# Patient Record
Sex: Female | Born: 1955 | Race: White | Hispanic: No | Marital: Married | Smoking: Current every day smoker
Health system: Southern US, Community
[De-identification: ages and names within clinical notes are randomized; demographics above are authoritative.]

## PROBLEM LIST (undated history)

## (undated) DIAGNOSIS — F17200 Nicotine dependence, unspecified, uncomplicated: Secondary | ICD-10-CM

## (undated) DIAGNOSIS — G709 Myoneural disorder, unspecified: Secondary | ICD-10-CM

## (undated) DIAGNOSIS — K529 Noninfective gastroenteritis and colitis, unspecified: Secondary | ICD-10-CM

## (undated) DIAGNOSIS — I81 Portal vein thrombosis: Secondary | ICD-10-CM

## (undated) DIAGNOSIS — C189 Malignant neoplasm of colon, unspecified: Secondary | ICD-10-CM

## (undated) DIAGNOSIS — Z8719 Personal history of other diseases of the digestive system: Secondary | ICD-10-CM

## (undated) DIAGNOSIS — F419 Anxiety disorder, unspecified: Secondary | ICD-10-CM

## (undated) DIAGNOSIS — N189 Chronic kidney disease, unspecified: Secondary | ICD-10-CM

## (undated) DIAGNOSIS — C55 Malignant neoplasm of uterus, part unspecified: Secondary | ICD-10-CM

## (undated) DIAGNOSIS — C801 Malignant (primary) neoplasm, unspecified: Secondary | ICD-10-CM

## (undated) DIAGNOSIS — R569 Unspecified convulsions: Secondary | ICD-10-CM

## (undated) DIAGNOSIS — F329 Major depressive disorder, single episode, unspecified: Secondary | ICD-10-CM

## (undated) DIAGNOSIS — D649 Anemia, unspecified: Secondary | ICD-10-CM

## (undated) DIAGNOSIS — F32A Depression, unspecified: Secondary | ICD-10-CM

## (undated) DIAGNOSIS — I10 Essential (primary) hypertension: Secondary | ICD-10-CM

## (undated) HISTORY — DX: Unspecified convulsions: R56.9

## (undated) HISTORY — DX: Myoneural disorder, unspecified: G70.9

## (undated) HISTORY — DX: Malignant (primary) neoplasm, unspecified: C80.1

## (undated) HISTORY — DX: Essential (primary) hypertension: I10

## (undated) HISTORY — PX: TONSILLECTOMY: SUR1361

## (undated) HISTORY — DX: Nicotine dependence, unspecified, uncomplicated: F17.200

---

## 1986-12-18 HISTORY — PX: ABDOMINAL HYSTERECTOMY: SHX81

## 2003-01-02 ENCOUNTER — Encounter: Payer: Self-pay | Admitting: Family Medicine

## 2003-01-02 ENCOUNTER — Ambulatory Visit (HOSPITAL_COMMUNITY): Admission: RE | Admit: 2003-01-02 | Discharge: 2003-01-02 | Payer: Self-pay | Admitting: Family Medicine

## 2003-02-03 ENCOUNTER — Encounter: Payer: Self-pay | Admitting: Emergency Medicine

## 2003-02-03 ENCOUNTER — Emergency Department (HOSPITAL_COMMUNITY): Admission: EM | Admit: 2003-02-03 | Discharge: 2003-02-03 | Payer: Self-pay | Admitting: Emergency Medicine

## 2004-12-18 HISTORY — PX: TUNNELED VENOUS PORT PLACEMENT: SHX819

## 2004-12-18 HISTORY — PX: COLON SURGERY: SHX602

## 2005-04-19 ENCOUNTER — Other Ambulatory Visit: Admission: RE | Admit: 2005-04-19 | Discharge: 2005-04-19 | Payer: Self-pay | Admitting: Family Medicine

## 2005-05-12 ENCOUNTER — Encounter: Admission: RE | Admit: 2005-05-12 | Discharge: 2005-05-12 | Payer: Self-pay | Admitting: Gastroenterology

## 2005-05-25 ENCOUNTER — Encounter (INDEPENDENT_AMBULATORY_CARE_PROVIDER_SITE_OTHER): Payer: Self-pay | Admitting: *Deleted

## 2005-05-25 ENCOUNTER — Inpatient Hospital Stay (HOSPITAL_COMMUNITY): Admission: RE | Admit: 2005-05-25 | Discharge: 2005-05-30 | Payer: Self-pay | Admitting: General Surgery

## 2005-06-16 ENCOUNTER — Ambulatory Visit: Payer: Self-pay | Admitting: Hematology and Oncology

## 2005-07-05 ENCOUNTER — Ambulatory Visit (HOSPITAL_BASED_OUTPATIENT_CLINIC_OR_DEPARTMENT_OTHER): Admission: RE | Admit: 2005-07-05 | Discharge: 2005-07-05 | Payer: Self-pay | Admitting: General Surgery

## 2005-07-05 ENCOUNTER — Ambulatory Visit (HOSPITAL_COMMUNITY): Admission: RE | Admit: 2005-07-05 | Discharge: 2005-07-05 | Payer: Self-pay | Admitting: General Surgery

## 2005-07-06 ENCOUNTER — Ambulatory Visit (HOSPITAL_COMMUNITY): Admission: RE | Admit: 2005-07-06 | Discharge: 2005-07-06 | Payer: Self-pay | Admitting: Hematology and Oncology

## 2005-08-07 ENCOUNTER — Ambulatory Visit: Payer: Self-pay | Admitting: Hematology and Oncology

## 2005-10-02 ENCOUNTER — Ambulatory Visit: Payer: Self-pay | Admitting: Hematology and Oncology

## 2005-11-24 ENCOUNTER — Ambulatory Visit: Payer: Self-pay | Admitting: Hematology and Oncology

## 2006-01-09 ENCOUNTER — Ambulatory Visit: Payer: Self-pay | Admitting: Hematology and Oncology

## 2006-03-14 ENCOUNTER — Ambulatory Visit: Payer: Self-pay | Admitting: Hematology and Oncology

## 2006-03-23 LAB — CBC WITH DIFFERENTIAL/PLATELET
BASO%: 1.5 % (ref 0.0–2.0)
Basophils Absolute: 0.1 10*3/uL (ref 0.0–0.1)
EOS%: 5.4 % (ref 0.0–7.0)
Eosinophils Absolute: 0.3 10*3/uL (ref 0.0–0.5)
HCT: 43.6 % (ref 34.8–46.6)
HGB: 15.3 g/dL (ref 11.6–15.9)
LYMPH%: 38.9 % (ref 14.0–48.0)
MCH: 34.5 pg — ABNORMAL HIGH (ref 26.0–34.0)
MCHC: 35.1 g/dL (ref 32.0–36.0)
MCV: 98.3 fL (ref 81.0–101.0)
MONO#: 0.8 10*3/uL (ref 0.1–0.9)
MONO%: 13.4 % — ABNORMAL HIGH (ref 0.0–13.0)
NEUT#: 2.3 10*3/uL (ref 1.5–6.5)
NEUT%: 40.8 % (ref 39.6–76.8)
Platelets: 270 10*3/uL (ref 145–400)
RBC: 4.43 10*6/uL (ref 3.70–5.32)
RDW: 18.4 % — ABNORMAL HIGH (ref 11.3–14.5)
WBC: 5.7 10*3/uL (ref 3.9–10.0)
lymph#: 2.2 10*3/uL (ref 0.9–3.3)

## 2006-03-23 LAB — COMPREHENSIVE METABOLIC PANEL
ALT: 65 U/L — ABNORMAL HIGH (ref 0–40)
Albumin: 4.4 g/dL (ref 3.5–5.2)
CO2: 24 mEq/L (ref 19–32)
Calcium: 9.3 mg/dL (ref 8.4–10.5)
Chloride: 100 mEq/L (ref 96–112)
Potassium: 3.4 mEq/L — ABNORMAL LOW (ref 3.5–5.3)
Sodium: 135 mEq/L (ref 135–145)
Total Protein: 6.8 g/dL (ref 6.0–8.3)

## 2006-03-23 LAB — CEA: CEA: 2.3 ng/mL (ref 0.0–5.0)

## 2006-03-30 ENCOUNTER — Ambulatory Visit (HOSPITAL_COMMUNITY): Admission: RE | Admit: 2006-03-30 | Discharge: 2006-03-30 | Payer: Self-pay | Admitting: Hematology and Oncology

## 2006-05-02 ENCOUNTER — Ambulatory Visit: Payer: Self-pay | Admitting: Hematology and Oncology

## 2006-05-08 ENCOUNTER — Emergency Department (HOSPITAL_COMMUNITY): Admission: EM | Admit: 2006-05-08 | Discharge: 2006-05-08 | Payer: Self-pay | Admitting: Emergency Medicine

## 2006-06-12 ENCOUNTER — Ambulatory Visit: Payer: Self-pay | Admitting: Hematology and Oncology

## 2006-07-16 ENCOUNTER — Ambulatory Visit: Payer: Self-pay | Admitting: Hematology and Oncology

## 2006-08-15 LAB — CBC WITH DIFFERENTIAL/PLATELET
BASO%: 0.5 % (ref 0.0–2.0)
Eosinophils Absolute: 0.3 10*3/uL (ref 0.0–0.5)
HCT: 42.2 % (ref 34.8–46.6)
LYMPH%: 23 % (ref 14.0–48.0)
MCHC: 35.4 g/dL (ref 32.0–36.0)
MCV: 103.1 fL — ABNORMAL HIGH (ref 81.0–101.0)
MONO%: 8.1 % (ref 0.0–13.0)
NEUT%: 63 % (ref 39.6–76.8)
Platelets: 246 10*3/uL (ref 145–400)
RBC: 4.1 10*6/uL (ref 3.70–5.32)

## 2006-08-15 LAB — COMPREHENSIVE METABOLIC PANEL
Albumin: 4.8 g/dL (ref 3.5–5.2)
BUN: 11 mg/dL (ref 6–23)
CO2: 26 mEq/L (ref 19–32)
Glucose, Bld: 123 mg/dL — ABNORMAL HIGH (ref 70–99)
Sodium: 137 mEq/L (ref 135–145)
Total Bilirubin: 0.9 mg/dL (ref 0.3–1.2)
Total Protein: 7.5 g/dL (ref 6.0–8.3)

## 2006-08-15 LAB — CEA: CEA: 2.6 ng/mL (ref 0.0–5.0)

## 2006-11-01 ENCOUNTER — Ambulatory Visit: Payer: Self-pay | Admitting: Hematology and Oncology

## 2006-11-05 LAB — CBC WITH DIFFERENTIAL/PLATELET
BASO%: 0.5 % (ref 0.0–2.0)
EOS%: 2.7 % (ref 0.0–7.0)
Eosinophils Absolute: 0.3 10*3/uL (ref 0.0–0.5)
LYMPH%: 19.2 % (ref 14.0–48.0)
MCH: 35 pg — ABNORMAL HIGH (ref 26.0–34.0)
MCHC: 34.9 g/dL (ref 32.0–36.0)
MCV: 100.4 fL (ref 81.0–101.0)
MONO%: 11.9 % (ref 0.0–13.0)
NEUT#: 7.3 10*3/uL — ABNORMAL HIGH (ref 1.5–6.5)
RBC: 4.13 10*6/uL (ref 3.70–5.32)
RDW: 14.9 % — ABNORMAL HIGH (ref 11.3–14.5)

## 2006-11-05 LAB — COMPREHENSIVE METABOLIC PANEL
ALT: 28 U/L (ref 0–35)
AST: 26 U/L (ref 0–37)
Albumin: 4.7 g/dL (ref 3.5–5.2)
Alkaline Phosphatase: 249 U/L — ABNORMAL HIGH (ref 39–117)
Potassium: 3.2 mEq/L — ABNORMAL LOW (ref 3.5–5.3)
Sodium: 136 mEq/L (ref 135–145)
Total Bilirubin: 0.7 mg/dL (ref 0.3–1.2)
Total Protein: 7.5 g/dL (ref 6.0–8.3)

## 2006-12-22 ENCOUNTER — Emergency Department (HOSPITAL_COMMUNITY): Admission: EM | Admit: 2006-12-22 | Discharge: 2006-12-23 | Payer: Self-pay | Admitting: Emergency Medicine

## 2006-12-28 ENCOUNTER — Ambulatory Visit (HOSPITAL_COMMUNITY): Admission: RE | Admit: 2006-12-28 | Discharge: 2006-12-28 | Payer: Self-pay | Admitting: Emergency Medicine

## 2007-01-25 ENCOUNTER — Encounter: Admission: RE | Admit: 2007-01-25 | Discharge: 2007-01-25 | Payer: Self-pay | Admitting: Neurology

## 2007-04-05 ENCOUNTER — Ambulatory Visit: Payer: Self-pay | Admitting: Hematology and Oncology

## 2007-04-16 ENCOUNTER — Inpatient Hospital Stay (HOSPITAL_COMMUNITY): Admission: EM | Admit: 2007-04-16 | Discharge: 2007-04-17 | Payer: Self-pay | Admitting: Emergency Medicine

## 2008-10-06 ENCOUNTER — Ambulatory Visit (HOSPITAL_COMMUNITY): Admission: RE | Admit: 2008-10-06 | Discharge: 2008-10-06 | Payer: Self-pay | Admitting: Family Medicine

## 2009-02-16 ENCOUNTER — Emergency Department (HOSPITAL_COMMUNITY): Admission: EM | Admit: 2009-02-16 | Discharge: 2009-02-16 | Payer: Self-pay | Admitting: Emergency Medicine

## 2009-03-11 ENCOUNTER — Ambulatory Visit: Payer: Self-pay | Admitting: Hematology and Oncology

## 2009-03-11 LAB — CBC WITH DIFFERENTIAL/PLATELET
Basophils Absolute: 0.1 10*3/uL (ref 0.0–0.1)
Eosinophils Absolute: 0.6 10*3/uL — ABNORMAL HIGH (ref 0.0–0.5)
HGB: 14.7 g/dL (ref 11.6–15.9)
LYMPH%: 20 % (ref 14.0–49.7)
MONO#: 1.4 10*3/uL — ABNORMAL HIGH (ref 0.1–0.9)
NEUT#: 6.7 10*3/uL — ABNORMAL HIGH (ref 1.5–6.5)
Platelets: 208 10*3/uL (ref 145–400)
RBC: 4.43 10*6/uL (ref 3.70–5.45)
WBC: 10.9 10*3/uL — ABNORMAL HIGH (ref 3.9–10.3)

## 2009-03-11 LAB — COMPREHENSIVE METABOLIC PANEL
AST: 73 U/L — ABNORMAL HIGH (ref 0–37)
Albumin: 4.4 g/dL (ref 3.5–5.2)
BUN: 11 mg/dL (ref 6–23)
Calcium: 9.5 mg/dL (ref 8.4–10.5)
Chloride: 105 mEq/L (ref 96–112)
Potassium: 3.9 mEq/L (ref 3.5–5.3)
Sodium: 139 mEq/L (ref 135–145)
Total Protein: 7.3 g/dL (ref 6.0–8.3)

## 2009-11-26 ENCOUNTER — Ambulatory Visit: Payer: Self-pay | Admitting: Oncology

## 2010-03-21 ENCOUNTER — Ambulatory Visit: Payer: Self-pay | Admitting: Oncology

## 2010-03-23 ENCOUNTER — Ambulatory Visit: Payer: Self-pay | Admitting: Hematology and Oncology

## 2010-03-23 LAB — CBC WITH DIFFERENTIAL/PLATELET
BASO%: 0.5 % (ref 0.0–2.0)
Basophils Absolute: 0.1 10*3/uL (ref 0.0–0.1)
EOS%: 3.5 % (ref 0.0–7.0)
HCT: 46.9 % — ABNORMAL HIGH (ref 34.8–46.6)
HGB: 16.3 g/dL — ABNORMAL HIGH (ref 11.6–15.9)
LYMPH%: 30.4 % (ref 14.0–49.7)
MCH: 35.1 pg — ABNORMAL HIGH (ref 25.1–34.0)
MCHC: 34.7 g/dL (ref 31.5–36.0)
MCV: 101 fL (ref 79.5–101.0)
MONO%: 7.8 % (ref 0.0–14.0)
NEUT%: 57.8 % (ref 38.4–76.8)
Platelets: 245 10*3/uL (ref 145–400)

## 2010-03-23 LAB — COMPREHENSIVE METABOLIC PANEL
ALT: 30 U/L (ref 0–35)
AST: 30 U/L (ref 0–37)
BUN: 9 mg/dL (ref 6–23)
Calcium: 9.6 mg/dL (ref 8.4–10.5)
Chloride: 103 mEq/L (ref 96–112)
Creatinine, Ser: 0.95 mg/dL (ref 0.40–1.20)
Total Bilirubin: 0.3 mg/dL (ref 0.3–1.2)

## 2010-03-29 ENCOUNTER — Ambulatory Visit (HOSPITAL_COMMUNITY): Admission: RE | Admit: 2010-03-29 | Discharge: 2010-03-29 | Payer: Self-pay | Admitting: Hematology and Oncology

## 2010-12-18 HISTORY — PX: OTHER SURGICAL HISTORY: SHX169

## 2010-12-18 HISTORY — PX: CLAVICLE SURGERY: SHX598

## 2011-01-07 ENCOUNTER — Encounter: Payer: Self-pay | Admitting: Hematology and Oncology

## 2011-01-08 ENCOUNTER — Encounter: Payer: Self-pay | Admitting: Hematology and Oncology

## 2011-01-08 ENCOUNTER — Encounter: Payer: Self-pay | Admitting: Family Medicine

## 2011-01-24 IMAGING — CT CT ABD-PELV W/ CM
2 of 6 series · 16 of 46 positions shown, 18 images · IV contrast (agent unspecified)
Comparison: Abdominal/pelvic CTs of 02/16/2009.  CT chest of
03/30/2006.

CT CHEST

CLINICAL DATA: Colon cancer with chemotherapy completed in 7223.
Uterine cancer over 20 years ago.  Cough.  Shortness of breath.

CT CHEST, ABDOMEN AND PELVIS WITH CONTRAST
TECHNIQUE: Contiguous axial images of the chest abdomen and pelvis
were obtained after IV contrast administration.
Contrast: 100  ml 4mnipaque-ZSS

[Series 2: cap with st · axial · 0.73mm/px · z∈[-558,-62]mm · 13 of 115 slices shown, 15 images]
[im 8/115  soft-tissue]
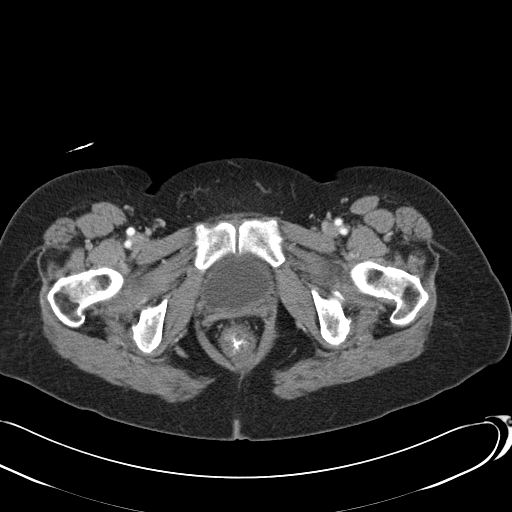
[im 8/115  bone]
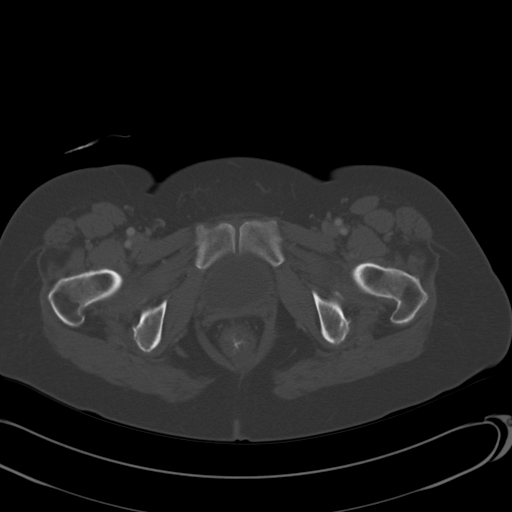
[im 15/115  soft-tissue]
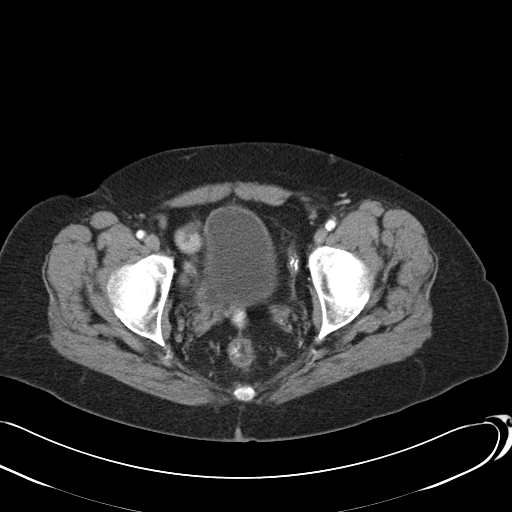
[im 22/115  soft-tissue]
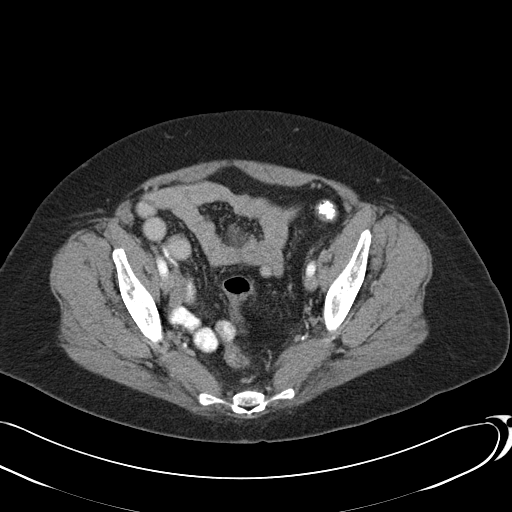
[im 36/115  soft-tissue]
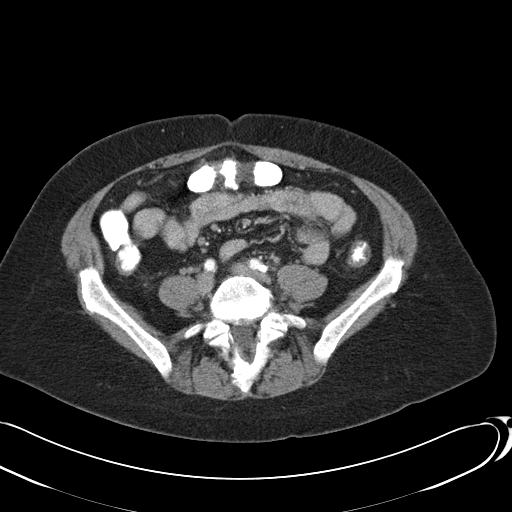
[im 43/115  soft-tissue]
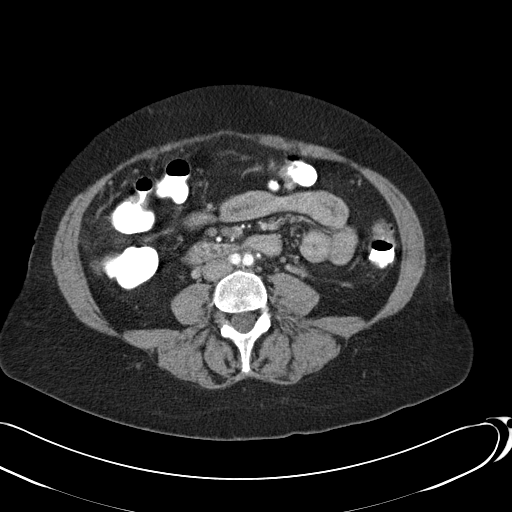
[im 50/115  soft-tissue]
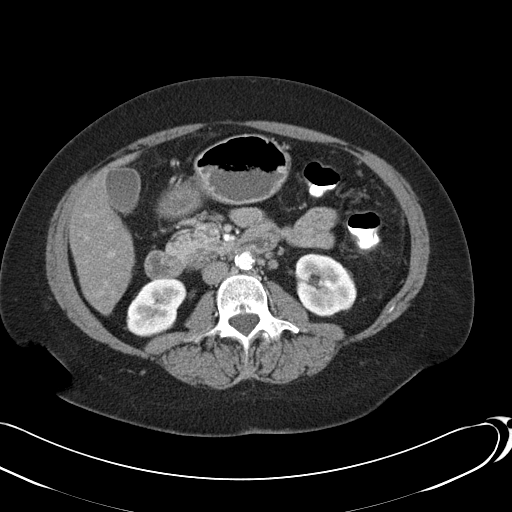
[im 58/115  soft-tissue]
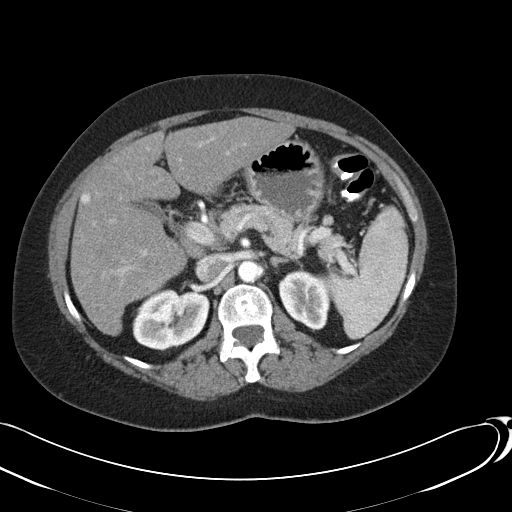
[im 65/115  soft-tissue]
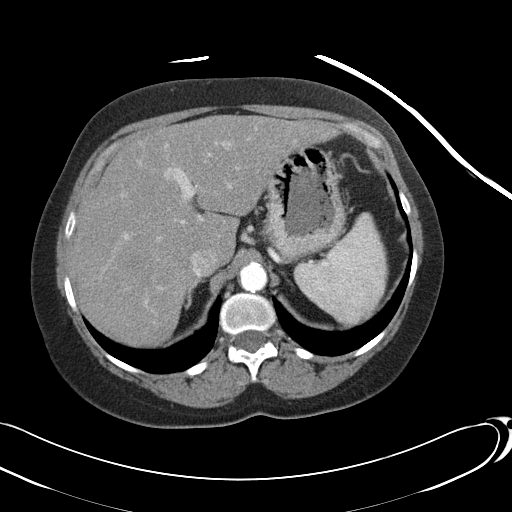
[im 72/115  soft-tissue]
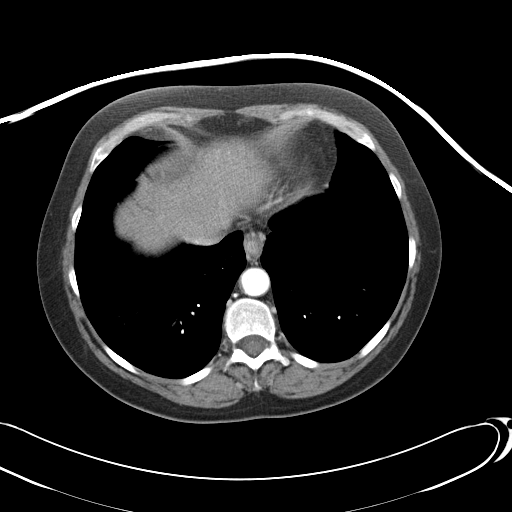
[im 72/115  bone]
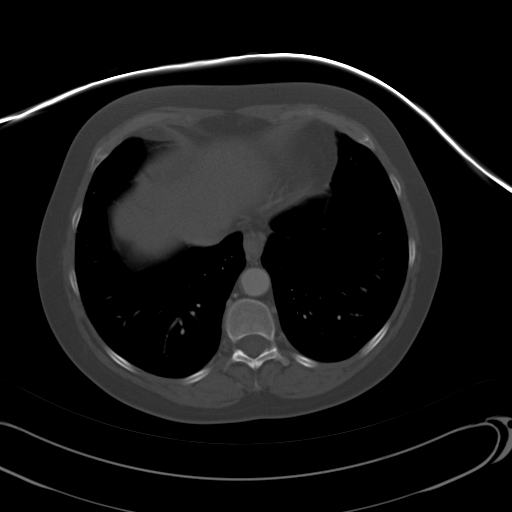
[im 79/115  soft-tissue]
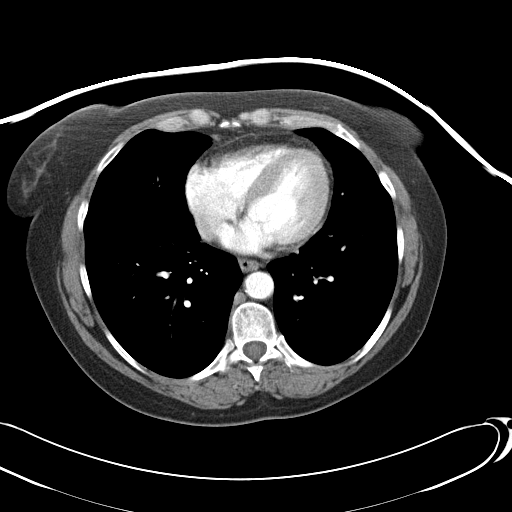
[im 93/115  soft-tissue]
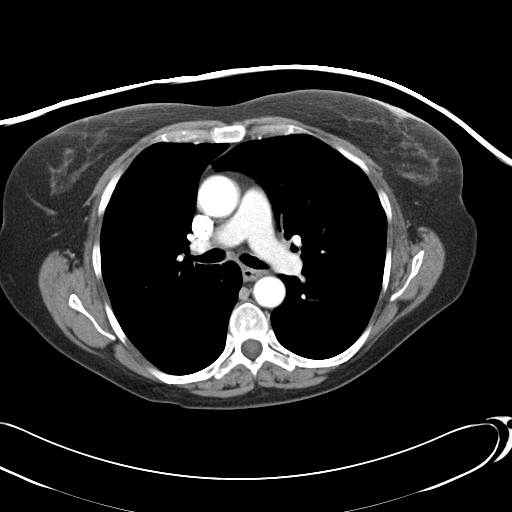
[im 100/115  soft-tissue]
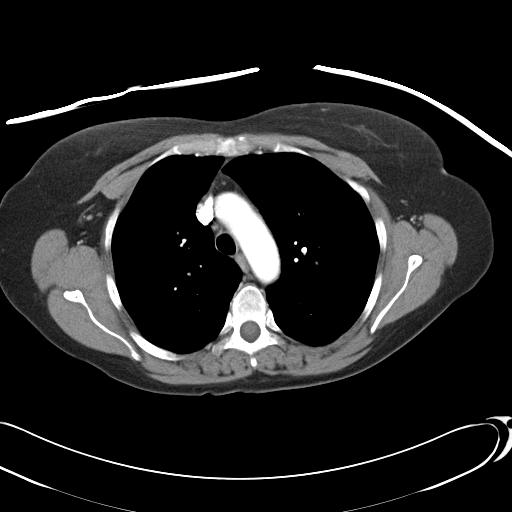
[im 107/115  soft-tissue]
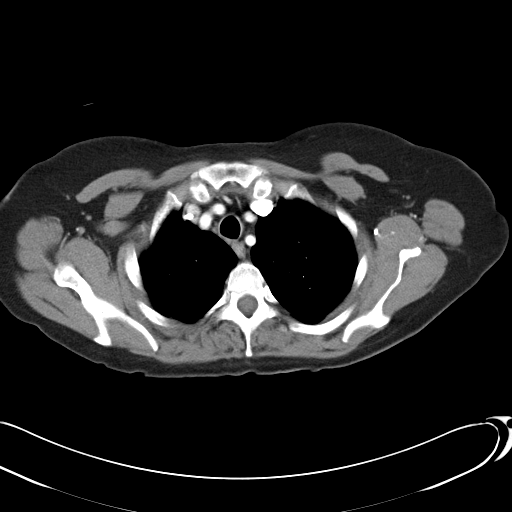

[Series 602: coronal images · coronal · 1.12mm/px · 3 of 85 slices shown]
[im 29/85  soft-tissue]
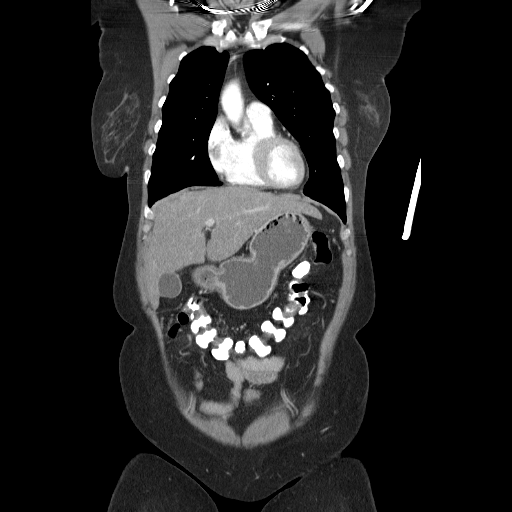
[im 38/85  soft-tissue]
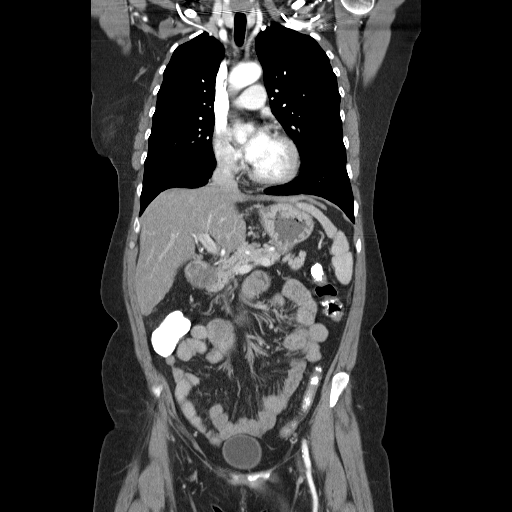
[im 47/85  soft-tissue]
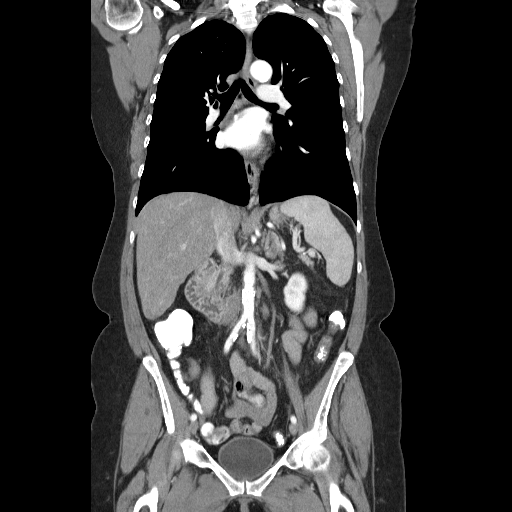

[16 of 46 positions shown; findings below may reference images not displayed]

FINDINGS: Lung windows demonstrate no nodules or airspace
opacities.  The subtle left apical ground-glass nodule described on
the 03/30/2006 exam is not identified and has likely resolved.

Soft tissue windows demonstrate a right-sided Port-A-Cath which
terminates at the SVC.

Mild aortic and branch vessel atherosclerosis. Normal heart size
without pericardial or pleural effusion.  Locules of air in the
pulmonary outflow tract are likely due to injection. No mediastinal
or hilar adenopathy.
IMPRESSION: No acute process or evidence of metastatic disease in the chest.

CT ABDOMEN AND PELVIS
FINDINGS: Mild fatty infiltration of the liver.  6 mm area of
hyperenhancement in the subcapsular anterior right hepatic lobe on
image 58 of series 2.  Not present on nephrographic phase images
nor on the 03/30/2006 CT.

Normal spleen, stomach.  A calcification in the pancreatic head is
felt to be positioned separate from the pancreatic or common duct,
given absence of dilatation.  Image 65.  Similar to 02/16/2009.
New since 03/30/2006. No gallstones or biliary ductal dilatation.

Normal adrenal glands and kidneys.

No retroperitoneal or retrocrural adenopathy.

Normal colon and terminal ileum.  Appendix is not visualized but
there is no evidence of right lower quadrant inflammation.  Normal
small bowel without ascites.

No pelvic adenopathy.    Normal urinary bladder.  Hysterectomy.  No
adnexal mass.  No significant free fluid.  Bilateral L5 pars
defects.  Grade 1 L5- S1 anterolisthesis.

Apparent anal wall thickening including on image 107 is felt to be
due to underdistension.
IMPRESSION: 1. Fatty infiltration of the liver with a new focus of subcapsular
right hepatic lobe hyperenhancement.  Not typical of colorectal
cancer metastasis.  Considerations include a perfusion anomaly or
hemangioma (although this would have been expected to be present on
prior exams).  This could either be reevaluated at follow-up or
further evaluated with contrast enhanced MRI.
2.  Otherwise, no evidence of metastatic disease or acute process
in the abdomen/pelvis.
3.  Likely artifactual apparent anal wall thickening due to
underdistension.  Correlate with any symptoms that might suggest
proctitis.
5.  Similar appearance of calcification in the  pancreatic head.
This could be in the pancreatic duct or surrounding parenchyma.
Absence of common duct or pancreatic duct dilatation argues for a
parenchymal position.

## 2011-03-30 LAB — CBC
HCT: 46.6 % — ABNORMAL HIGH (ref 36.0–46.0)
Hemoglobin: 16.2 g/dL — ABNORMAL HIGH (ref 12.0–15.0)
MCV: 97.6 fL (ref 78.0–100.0)
RDW: 13.9 % (ref 11.5–15.5)
WBC: 25 10*3/uL — ABNORMAL HIGH (ref 4.0–10.5)

## 2011-03-30 LAB — BASIC METABOLIC PANEL
BUN: 17 mg/dL (ref 6–23)
CO2: 23 mEq/L (ref 19–32)
Chloride: 106 mEq/L (ref 96–112)
Creatinine, Ser: 0.85 mg/dL (ref 0.4–1.2)
Potassium: 4 mEq/L (ref 3.5–5.1)

## 2011-03-30 LAB — URINALYSIS, ROUTINE W REFLEX MICROSCOPIC
Bilirubin Urine: NEGATIVE
Glucose, UA: NEGATIVE mg/dL
Specific Gravity, Urine: >1.03 — ABNORMAL HIGH (ref 1.005–1.030)

## 2011-03-30 LAB — HEPATIC FUNCTION PANEL
AST: 48 U/L — ABNORMAL HIGH (ref 0–37)
Albumin: 4.3 g/dL (ref 3.5–5.2)
Total Protein: 7.2 g/dL (ref 6.0–8.3)

## 2011-03-30 LAB — DIFFERENTIAL
Eosinophils Relative: 0 % (ref 0–5)
Lymphs Abs: 0.8 10*3/uL (ref 0.7–4.0)
Monocytes Absolute: 1.3 10*3/uL — ABNORMAL HIGH (ref 0.1–1.0)
Monocytes Relative: 5 % (ref 3–12)
Neutro Abs: 22.9 10*3/uL — ABNORMAL HIGH (ref 1.7–7.7)

## 2011-03-30 LAB — URINE MICROSCOPIC-ADD ON

## 2011-05-05 NOTE — H&P (Signed)
NAME:  Laura Farley, AYON NO.:  [id number]   MEDICAL RECORD NO.:  [id number]          PATIENT TYPE:  EMS   LOCATION:  ED                           [facility]ACILITY[facility]   PHYSICIAN:  Herbie Saxon, MDDATE OF BIRTH:  Feb 07, 1956   DATE OF ADMISSION:  04/15/2007  DATE OF DISCHARGE:                              HISTORY & PHYSICAL   Primary care physician is Dr. Collins Scotland of [facility].   PRESENTING COMPLAINT:  Tonic/clonic seizure activity at 6 p.m. today,  lip bleeding, reduced level of consciousness, headache.   HISTORY OF PRESENTING COMPLAINT:  This is a 55 year old Caucasian lady  who was brought by EMS after she started having tonic/clonic convulsive  episode around 5:30 p.m. today.  Her husband noticed generalized body  jerks, with postictal drowsiness and confusion.  The patient also had  tongue biting but there was no urine or fecal incontinence.  The patient  is more alert.  Last seizure episode was 6 months ago.  She complains of  frontal headache, lower abdominal discomfort and some mild dysuria.  She  denies low-grade fever for the preceding 1 week.  The patient has  recently been drinking very heavily because her son is locked up in  jail, just been re-arrested.  The patient does claim that she smokes  about 3-4 bags of marijuana a week and she smokes 1/2 pack-per-day for  greater than 35 years.   PAST SURGICAL HISTORY:  1. Colectomy 2005.  2. Partial hysterectomy 1998.   PAST MEDICAL HISTORY:  1. Cancer of the uterus.  2. Cancer of the colon.  3. Seizure disorder.  4. Anxiety.  5. Hypertension.   FAMILY HISTORY:  Noncontributory.   MEDICATIONS:  1. Lisinopril 1 daily.  2. HCTZ 1 daily.  3. prilosec  20 mg daily.   ALLERGIES:  No known drug allergies.   REVIEW OF SYSTEMS:  Twelve systems reviewed, positive as above.  The  patient denies any respiratory, cardiovascular, musculoskeletal  symptoms.   PHYSICAL EXAMINATION:   GENERAL:  She is an elderly lady unkempt.  VITAL SIGNS:  Temperature 97, pulse 117, respiratory rate 20, blood  pressure 136/76.  HEENT:  Pupils reactive to light and accommodation.  NEUROLOGIC:  Cranial nerves I-XII are intact.  __________ .  NECK:  Supple.  Deep tendon reflexes 2+ globally.  Sensation is normal.  clear.  HEART:  Heart sounds 1 and 2 tachycardic.  ABDOMEN:  no  tenderness, has no organomegaly.  Bowel sounds are  normoactive.  EXTREMITIES:  Peripheral pulses present.  No pedal edema.   Available labs with WBC of 20.5, hematocrit 45, platelet count 269.  Chemistry shows a sodium 130, potassium 2.9, chloride 90, bicarbonate  22, BUN 10, creatinine 1, glucose 104.  Alcohol level is 5.  Urine tox  is positive for tetrahydrocannabinol.   ASSESSMENT:  1. Altered mental status, tonic/clonic seizure activity rule out      alcohol induced  seizure.  2. Hyponatremia.  3. Hypokalemia.  4. Anxiety disorder.  5. Hypertension.  6. urinary tract infection.  7. Tobacco, alcohol and  marijuana abuse.  The patient to be admitted to a telemetry bed, to be on  Seizure, fall  and aspiration precaution.  Diet to be heart healthy 2 g sodium.  She is  to have free fluid restriction  .  She is to be on IV fluid hydration.  Check her calcium,  phosphate level.  Check her PT/PTT/INR.  Blood  culture,  urine culture, chest x-ray, EKG and check her urine, sodium,  potassium, creatinine  CK MB, troponin x1.  Routine CT brain.  No  contrast.  SCD boot  for DVT prophylaxis,  5 mg IV q. 8 hours p.r.n.,  enteric coated aspirin 81 mg daily, Protonix 40 mg IV daily, Xopenex via  nebs q. 6 hours p.r.n., Tylenol 650 mg q. 4 p.r.n., Senokot 1 q. h.s.,  Rocephin 1 g IV daily, CBC, BMP in the morning, Dilantin load 1 g stat  then 100mg  p.o. t.i.d., Librium 25 mg p.o. t.i.d.,  IV KCl 40 mEq over 4  hours p.o., K Cl 20 mEq daily .  Hold HCTZ and continue lisinopril 5 mg  daily, Natrecor 25 mg daily.  THE  PATIENT IS A FULL CODE.   Time is 45 minutes.      Herbie Saxon, MD  Electronically Signed     MIO/MEDQ  D:  04/16/2007  T:  04/16/2007  Job:  [id number]

## 2011-05-05 NOTE — Op Note (Signed)
NAME:  Laura Farley, Laura Farley                ACCOUNT NO.:  [id number]   MEDICAL RECORD NO.:  [id number]          PATIENT TYPE:  AMB   LOCATION:  NESC                         FACILITY:  [facility]   PHYSICIAN:  Leonie Man, M.D.   DATE OF BIRTH:  06-23-1956   DATE OF PROCEDURE:  07/05/2005  DATE OF DISCHARGE:                                 OPERATIVE REPORT   PREOPERATIVE DIAGNOSES:  1.  Poor venous access.  2.  Status post colon cancer.   POSTOPERATIVE DIAGNOSES:  1.  Poor venous access.  2.  Status post colon cancer.   PROCEDURE:  Port-A-Cath placement, Bard port [id number].   SURGEON:  Leonie Man, M.D.   ASSISTANT:  OR tech.   ANESTHESIA:  MAC, used 1 percent lidocaine plain and 0.5% Marcaine with  epinephrine.   No specimens. Blood loss was minimal. Complications none apparent.  The patient removed to the PACU in excellent condition.   Note, the patient is a 55 year old female status post excision of a T3  carcinoma of the right colon now being prepared for chemotherapy. She comes  to the operating room for placement of a Port-A-Cath so as to facilitate her  chemotherapy.   DESCRIPTION OF PROCEDURE:  The patient is positioned supinely and then  placed in the Trendelenburg position, head and neck turned to the right. The  anterior neck and chest were prepped and draped to be included in the  sterile operative field. Following adequate sedation, the right subclavian  region was infiltrated with plain lidocaine and a subclavian stick is made  into the right subclavian vein and a guidewire threaded into the superior  vena cava under fluoroscopic guidance. I then created a pocket at the right  anterior chest wall and from the pocket threaded the Silastic catheter up to  the shoulder wound I used a Cook type introducer dilator over the guidewire  and positioned this under fluoroscopic guidance in the superior vena cava.  The dilator and guidewire were removed and the Silastic catheter  threaded  into the introducer and positioned at the atrial vena caval junction. Inflow  of heparinized saline and blood return were noted to be excellent. I then  positioned the Port-A-Cath reservoir at the exterior end of the of the  Silastic catheter and secured it to the reservoir and again inflow of  heparinized saline and blood return were noted to be excellent. The Port-A-  Cath reservoir was then secured into the pocket with 2-0 silk sutures. Final  fluoroscopic evaluation shows the Port-A-Cath tip to be at the vena cava and  atrial junction. The there were no kinks, bends or unusual turns in the  course of the Port-A-Cath and there was free flow of heparinized saline into  the Port-A-Cath and excellent blood return. Sponge and instrument counts  were then verified. The subcutaneous tissues of both the shoulder wound and  the anterior chest wall wound were closed with interrupted sutures of 3-0  Vicryl. The skin was closed with running intradermal suture of 4-0 Monocryl.  The wound was then reinforced with Steri-Strips. Sterile dressings applied  after concentrated heparin was placed into the reservoir. The patient was  then removed from the operating room to the recovery room in stable  condition. She tolerated the procedure well.       PB/MEDQ  D:  07/05/2005  T:  07/05/2005  Job:  [id number]   cc:   Vicente Serene I. Odogwu, M.D.  Fax: [phone]   Jordan Hawks. Elnoria Howard, MD  Fax: [phone]

## 2011-05-05 NOTE — Discharge Summary (Signed)
NAME:  NIALAH, SARAVIA                ACCOUNT NO.:  [id number]   MEDICAL RECORD NO.:  [id number]          PATIENT TYPE:  INP   LOCATION:  5708                         FACILITY:  [facility]   PHYSICIAN:  Leonie Man, M.D.   DATE OF BIRTH:  Aug 05, 1956   DATE OF ADMISSION:  05/25/2005  DATE OF DISCHARGE:  05/30/2005                                 DISCHARGE SUMMARY   ADMISSION DIAGNOSIS:  Carcinoma of ascending colon.   DISCHARGE DIAGNOSIS:  Carcinoma of ascending colon.   PROCEDURE IN HOSPITAL:  Right hemicolectomy, pathology showing a T3, N0, M0  tumor with penetration to subserosa.  Margins are clear, lymphovascular  invasion involved.   POSTOPERATIVE COMPLICATIONS:  None.   CONDITION ON DISCHARGE:  Improved.   FOLLOWUP:  Scheduled followup 2 weeks postop.  There are plans for followup  oncology consultation in approximately 2 weeks.   DIET:  No restrictions.   MEDICATIONS:  Vicodin p.r.n.  The patient was instructed to resume her usual  medications at home which include Paxil and benazepril/hydrochlorothiazide.   HOSPITAL NOTE:  Ms. Awad is a 55 year old female presenting originally  with hematochezia and on colonoscopy, noted to have a partially constricting  tumor of the ascending colon.  She was admitted to the hospital following  bowel prep and taken to the operating room, where she underwent right-sided  hemicolectomy with primary ileocolic anastomosis.  Her postoperative course  has been benign with normal resumption of diet and activity.  The patient  had to use a nicotine patch while in hospital because of her smoking  history.  She is being discharged now to be followed up in the office in 2  weeks, medications as noted.       PB/MEDQ  D:  05/30/2005  T:  05/30/2005  Job:  [id number]

## 2011-06-07 ENCOUNTER — Other Ambulatory Visit (HOSPITAL_COMMUNITY): Payer: Self-pay | Admitting: Internal Medicine

## 2011-06-07 DIAGNOSIS — R748 Abnormal levels of other serum enzymes: Secondary | ICD-10-CM

## 2011-06-07 DIAGNOSIS — R945 Abnormal results of liver function studies: Secondary | ICD-10-CM

## 2011-06-12 ENCOUNTER — Ambulatory Visit (HOSPITAL_COMMUNITY): Payer: Self-pay

## 2011-06-12 ENCOUNTER — Ambulatory Visit (HOSPITAL_COMMUNITY)
Admission: RE | Admit: 2011-06-12 | Discharge: 2011-06-12 | Disposition: A | Discharge status: Home / Self Care | Payer: Medicare Other | Source: Ambulatory Visit | Attending: Internal Medicine | Admitting: Internal Medicine

## 2011-06-12 ENCOUNTER — Encounter (HOSPITAL_COMMUNITY)
Admission: RE | Admit: 2011-06-12 | Discharge: 2011-06-12 | Disposition: A | Discharge status: Home / Self Care | Payer: Medicare Other | Source: Ambulatory Visit | Attending: Internal Medicine | Admitting: Internal Medicine

## 2011-06-12 DIAGNOSIS — Z85038 Personal history of other malignant neoplasm of large intestine: Secondary | ICD-10-CM | POA: Insufficient documentation

## 2011-06-12 DIAGNOSIS — Z8542 Personal history of malignant neoplasm of other parts of uterus: Secondary | ICD-10-CM | POA: Insufficient documentation

## 2011-06-12 DIAGNOSIS — R748 Abnormal levels of other serum enzymes: Secondary | ICD-10-CM

## 2011-06-12 DIAGNOSIS — R945 Abnormal results of liver function studies: Secondary | ICD-10-CM

## 2011-06-12 MED ORDER — TECHNETIUM TC 99M MEDRONATE IV KIT
25.0000 | PACK | Freq: Once | INTRAVENOUS | Status: AC | PRN
  Administered 2011-06-12: 25 via INTRAVENOUS

## 2011-06-25 ENCOUNTER — Emergency Department (HOSPITAL_COMMUNITY): Payer: Medicare Other

## 2011-06-25 ENCOUNTER — Emergency Department (HOSPITAL_COMMUNITY)
Admission: EM | Admit: 2011-06-25 | Discharge: 2011-06-25 | Disposition: A | Discharge status: Home / Self Care | Payer: Medicare Other | Attending: Emergency Medicine | Admitting: Emergency Medicine

## 2011-06-25 DIAGNOSIS — S42209A Unspecified fracture of upper end of unspecified humerus, initial encounter for closed fracture: Secondary | ICD-10-CM | POA: Insufficient documentation

## 2011-06-25 DIAGNOSIS — S43006A Unspecified dislocation of unspecified shoulder joint, initial encounter: Secondary | ICD-10-CM | POA: Insufficient documentation

## 2011-06-25 DIAGNOSIS — Y93K1 Activity, walking an animal: Secondary | ICD-10-CM | POA: Insufficient documentation

## 2011-06-25 DIAGNOSIS — I10 Essential (primary) hypertension: Secondary | ICD-10-CM | POA: Insufficient documentation

## 2011-06-25 DIAGNOSIS — W010XXA Fall on same level from slipping, tripping and stumbling without subsequent striking against object, initial encounter: Secondary | ICD-10-CM | POA: Insufficient documentation

## 2011-06-30 ENCOUNTER — Encounter (HOSPITAL_BASED_OUTPATIENT_CLINIC_OR_DEPARTMENT_OTHER)
Admission: RE | Admit: 2011-06-30 | Discharge: 2011-06-30 | Disposition: A | Discharge status: Home / Self Care | Payer: Medicare Other | Source: Ambulatory Visit | Attending: Orthopedic Surgery | Admitting: Orthopedic Surgery

## 2011-06-30 LAB — POCT I-STAT, CHEM 8
BUN: 4 mg/dL — ABNORMAL LOW (ref 6–23)
Chloride: 100 mEq/L (ref 96–112)
Creatinine, Ser: 0.7 mg/dL (ref 0.50–1.10)
Hemoglobin: 14.3 g/dL (ref 12.0–15.0)
Potassium: 3.7 mEq/L (ref 3.5–5.1)
Sodium: 138 mEq/L (ref 135–145)

## 2011-07-04 ENCOUNTER — Ambulatory Visit (HOSPITAL_COMMUNITY): Payer: Medicare Other | Attending: Orthopedic Surgery

## 2011-07-04 ENCOUNTER — Ambulatory Visit (HOSPITAL_BASED_OUTPATIENT_CLINIC_OR_DEPARTMENT_OTHER)
Admission: RE | Admit: 2011-07-04 | Discharge: 2011-07-04 | Disposition: A | Discharge status: Home / Self Care | Payer: Medicare Other | Source: Ambulatory Visit | Attending: Orthopedic Surgery | Admitting: Orthopedic Surgery

## 2011-07-04 DIAGNOSIS — J4489 Other specified chronic obstructive pulmonary disease: Secondary | ICD-10-CM | POA: Insufficient documentation

## 2011-07-04 DIAGNOSIS — Z01812 Encounter for preprocedural laboratory examination: Secondary | ICD-10-CM | POA: Insufficient documentation

## 2011-07-04 DIAGNOSIS — M25519 Pain in unspecified shoulder: Secondary | ICD-10-CM | POA: Insufficient documentation

## 2011-07-04 DIAGNOSIS — F17200 Nicotine dependence, unspecified, uncomplicated: Secondary | ICD-10-CM | POA: Insufficient documentation

## 2011-07-04 DIAGNOSIS — J449 Chronic obstructive pulmonary disease, unspecified: Secondary | ICD-10-CM | POA: Insufficient documentation

## 2011-07-04 DIAGNOSIS — G589 Mononeuropathy, unspecified: Secondary | ICD-10-CM | POA: Insufficient documentation

## 2011-07-04 DIAGNOSIS — I10 Essential (primary) hypertension: Secondary | ICD-10-CM | POA: Insufficient documentation

## 2011-07-04 DIAGNOSIS — Z01810 Encounter for preprocedural cardiovascular examination: Secondary | ICD-10-CM | POA: Insufficient documentation

## 2011-07-04 DIAGNOSIS — S42253A Displaced fracture of greater tuberosity of unspecified humerus, initial encounter for closed fracture: Secondary | ICD-10-CM | POA: Insufficient documentation

## 2011-07-04 DIAGNOSIS — W19XXXA Unspecified fall, initial encounter: Secondary | ICD-10-CM | POA: Insufficient documentation

## 2011-07-04 DIAGNOSIS — S43499A Other sprain of unspecified shoulder joint, initial encounter: Secondary | ICD-10-CM | POA: Insufficient documentation

## 2011-07-11 NOTE — Op Note (Signed)
NAME:  Laura Farley, STIPES NO.:  [id number]  MEDICAL RECORD NO.:  [id number]  LOCATION:                                 FACILITY:  PHYSICIAN:  Jones Broom, MD    DATE OF BIRTH:  [birth year]  DATE OF PROCEDURE:  07/04/2011 DATE OF DISCHARGE:                              OPERATIVE REPORT   PREOPERATIVE DIAGNOSIS:  Left shoulder greater tuberosity fracture with retraction of the rotator cuff.  POSTOPERATIVE DIAGNOSES: 1. Left shoulder greater tuberosity fracture with retraction of the     rotator cuff. 2. Left shoulder anterior and superior labral tearing.  PROCEDURES PERFORMED: 1. Arthroscopic left shoulder rotator cuff repair with incorporation     of greater tuberosity fracture fragments into repair. 2. Debridement of left shoulder anterior and superior labral tear.  ASSISTANT SURGEON:  Jones Broom, MD  ASSISTANT:  None.  ANESTHESIA:  GETA with preoperative interscalene block.  COMPLICATIONS:  None.  DRAINS:  None.  SPECIMENS:  None.  ESTIMATED BLOOD LOSS:  Minimal.  INDICATIONS FOR SURGERY:  The patient is a 55 year old female who fell just over a week ago and presented to the emergency department with an anterior and inferior dislocation which was reduced in the ED.  She was noted to have a fragmented greater tuberosity fracture at that time.  At the time I saw her in my office, we repeated x-rays and the fragments had retracted medially and I recommended surgical fixation to restore function of the rotator cuff and decrease likelihood of pain and weakness in the future.  She understood risks, benefits and alternatives.  We talked about all arthroscopic technique versus possibility of an open procedure if I was not able to fix arthroscopically.  She was ready for either.  OPERATIVE FINDINGS:  Examination under anesthesia demonstrated no significant instability.  Diagnostic arthroscopy revealed labral tearing of the superior labrum with a  flap tear with no significant elevation of the biceps root, anterior-inferior labrum was also torn and degenerated but not completely torn off the anterior glenoid.  This was debrided back to a stable base.  She had small amount of glenoid chondromalacia on isolated area which was debrided approximately 4-mm area of the biceps tendon which was pristine.  She was noted to have extensive fracture of the entire bed of the supraspinatus extending posteriorly into the infraspinatus.  The fracture fragments were retracted a bit medially.  The fracture bed was debrided of bony comminution and the repair was carried out with double row repair with two 5.5 mm BioComposite corkscrew anchors in the medial row into the cancellous bone bed and two 4.5 mm BioComposite PushLocks in a lateral row.  The tendon and the bone fragments were brought nicely down to the prepared tuberosity.  PROCEDURE IN DETAILS:  The patient was identified in the preoperative holding area where I personally marked the operative site after verifying site side and procedure with the patient.  She was taken back to the operating room where general anesthesia was induced without complication.  She was placed in a beach-chair position and the left upper extremity was prepped and draped in a standard sterile fashion. At the appropriate time-out, procedure was carried  out and the patient did receive IV antibiotics prior to beginning of the procedure.  A posterior portal was established and arthroscope was introduced into the joint.  The anterior portal was then established with needle localization above the subscapularis and diagnostic arthroscopy was carried out with findings as described above.  She was noted to have a hemarthrosis upon entering the joint.  She had labral tearing of the superior and anterior-inferior labrum which was debrided with a shaver down to stable base.  The anterior labrum was probed and could  be medialized, but did not appear to be completely detached and given her age and overall clinical picture, I did not feel that a labral repair would be indicated.  I debrided this down to stable base.  Biceps tendon was pristine.  No loose bodies were noted in the joint.  She was noted to have extensive fracture bed of the greater tuberosity which was first viewed in the joint and then the camera was introduced in the subacromial space and the fracture site was exposed first debriding the bursa over the site.  After the fracture bed was prepared and the edge of the tendon was identified along with the associated fracture fragment, the tear was repaired with 5.5 mm BioComposite corkscrew anchors in the medial row passing sutures in a medial row configuration medial to the bony fragments at the bone tendon junction in a horizontal mattress configuration extending from the posterior aspect to the anterior aspect of the fracture.  Total of 8 sutures were passed.  One anchor did pull out of the cancellous bone bed and had to be replaced in a different location.  Great care was taken to try not to let these pull out but her bone quality was poor and this was cancellous bone.  Medial row was then tied bringing the bony fragments over the prepared tuberosity fracture bed and then one suture from each knot was used in a lateral row configuration with 4 mm BioComposite PushLock anchors with 1 placed anterior and 1 placed posterior creating a transosseous equivalent repair.  The repair was tensioned nicely and in the end I felt that the bony fragments were reduced down into the bony bed and the rotator cuff was repaired.  The final repair was viewed from lateral and posterior portals and felt to be excellent.  The arthroscope was then removed from the joint and portals were closed with 3-0 nylon in interrupted fashion.  Sterile dressings were applied including Xeroform, 4x4s, ABDs and tape.   The patient was then allowed to awaken from general anesthesia, she was transferred to the stretcher and taken to the recovery room in stable condition.  POSTOPERATIVE PLAN:  We will get x-rays in the recovery room.  She will likely be discharged home today with her family as long as she is doing well.  She will follow up in 1 week.  She will follow the large rotator cuff repair protocol.     Jones Broom, MD     JC/MEDQ  D:  07/04/2011  T:  07/05/2011  Job:  [id number]  Electronically Signed by Jones Broom  on 07/11/2011 03:57:40 PM

## 2011-10-27 ENCOUNTER — Encounter (INDEPENDENT_AMBULATORY_CARE_PROVIDER_SITE_OTHER): Payer: Self-pay | Admitting: General Surgery

## 2011-10-27 ENCOUNTER — Ambulatory Visit (INDEPENDENT_AMBULATORY_CARE_PROVIDER_SITE_OTHER): Payer: Medicare Other | Admitting: General Surgery

## 2011-10-27 ENCOUNTER — Other Ambulatory Visit (INDEPENDENT_AMBULATORY_CARE_PROVIDER_SITE_OTHER): Payer: Self-pay | Admitting: General Surgery

## 2011-10-27 VITALS — BP 160/100 | HR 67 | Temp 97.2°F | Resp 14 | Ht 62.0 in | Wt 110.6 lb

## 2011-10-27 DIAGNOSIS — Z9889 Other specified postprocedural states: Secondary | ICD-10-CM

## 2011-10-27 DIAGNOSIS — Z95828 Presence of other vascular implants and grafts: Secondary | ICD-10-CM

## 2011-10-27 NOTE — Progress Notes (Signed)
Chief Complaint  Patient presents with  . Other    port a cath removal    HPI Laura Farley is a 55 y.o. female.   HPI  55 year old Caucasian female with a history of T3 N0 right colon cancer status post adjuvant therapy who desires her Port-A-Cath to be removed. Her oncologist referred her over here. She states that she had a colonoscopy a few weeks ago and a single polyp was found. She states that it was benign. She denies any fevers, chills, shortness of breath. She endorse some  wheezing. She does smoke. However she is trying to stop. She is not taking any blood thinners. Past Medical History  Diagnosis Date  . Hypertension   . Seizures   . Neuromuscular disorder   . Cancer     Right colon cancer T3  . Tobacco use disorder     Past Surgical History  Procedure Date  . Colon surgery 2006  . Abdominal hysterectomy 1988    cancer  . Broken arm 2012    left  . Clavicle surgery 2012    left  . Tonsillectomy   . Tunneled venous port placement 2006    Family History  Problem Relation Age of Onset  . Cancer Father     prostate    Social History History  Substance Use Topics  . Smoking status: Current Everyday Smoker -- 0.5 packs/day  . Smokeless tobacco: Not on file  . Alcohol Use: No    No Known Allergies  Current Outpatient Prescriptions  Medication Sig Dispense Refill  . clonazePAM (KLONOPIN) 2 MG tablet Take 2 mg by mouth 2 (two) times daily as needed.        Marland Kitchen lisinopril (PRINIVIL,ZESTRIL) 20 MG tablet Take 20 mg by mouth daily.        Marland Kitchen PARoxetine (PAXIL) 20 MG tablet Take 20 mg by mouth every morning.          Review of Systems Review of Systems  Constitutional: Negative for fever, chills and unexpected weight change.  HENT: Negative for hearing loss, congestion, sore throat, trouble swallowing and voice change.   Eyes: Positive for visual disturbance (some blurry vision occassionally).  Respiratory: Positive for wheezing. Negative for apnea, cough  and chest tightness.   Cardiovascular: Negative for chest pain, palpitations and leg swelling.       No orthopnea, No PND  Gastrointestinal: Negative for nausea, vomiting, abdominal pain, diarrhea, constipation, blood in stool, abdominal distention and anal bleeding.  Genitourinary: Negative for hematuria, vaginal bleeding and difficulty urinating.  Musculoskeletal: Negative for arthralgias.  Skin: Negative for rash and wound.  Neurological: Positive for seizures (none for 73yrs; occurred when abruptly stopped xanax). Negative for syncope and headaches.  Hematological: Negative for adenopathy. Does not bruise/bleed easily.  Psychiatric/Behavioral: Negative for confusion.    Blood pressure 160/100, pulse 67, temperature 97.2 F (36.2 C), temperature source Temporal, resp. rate 14, height 5\' 2"  (1.575 m), weight 110 lb 9.6 oz (50.168 kg).  Physical Exam Physical Exam  Vitals reviewed. Constitutional: She is oriented to person, place, and time. She appears well-developed. No distress.       Appears older than stated age  HENT:  Head: Normocephalic and atraumatic.  Eyes: Conjunctivae are normal. Right eye exhibits no discharge. Left eye exhibits no discharge. No scleral icterus.  Neck: Normal range of motion. Neck supple. No JVD present. No thyromegaly present.  Cardiovascular: Normal rate, regular rhythm and normal heart sounds.   Pulmonary/Chest: Effort normal and  breath sounds normal. No respiratory distress. She has no wheezes. She has no rales.         Rt chest wall port  Abdominal: Soft. Bowel sounds are normal. She exhibits no distension and no mass. There is no tenderness.  Musculoskeletal: Normal range of motion.  Lymphadenopathy:    She has no cervical adenopathy.  Neurological: She is alert and oriented to person, place, and time.  Skin: Skin is warm and dry. She is not diaphoretic.  Psychiatric: She has a normal mood and affect. Her behavior is normal. Judgment and thought  content normal.    Data Reviewed Dr Danella Penton op note 2006 from port placement CT scan 03/2010  Assessment    History of Right Colon Cancer T3N0 s/p adjuvant treatment with an undesired port a cath Tobacco Use Hypertension    Plan    Port a cath removal under MAC  We discussed the risk and benefits of surgery including but not limited to bleeding, infection, injury to surrounding structures, anesthesia risks, blood clot formation, hematoma formation, seroma formation, and scarring. The patient would like to proceed with excision of her Port-A-Cath. I explained that she is slightly higher risk because of her tobacco use.  Laura Farley. Andrey Campanile, MD, FACS General, Bariatric, & Minimally Invasive Surgery [facility], [place]        [facility] M 10/27/2011, 4:56 PM

## 2011-10-27 NOTE — Patient Instructions (Signed)
Continue to try to stop smoking

## 2011-10-27 NOTE — H&P (Signed)
Chief Complaint  Patient presents with  . Other    port a cath removal    HPI Laura Farley is a 55 y.o. female.   HPI  55-year-old Caucasian female with a history of T3 N0 right colon cancer status post adjuvant therapy who desires her Port-A-Cath to be removed. Her oncologist referred her over here. She states that she had a colonoscopy a few weeks ago and a single polyp was found. She states that it was benign. She denies any fevers, chills, shortness of breath. She endorse some  wheezing. She does smoke. However she is trying to stop. She is not taking any blood thinners. Past Medical History  Diagnosis Date  . Hypertension   . Seizures   . Neuromuscular disorder   . Cancer     Right colon cancer T3  . Tobacco use disorder     Past Surgical History  Procedure Date  . Colon surgery 2006  . Abdominal hysterectomy 1988    cancer  . Broken arm 2012    left  . Clavicle surgery 2012    left  . Tonsillectomy   . Tunneled venous port placement 2006    Family History  Problem Relation Age of Onset  . Cancer Father     prostate    Social History History  Substance Use Topics  . Smoking status: Current Everyday Smoker -- 0.5 packs/day  . Smokeless tobacco: Not on file  . Alcohol Use: No    No Known Allergies  Current Outpatient Prescriptions  Medication Sig Dispense Refill  . clonazePAM (KLONOPIN) 2 MG tablet Take 2 mg by mouth 2 (two) times daily as needed.        . lisinopril (PRINIVIL,ZESTRIL) 20 MG tablet Take 20 mg by mouth daily.        . PARoxetine (PAXIL) 20 MG tablet Take 20 mg by mouth every morning.          Review of Systems Review of Systems  Constitutional: Negative for fever, chills and unexpected weight change.  HENT: Negative for hearing loss, congestion, sore throat, trouble swallowing and voice change.   Eyes: Positive for visual disturbance (some blurry vision occassionally).  Respiratory: Positive for wheezing. Negative for apnea, cough  and chest tightness.   Cardiovascular: Negative for chest pain, palpitations and leg swelling.       No orthopnea, No PND  Gastrointestinal: Negative for nausea, vomiting, abdominal pain, diarrhea, constipation, blood in stool, abdominal distention and anal bleeding.  Genitourinary: Negative for hematuria, vaginal bleeding and difficulty urinating.  Musculoskeletal: Negative for arthralgias.  Skin: Negative for rash and wound.  Neurological: Positive for seizures (none for 2yrs; occurred when abruptly stopped xanax). Negative for syncope and headaches.  Hematological: Negative for adenopathy. Does not bruise/bleed easily.  Psychiatric/Behavioral: Negative for confusion.    Blood pressure 160/100, pulse 67, temperature 97.2 F (36.2 C), temperature source Temporal, resp. rate 14, height 5' 2" (1.575 m), weight 110 lb 9.6 oz (50.168 kg).  Physical Exam Physical Exam  Vitals reviewed. Constitutional: She is oriented to person, place, and time. She appears well-developed. No distress.       Appears older than stated age  HENT:  Head: Normocephalic and atraumatic.  Eyes: Conjunctivae are normal. Right eye exhibits no discharge. Left eye exhibits no discharge. No scleral icterus.  Neck: Normal range of motion. Neck supple. No JVD present. No thyromegaly present.  Cardiovascular: Normal rate, regular rhythm and normal heart sounds.   Pulmonary/Chest: Effort normal and   breath sounds normal. No respiratory distress. She has no wheezes. She has no rales.         Rt chest wall port  Abdominal: Soft. Bowel sounds are normal. She exhibits no distension and no mass. There is no tenderness.  Musculoskeletal: Normal range of motion.  Lymphadenopathy:    She has no cervical adenopathy.  Neurological: She is alert and oriented to person, place, and time.  Skin: Skin is warm and dry. She is not diaphoretic.  Psychiatric: She has a normal mood and affect. Her behavior is normal. Judgment and thought  content normal.    Data Reviewed Dr Ballen's op note 2006 from port placement CT scan 03/2010  Assessment    History of Right Colon Cancer T3N0 s/p adjuvant treatment with an undesired port a cath Tobacco Use Hypertension    Plan    Port a cath removal under MAC  We discussed the risk and benefits of surgery including but not limited to bleeding, infection, injury to surrounding structures, anesthesia risks, blood clot formation, hematoma formation, seroma formation, and scarring. The patient would like to proceed with excision of her Port-A-Cath. I explained that she is slightly higher risk because of her tobacco use.  Dewaine Morocho M. Keval Nam, MD, FACS General, Bariatric, & Minimally Invasive Surgery [facility] [place] 

## 2011-10-30 ENCOUNTER — Encounter (HOSPITAL_BASED_OUTPATIENT_CLINIC_OR_DEPARTMENT_OTHER): Payer: Self-pay | Admitting: *Deleted

## 2011-10-30 NOTE — Progress Notes (Signed)
Pt to bring all meds-does not have an albuterol inhaler at home-needs one.

## 2011-10-31 ENCOUNTER — Ambulatory Visit (HOSPITAL_BASED_OUTPATIENT_CLINIC_OR_DEPARTMENT_OTHER)
Admission: RE | Admit: 2011-10-31 | Discharge: 2011-10-31 | Disposition: A | Discharge status: Home / Self Care | Payer: Medicare Other | Source: Ambulatory Visit | Attending: General Surgery | Admitting: General Surgery

## 2011-10-31 ENCOUNTER — Encounter (HOSPITAL_BASED_OUTPATIENT_CLINIC_OR_DEPARTMENT_OTHER): Payer: Self-pay | Admitting: Anesthesiology

## 2011-10-31 ENCOUNTER — Encounter (HOSPITAL_BASED_OUTPATIENT_CLINIC_OR_DEPARTMENT_OTHER): Payer: Self-pay | Admitting: *Deleted

## 2011-10-31 ENCOUNTER — Ambulatory Visit (HOSPITAL_BASED_OUTPATIENT_CLINIC_OR_DEPARTMENT_OTHER): Payer: Medicare Other | Admitting: Anesthesiology

## 2011-10-31 ENCOUNTER — Encounter (HOSPITAL_BASED_OUTPATIENT_CLINIC_OR_DEPARTMENT_OTHER)
Admission: RE | Disposition: A | Discharge status: Home / Self Care | Payer: Self-pay | Source: Ambulatory Visit | Attending: General Surgery

## 2011-10-31 ENCOUNTER — Other Ambulatory Visit: Payer: Self-pay

## 2011-10-31 DIAGNOSIS — J4489 Other specified chronic obstructive pulmonary disease: Secondary | ICD-10-CM | POA: Insufficient documentation

## 2011-10-31 DIAGNOSIS — Z452 Encounter for adjustment and management of vascular access device: Secondary | ICD-10-CM | POA: Insufficient documentation

## 2011-10-31 DIAGNOSIS — C189 Malignant neoplasm of colon, unspecified: Secondary | ICD-10-CM | POA: Insufficient documentation

## 2011-10-31 DIAGNOSIS — I10 Essential (primary) hypertension: Secondary | ICD-10-CM | POA: Insufficient documentation

## 2011-10-31 DIAGNOSIS — Z79899 Other long term (current) drug therapy: Secondary | ICD-10-CM | POA: Insufficient documentation

## 2011-10-31 DIAGNOSIS — J449 Chronic obstructive pulmonary disease, unspecified: Secondary | ICD-10-CM | POA: Insufficient documentation

## 2011-10-31 HISTORY — PX: PORT-A-CATH REMOVAL: SHX5289

## 2011-10-31 LAB — POCT I-STAT, CHEM 8
BUN: 10 mg/dL (ref 6–23)
Calcium, Ion: 0.96 mmol/L — ABNORMAL LOW (ref 1.12–1.32)
Creatinine, Ser: 0.8 mg/dL (ref 0.50–1.10)
Hemoglobin: 15.6 g/dL — ABNORMAL HIGH (ref 12.0–15.0)
TCO2: 22 mmol/L (ref 0–100)

## 2011-10-31 SURGERY — REMOVAL PORT-A-CATH
Anesthesia: Monitor Anesthesia Care | Site: Chest | Wound class: Clean

## 2011-10-31 MED ORDER — FENTANYL CITRATE 0.05 MG/ML IJ SOLN
25.0000 ug | INTRAMUSCULAR | Status: DC | PRN
  Administered 2011-10-31 (×2): 25 ug via INTRAVENOUS

## 2011-10-31 MED ORDER — ONDANSETRON HCL 4 MG/2ML IJ SOLN
INTRAMUSCULAR | Status: DC | PRN
  Administered 2011-10-31: 4 mg via INTRAVENOUS

## 2011-10-31 MED ORDER — PROMETHAZINE HCL 25 MG/ML IJ SOLN: 6.2500 mg | INTRAMUSCULAR | Status: DC | PRN

## 2011-10-31 MED ORDER — LACTATED RINGERS IV SOLN
INTRAVENOUS | Status: DC
  Administered 2011-10-31 (×2): via INTRAVENOUS

## 2011-10-31 MED ORDER — LABETALOL HCL 5 MG/ML IV SOLN
10.0000 mg | Freq: Once | INTRAVENOUS | Status: AC
  Administered 2011-10-31: 10 mg via INTRAVENOUS

## 2011-10-31 MED ORDER — OXYCODONE-ACETAMINOPHEN 5-325 MG PO TABS: 1.0000 | ORAL_TABLET | ORAL | Status: AC | PRN

## 2011-10-31 MED ORDER — MIDAZOLAM HCL 5 MG/5ML IJ SOLN
INTRAMUSCULAR | Status: DC | PRN
  Administered 2011-10-31: 2 mg via INTRAVENOUS

## 2011-10-31 MED ORDER — CEFAZOLIN SODIUM 1-5 GM-% IV SOLN
1.0000 g | INTRAVENOUS | Status: AC
  Administered 2011-10-31: 1 g via INTRAVENOUS

## 2011-10-31 MED ORDER — FENTANYL CITRATE 0.05 MG/ML IJ SOLN
INTRAMUSCULAR | Status: DC | PRN
  Administered 2011-10-31: 25 ug via INTRAVENOUS
  Administered 2011-10-31: 50 ug via INTRAVENOUS

## 2011-10-31 MED ORDER — LIDOCAINE HCL (CARDIAC) 20 MG/ML IV SOLN
INTRAVENOUS | Status: DC | PRN
  Administered 2011-10-31: 40 mg via INTRAVENOUS

## 2011-10-31 MED ORDER — BUPIVACAINE-EPINEPHRINE PF 0.25-1:200000 % IJ SOLN
INTRAMUSCULAR | Status: DC | PRN
  Administered 2011-10-31: 10 mL

## 2011-10-31 MED ORDER — PROPOFOL 10 MG/ML IV EMUL
INTRAVENOUS | Status: DC | PRN
  Administered 2011-10-31: 50 ug/kg/min via INTRAVENOUS

## 2011-10-31 SURGICAL SUPPLY — 29 items
ADH SKN CLS APL DERMABOND .7 (GAUZE/BANDAGES/DRESSINGS) ×1
BLADE SURG 15 STRL LF DISP TIS (BLADE) ×1 IMPLANT
BLADE SURG 15 STRL SS (BLADE) ×2
COVER MAYO STAND STRL (DRAPES) ×2 IMPLANT
COVER TABLE BACK 60X90 (DRAPES) ×2 IMPLANT
DECANTER SPIKE VIAL GLASS SM (MISCELLANEOUS) ×2 IMPLANT
DERMABOND ADVANCED (GAUZE/BANDAGES/DRESSINGS) ×1
DERMABOND ADVANCED .7 DNX12 (GAUZE/BANDAGES/DRESSINGS) ×1 IMPLANT
DRAPE PED LAPAROTOMY (DRAPES) ×2 IMPLANT
DRAPE UTILITY XL STRL (DRAPES) ×2 IMPLANT
ELECT COATED BLADE 2.86 ST (ELECTRODE) ×2 IMPLANT
ELECT REM PT RETURN 9FT ADLT (ELECTROSURGICAL) ×2
ELECTRODE REM PT RTRN 9FT ADLT (ELECTROSURGICAL) ×1 IMPLANT
GLOVE BIO SURGEON STRL SZ 6.5 (GLOVE) ×1 IMPLANT
GLOVE BIO SURGEON STRL SZ7.5 (GLOVE) ×2 IMPLANT
GOWN PREVENTION PLUS XLARGE (GOWN DISPOSABLE) ×2 IMPLANT
NDL HYPO 25X1 1.5 SAFETY (NEEDLE) ×1 IMPLANT
NEEDLE HYPO 25X1 1.5 SAFETY (NEEDLE) ×2 IMPLANT
PACK BASIN DAY SURGERY FS (CUSTOM PROCEDURE TRAY) ×2 IMPLANT
PENCIL BUTTON HOLSTER BLD 10FT (ELECTRODE) ×2 IMPLANT
SLEEVE SCD COMPRESS KNEE MED (MISCELLANEOUS) IMPLANT
SUT MON AB 4-0 PC3 18 (SUTURE) ×2 IMPLANT
SUT VIC AB 3-0 SH 27 (SUTURE) ×2
SUT VIC AB 3-0 SH 27X BRD (SUTURE) ×1 IMPLANT
SYR CONTROL 10ML LL (SYRINGE) ×2 IMPLANT
TOWEL OR 17X24 6PK STRL BLUE (TOWEL DISPOSABLE) ×2 IMPLANT
TOWEL OR NON WOVEN STRL DISP B (DISPOSABLE) ×2 IMPLANT
TRAY DSU PREP LF (CUSTOM PROCEDURE TRAY) ×1 IMPLANT
WATER STERILE IRR 1000ML POUR (IV SOLUTION) ×2 IMPLANT

## 2011-10-31 NOTE — Op Note (Signed)
Preoperative diagnosis: undesired port a cath; h/o colon cancer  Postoperative  diagnosis: same  Procedure: removal of right subclavian port a cath  Anesthesia: MAC plus 0.25% marcaine with epi  Specimen: Port a cath  Indications: 55 yo WF with h/o colon cancer with a undesired port a cath.  Pt desired port removal.  Risks & benefits discussed including but not limited to bleeding, infection, injury to surrounding structures, blood clot formation, airway issues.  Aftercare also discussed with pt.  The pt elected to proceed with removal.  Description of procedure:  After obtaining informed consent & marking the correct side, the patient was taken back to OR 5 at [facility] and placed supine on the operating table.  Monitored Anesthesia Care was established. She received preoperative antibiotics.  A time-out was performed.  Her right chest and neck were prepped & draped with Chloraprep. Local was infiltrated around her old incision.  A 2cm incision was made through the old incision.  Subcutaneous was divided with electrocautery.  The port was extracted. The sutures holding the port were excised and removed.  The catheter was pulled on & there was some resistance.  I freed the catheter tubing sharply and the remaining catheter easily came out at that point.  The catheter tubing was intact.  The wound was irrigated and closed in 2 layers.  First with inverted interrupted 3-0 Vicryls and 4-0 Monocryl for the skin. Dermabond was applied. No immediate complications. Pt tolerated the procedure well and was taken to PACU in stable condition. All counts were correct.

## 2011-10-31 NOTE — Interval H&P Note (Signed)
History and Physical Interval Note:   10/31/2011   10:05 AM   Laura Farley  has presented today for surgery, with the diagnosis of uneeded port  The various methods of treatment have been discussed with the patient and family. After consideration of risks, benefits and other options for treatment, the patient has consented to  Procedure(s): REMOVAL PORT-A-CATH as a surgical intervention .  The patients' history has been reviewed, patient examined, no change in status, stable for surgery.  I have reviewed the patients' chart and labs.  Questions were answered to the patient's satisfaction.     [name]. Andrey Campanile, MD, FACS General, Bariatric, & Minimally Invasive Surgery [facility], [place]  Atilano Ina  MD

## 2011-10-31 NOTE — H&P (View-Only) (Signed)
Chief Complaint  Patient presents with  . Other    port a cath removal    HPI Laura Farley is a 55 y.o. female.   HPI  55 year old Caucasian female with a history of T3 N0 right colon cancer status post adjuvant therapy who desires her Port-A-Cath to be removed. Her oncologist referred her over here. She states that she had a colonoscopy a few weeks ago and a single polyp was found. She states that it was benign. She denies any fevers, chills, shortness of breath. She endorse some  wheezing. She does smoke. However she is trying to stop. She is not taking any blood thinners. Past Medical History  Diagnosis Date  . Hypertension   . Seizures   . Neuromuscular disorder   . Cancer     Right colon cancer T3  . Tobacco use disorder     Past Surgical History  Procedure Date  . Colon surgery 2006  . Abdominal hysterectomy 1988    cancer  . Broken arm 2012    left  . Clavicle surgery 2012    left  . Tonsillectomy   . Tunneled venous port placement 2006    Family History  Problem Relation Age of Onset  . Cancer Father     prostate    Social History History  Substance Use Topics  . Smoking status: Current Everyday Smoker -- 0.5 packs/day  . Smokeless tobacco: Not on file  . Alcohol Use: No    No Known Allergies  Current Outpatient Prescriptions  Medication Sig Dispense Refill  . clonazePAM (KLONOPIN) 2 MG tablet Take 2 mg by mouth 2 (two) times daily as needed.        Marland Kitchen lisinopril (PRINIVIL,ZESTRIL) 20 MG tablet Take 20 mg by mouth daily.        Marland Kitchen PARoxetine (PAXIL) 20 MG tablet Take 20 mg by mouth every morning.          Review of Systems Review of Systems  Constitutional: Negative for fever, chills and unexpected weight change.  HENT: Negative for hearing loss, congestion, sore throat, trouble swallowing and voice change.   Eyes: Positive for visual disturbance (some blurry vision occassionally).  Respiratory: Positive for wheezing. Negative for apnea, cough  and chest tightness.   Cardiovascular: Negative for chest pain, palpitations and leg swelling.       No orthopnea, No PND  Gastrointestinal: Negative for nausea, vomiting, abdominal pain, diarrhea, constipation, blood in stool, abdominal distention and anal bleeding.  Genitourinary: Negative for hematuria, vaginal bleeding and difficulty urinating.  Musculoskeletal: Negative for arthralgias.  Skin: Negative for rash and wound.  Neurological: Positive for seizures (none for 67yrs; occurred when abruptly stopped xanax). Negative for syncope and headaches.  Hematological: Negative for adenopathy. Does not bruise/bleed easily.  Psychiatric/Behavioral: Negative for confusion.    Blood pressure 160/100, pulse 67, temperature 97.2 F (36.2 C), temperature source Temporal, resp. rate 14, height 5\' 2"  (1.575 m), weight 110 lb 9.6 oz (50.168 kg).  Physical Exam Physical Exam  Vitals reviewed. Constitutional: She is oriented to person, place, and time. She appears well-developed. No distress.       Appears older than stated age  HENT:  Head: Normocephalic and atraumatic.  Eyes: Conjunctivae are normal. Right eye exhibits no discharge. Left eye exhibits no discharge. No scleral icterus.  Neck: Normal range of motion. Neck supple. No JVD present. No thyromegaly present.  Cardiovascular: Normal rate, regular rhythm and normal heart sounds.   Pulmonary/Chest: Effort normal and  breath sounds normal. No respiratory distress. She has no wheezes. She has no rales.         Rt chest wall port  Abdominal: Soft. Bowel sounds are normal. She exhibits no distension and no mass. There is no tenderness.  Musculoskeletal: Normal range of motion.  Lymphadenopathy:    She has no cervical adenopathy.  Neurological: She is alert and oriented to person, place, and time.  Skin: Skin is warm and dry. She is not diaphoretic.  Psychiatric: She has a normal mood and affect. Her behavior is normal. Judgment and thought  content normal.    Data Reviewed Dr Danella Penton op note 2006 from port placement CT scan 03/2010  Assessment    History of Right Colon Cancer T3N0 s/p adjuvant treatment with an undesired port a cath Tobacco Use Hypertension    Plan    Port a cath removal under MAC  We discussed the risk and benefits of surgery including but not limited to bleeding, infection, injury to surrounding structures, anesthesia risks, blood clot formation, hematoma formation, seroma formation, and scarring. The patient would like to proceed with excision of her Port-A-Cath. I explained that she is slightly higher risk because of her tobacco use.  [name]. Andrey Campanile, MD, FACS General, Bariatric, & Minimally Invasive Surgery [facility], [place]

## 2011-10-31 NOTE — Transfer of Care (Signed)
Immediate Anesthesia Transfer of Care Note  Patient: Laura Farley  Procedure(s) Performed:  REMOVAL PORT-A-CATH - port-a-cath removal  Patient Location: PACU  Anesthesia Type: MAC  Level of Consciousness: sedated  Airway & Oxygen Therapy: Patient Spontanous Breathing and Patient connected to face mask oxygen  Post-op Assessment: Report given to PACU RN  Post vital signs: Reviewed and stable  Complications: No apparent anesthesia complications

## 2011-10-31 NOTE — Anesthesia Preprocedure Evaluation (Signed)
Anesthesia Evaluation  Patient identified by MRN, date of birth, ID band Patient awake    Reviewed: Allergy & Precautions, H&P , NPO status , Patient's Chart, lab work & pertinent test results  Airway Mallampati: I TM Distance: >3 FB Neck ROM: Full    Dental   Pulmonary COPD COPD inhaler, Current Smoker,  + rhonchi        Cardiovascular hypertension,     Neuro/Psych Seizures -,   Neuromuscular disease    GI/Hepatic   Endo/Other    Renal/GU      Musculoskeletal  (+) Fibromyalgia -  Abdominal   Peds  Hematology   Anesthesia Other Findings Significant tobacco usage  Reproductive/Obstetrics                           Anesthesia Physical Anesthesia Plan  ASA: III  Anesthesia Plan: MAC   Post-op Pain Management:    Induction: Intravenous  Airway Management Planned: Simple Face Mask  Additional Equipment:   Intra-op Plan:   Post-operative Plan:   Informed Consent: I have reviewed the patients History and Physical, chart, labs and discussed the procedure including the risks, benefits and alternatives for the proposed anesthesia with the patient or authorized representative who has indicated his/her understanding and acceptance.     Plan Discussed with: CRNA and Surgeon  Anesthesia Plan Comments:         Anesthesia Quick Evaluation

## 2011-11-01 NOTE — Anesthesia Postprocedure Evaluation (Signed)
  Anesthesia Post-op Note  Patient: Laura Farley  Procedure(s) Performed:  REMOVAL PORT-A-CATH - port-a-cath removal  Patient Location: PACU  Anesthesia Type: MAC  Level of Consciousness: awake, alert  and oriented  Airway and Oxygen Therapy: Patient Spontanous Breathing  Post-op Pain: mild  Post-op Assessment: Post-op Vital signs reviewed, Patient's Cardiovascular Status Stable, Respiratory Function Stable, Patent Airway, No signs of Nausea or vomiting, Adequate PO intake and Pain level controlled  Post-op Vital Signs: stable  Complications: No apparent anesthesia complications

## 2011-11-04 ENCOUNTER — Encounter (HOSPITAL_BASED_OUTPATIENT_CLINIC_OR_DEPARTMENT_OTHER): Payer: Self-pay | Admitting: General Surgery

## 2011-11-15 ENCOUNTER — Encounter (INDEPENDENT_AMBULATORY_CARE_PROVIDER_SITE_OTHER): Payer: Medicare Other | Admitting: General Surgery

## 2012-02-09 ENCOUNTER — Inpatient Hospital Stay (HOSPITAL_COMMUNITY)
Admission: EM | Admit: 2012-02-09 | Discharge: 2012-02-10 | DRG: 378 | Disposition: A | Discharge status: Home / Self Care | Payer: Medicare Other | Attending: Internal Medicine | Admitting: Internal Medicine

## 2012-02-09 ENCOUNTER — Emergency Department (HOSPITAL_COMMUNITY): Payer: Medicare Other

## 2012-02-09 ENCOUNTER — Encounter (HOSPITAL_COMMUNITY): Payer: Self-pay | Admitting: *Deleted

## 2012-02-09 DIAGNOSIS — F17200 Nicotine dependence, unspecified, uncomplicated: Secondary | ICD-10-CM | POA: Diagnosis present

## 2012-02-09 DIAGNOSIS — F411 Generalized anxiety disorder: Secondary | ICD-10-CM | POA: Diagnosis present

## 2012-02-09 DIAGNOSIS — Z720 Tobacco use: Secondary | ICD-10-CM | POA: Diagnosis present

## 2012-02-09 DIAGNOSIS — F1010 Alcohol abuse, uncomplicated: Secondary | ICD-10-CM | POA: Diagnosis present

## 2012-02-09 DIAGNOSIS — D62 Acute posthemorrhagic anemia: Secondary | ICD-10-CM | POA: Diagnosis present

## 2012-02-09 DIAGNOSIS — R197 Diarrhea, unspecified: Secondary | ICD-10-CM | POA: Diagnosis present

## 2012-02-09 DIAGNOSIS — K922 Gastrointestinal hemorrhage, unspecified: Secondary | ICD-10-CM | POA: Diagnosis present

## 2012-02-09 DIAGNOSIS — K766 Portal hypertension: Secondary | ICD-10-CM | POA: Diagnosis present

## 2012-02-09 DIAGNOSIS — K449 Diaphragmatic hernia without obstruction or gangrene: Secondary | ICD-10-CM | POA: Diagnosis present

## 2012-02-09 DIAGNOSIS — C189 Malignant neoplasm of colon, unspecified: Secondary | ICD-10-CM | POA: Diagnosis present

## 2012-02-09 DIAGNOSIS — I10 Essential (primary) hypertension: Secondary | ICD-10-CM | POA: Diagnosis present

## 2012-02-09 DIAGNOSIS — G40909 Epilepsy, unspecified, not intractable, without status epilepticus: Secondary | ICD-10-CM | POA: Diagnosis present

## 2012-02-09 DIAGNOSIS — F419 Anxiety disorder, unspecified: Secondary | ICD-10-CM | POA: Diagnosis present

## 2012-02-09 DIAGNOSIS — D72829 Elevated white blood cell count, unspecified: Secondary | ICD-10-CM | POA: Diagnosis present

## 2012-02-09 DIAGNOSIS — I8500 Esophageal varices without bleeding: Secondary | ICD-10-CM | POA: Diagnosis present

## 2012-02-09 DIAGNOSIS — Z79899 Other long term (current) drug therapy: Secondary | ICD-10-CM

## 2012-02-09 DIAGNOSIS — K921 Melena: Principal | ICD-10-CM | POA: Diagnosis present

## 2012-02-09 DIAGNOSIS — Z85038 Personal history of other malignant neoplasm of large intestine: Secondary | ICD-10-CM

## 2012-02-09 DIAGNOSIS — K319 Disease of stomach and duodenum, unspecified: Secondary | ICD-10-CM | POA: Diagnosis present

## 2012-02-09 HISTORY — DX: Malignant neoplasm of colon, unspecified: C18.9

## 2012-02-09 LAB — URINALYSIS, ROUTINE W REFLEX MICROSCOPIC
Bilirubin Urine: NEGATIVE
Glucose, UA: NEGATIVE mg/dL
Nitrite: NEGATIVE
Specific Gravity, Urine: 1.014 (ref 1.005–1.030)
pH: 5.5 (ref 5.0–8.0)

## 2012-02-09 LAB — COMPREHENSIVE METABOLIC PANEL
ALT: 29 U/L (ref 0–35)
AST: 20 U/L (ref 0–37)
Albumin: 3.6 g/dL (ref 3.5–5.2)
Calcium: 9.1 mg/dL (ref 8.4–10.5)
GFR calc Af Amer: 53 mL/min — ABNORMAL LOW (ref >90)
Sodium: 144 mEq/L (ref 135–145)
Total Protein: 6.1 g/dL (ref 6.0–8.3)

## 2012-02-09 LAB — CBC
MCH: 34.4 pg — ABNORMAL HIGH (ref 26.0–34.0)
MCV: 99 fL (ref 78.0–100.0)
MCV: 100.4 fL — ABNORMAL HIGH (ref 78.0–100.0)
Platelets: 326 10*3/uL (ref 150–400)
Platelets: 261 10*3/uL (ref 150–400)
RBC: 2.61 MIL/uL — ABNORMAL LOW (ref 3.87–5.11)
RDW: 15.8 % — ABNORMAL HIGH (ref 11.5–15.5)
WBC: 16 10*3/uL — ABNORMAL HIGH (ref 4.0–10.5)

## 2012-02-09 LAB — DIFFERENTIAL
Basophils Absolute: 0 10*3/uL (ref 0.0–0.1)
Basophils Relative: 0 % (ref 0–1)
Eosinophils Absolute: 0 10*3/uL (ref 0.0–0.7)
Eosinophils Relative: 0 % (ref 0–5)
Neutrophils Relative %: 76 % (ref 43–77)

## 2012-02-09 LAB — PREPARE RBC (CROSSMATCH)

## 2012-02-09 MED ORDER — SODIUM CHLORIDE 0.9 % IV BOLUS (SEPSIS)
1000.0000 mL | Freq: Once | INTRAVENOUS | Status: AC
  Administered 2012-02-09: 1000 mL via INTRAVENOUS

## 2012-02-09 MED ORDER — HYDROCODONE-ACETAMINOPHEN 5-325 MG PO TABS
1.0000 | ORAL_TABLET | ORAL | Status: DC | PRN
  Administered 2012-02-10: 2 via ORAL
  Filled 2012-02-09: qty 2

## 2012-02-09 MED ORDER — CIPROFLOXACIN IN D5W 400 MG/200ML IV SOLN
400.0000 mg | Freq: Two times a day (BID) | INTRAVENOUS | Status: DC
  Administered 2012-02-09 – 2012-02-10 (×2): 400 mg via INTRAVENOUS
  Filled 2012-02-09 (×4): qty 200

## 2012-02-09 MED ORDER — ONDANSETRON HCL 4 MG/2ML IJ SOLN: 4.0000 mg | Freq: Three times a day (TID) | INTRAMUSCULAR | Status: DC | PRN

## 2012-02-09 MED ORDER — SODIUM CHLORIDE 0.9 % IV SOLN: INTRAVENOUS | Status: AC

## 2012-02-09 MED ORDER — ALUM & MAG HYDROXIDE-SIMETH 200-200-20 MG/5ML PO SUSP: 30.0000 mL | Freq: Four times a day (QID) | ORAL | Status: DC | PRN

## 2012-02-09 MED ORDER — ONDANSETRON HCL 4 MG/2ML IJ SOLN: 4.0000 mg | Freq: Once | INTRAMUSCULAR | Status: DC

## 2012-02-09 MED ORDER — THIAMINE HCL 100 MG/ML IJ SOLN
100.0000 mg | Freq: Every day | INTRAMUSCULAR | Status: DC
  Administered 2012-02-09: 100 mg via INTRAVENOUS
  Filled 2012-02-09 (×2): qty 1

## 2012-02-09 MED ORDER — CLONAZEPAM 1 MG PO TABS
2.0000 mg | ORAL_TABLET | Freq: Two times a day (BID) | ORAL | Status: DC
  Administered 2012-02-09: 2 mg via ORAL
  Filled 2012-02-09: qty 2

## 2012-02-09 MED ORDER — ONDANSETRON HCL 4 MG/2ML IJ SOLN
INTRAMUSCULAR | Status: AC
  Administered 2012-02-09: 4 mg
  Filled 2012-02-09: qty 2

## 2012-02-09 MED ORDER — PANTOPRAZOLE SODIUM 40 MG IV SOLR
40.0000 mg | Freq: Two times a day (BID) | INTRAVENOUS | Status: DC
  Administered 2012-02-09: 40 mg via INTRAVENOUS
  Filled 2012-02-09 (×3): qty 40

## 2012-02-09 MED ORDER — NICOTINE 21 MG/24HR TD PT24
21.0000 mg | MEDICATED_PATCH | Freq: Every day | TRANSDERMAL | Status: DC
  Administered 2012-02-09: 21 mg via TRANSDERMAL
  Filled 2012-02-09 (×2): qty 1

## 2012-02-09 MED ORDER — ONDANSETRON HCL 4 MG/2ML IJ SOLN
4.0000 mg | INTRAMUSCULAR | Status: DC | PRN
  Administered 2012-02-09: 4 mg via INTRAVENOUS
  Filled 2012-02-09 (×2): qty 2

## 2012-02-09 MED ORDER — ONDANSETRON HCL 4 MG/2ML IJ SOLN
INTRAMUSCULAR | Status: AC
  Administered 2012-02-09: 12:00:00
  Filled 2012-02-09: qty 2

## 2012-02-09 MED ORDER — HYDROMORPHONE HCL PF 1 MG/ML IJ SOLN
1.0000 mg | Freq: Once | INTRAMUSCULAR | Status: AC
  Administered 2012-02-09: 1 mg via INTRAVENOUS
  Filled 2012-02-09: qty 1

## 2012-02-09 MED ORDER — HYDROMORPHONE HCL PF 1 MG/ML IJ SOLN
1.0000 mg | INTRAMUSCULAR | Status: AC | PRN
  Administered 2012-02-09 – 2012-02-10 (×2): 1 mg via INTRAVENOUS
  Filled 2012-02-09: qty 1

## 2012-02-09 MED ORDER — HYDROMORPHONE HCL PF 1 MG/ML IJ SOLN
1.0000 mg | Freq: Once | INTRAMUSCULAR | Status: DC
  Filled 2012-02-09: qty 1

## 2012-02-09 MED ORDER — FENTANYL CITRATE 0.05 MG/ML IJ SOLN
50.0000 ug | Freq: Once | INTRAMUSCULAR | Status: AC
  Administered 2012-02-09: 50 ug via INTRAVENOUS
  Filled 2012-02-09: qty 2

## 2012-02-09 MED ORDER — ONDANSETRON 4 MG PO TBDP: 4.0000 mg | ORAL_TABLET | Freq: Once | ORAL | Status: DC

## 2012-02-09 MED ORDER — SODIUM CHLORIDE 0.9 % IJ SOLN: 3.0000 mL | Freq: Two times a day (BID) | INTRAMUSCULAR | Status: DC

## 2012-02-09 MED ORDER — ACETAMINOPHEN 325 MG PO TABS: 650.0000 mg | ORAL_TABLET | Freq: Four times a day (QID) | ORAL | Status: DC | PRN

## 2012-02-09 MED ORDER — PAROXETINE HCL 20 MG PO TABS
20.0000 mg | ORAL_TABLET | Freq: Every day | ORAL | Status: DC
  Administered 2012-02-10: 20 mg via ORAL
  Filled 2012-02-09 (×2): qty 1

## 2012-02-09 MED ORDER — ACETAMINOPHEN 650 MG RE SUPP: 650.0000 mg | Freq: Four times a day (QID) | RECTAL | Status: DC | PRN

## 2012-02-09 MED ORDER — DEXTROSE-NACL 5-0.45 % IV SOLN
INTRAVENOUS | Status: DC
  Administered 2012-02-09: 19:00:00 via INTRAVENOUS

## 2012-02-09 NOTE — H&P (Signed)
PCP:   RAMACHANDRAN,AJITH, MD, MD   Chief Complaint:  vomiting  HPI: HPI: This is a 56 year old female with a hx of colon cancer s/p right hemicolectomy in 2005 who presents to the ER with complaints of abdominal pain, nausea, and vomiting. Her nausea and vomiting started one month ago and it has been progressive. She was to the point that she could not tolerate it any further and she then presented to the ER. She also mentions melena starting this past Thursday and there is a noted decline in her HGB. She had a colonoscopy on 02/07/2011 with negative findings for a recurrent colon cancer and findings of a hyperplastic polyp. Patient reports abdominal pain and diarrhea. She also reports emesis with specs of blood.    Review of Systems:  The patient denies weight loss,, vision loss, decreased hearing, hoarseness, chest pain, syncope, dyspnea on exertion, peripheral edema, balance deficits, hemoptysis,  severe indigestion/heartburn, hematuria, incontinence, genital sores, muscle weakness, suspicious skin lesions, transient blindness, difficulty walking, depression, unusual weight change,  Past Medical History: Past Medical History  Diagnosis Date  . Hypertension   . Neuromuscular disorder   . Cancer     Right colon cancer T3  . Tobacco use disorder   . Seizures     withdraw from xanax  . Colon cancer    Past Surgical History  Procedure Date  . Colon surgery 2006  . Abdominal hysterectomy 1988    cancer  . Broken arm 2012    left  . Clavicle surgery 2012    left  . Tonsillectomy   . Tunneled venous port placement 2006  . Port-a-cath removal 10/31/2011    Procedure: REMOVAL PORT-A-CATH;  Surgeon: Atilano Ina, MD;  Location: [facility];  Service: General;  Laterality: N/A;  port-a-cath removal    Medications: Prior to Admission medications   Medication Sig Start Date End Date Taking? Authorizing Provider  clonazePAM (KLONOPIN) 2 MG tablet Take 2 mg by mouth 2  (two) times daily.    Yes Historical Provider, MD  lisinopril (PRINIVIL,ZESTRIL) 20 MG tablet Take 20 mg by mouth daily.     Yes Historical Provider, MD  PARoxetine (PAXIL) 20 MG tablet Take 20 mg by mouth every morning.     Yes Historical Provider, MD    Allergies:  No Known Allergies  Social History:  reports that she has been smoking.  She does not have any smokeless tobacco history on file. She reports that she drinks alcohol. She reports that she does not use illicit drugs.  History   Social History Narrative   Lives with husband and son      Family History: Family History  Problem Relation Age of Onset  . Cancer Father     prostate    Physical Exam: Filed Vitals:   02/09/12 1209 02/09/12 1400 02/09/12 1545 02/09/12 1726  BP: 118/33 119/75 138/77 138/92  Pulse: 107 102 91 92  Temp: 97.8 F (36.6 C)   98.1 F (36.7 C)  TempSrc:    Oral  Resp: 12 16 20 20   Height:    5\' 1"  (1.549 m)  Weight:    47.446 kg (104 lb 9.6 oz)  SpO2: 100% 100% 98% 96%   General appearance: alert, cooperative, appears stated age and mild distress Head: Normocephalic, without obvious abnormality, atraumatic Eyes: conjunctivae/corneas clear. PERRL, EOM's intact. Fundi benign. Nose: Nares normal. Septum midline. Mucosa normal. No drainage or sinus tenderness. Throat: lips, mucosa, and tongue normal;  teeth and gums normal Neck: no adenopathy, no carotid bruit, no JVD, supple, symmetrical, trachea midline and thyroid not enlarged, symmetric, no tenderness/mass/nodules Back: symmetric, no curvature. ROM normal. No CVA tenderness. Resp: clear to auscultation bilaterally Chest wall: no tenderness Cardio: regular rate and rhythm, S1, S2 normal, no murmur, click, rub or gallop GI: soft, tender in the epigastric area; bowel sounds decreased; no masses,  no organomegaly LE : without edema    Labs on Admission:   [facility] 02/09/12 1228  NA 144  K 3.4*  CL 107  CO2 20  GLUCOSE 111*  BUN 55*   CREATININE 1.27*  CALCIUM 9.1  MG --  PHOS --    Basename 02/09/12 1228  AST 20  ALT 29  ALKPHOS 128*  BILITOT 0.5  PROT 6.1  ALBUMIN 3.6    Basename 02/09/12 1228  WBC 18.7*  NEUTROABS 14.3*  HGB 10.4*  HCT 29.9*  MCV 99.0  PLT 326    Radiological Exams on Admission: Dg Abd 2 Views  02/09/2012  *RADIOLOGY REPORT*  Clinical Data: Abdominal pain, vomiting  ABDOMEN - 2 VIEW  Comparison: CT abdomen pelvis of 03/29/2010  Findings: Supine and erect views of the abdomen show bowel gas within the colon without distention.  No definite bowel obstruction is seen, with air noted to the rectum.  There is slight gaseous distention of the stomach.  No free intraperitoneal air is seen. No opaque calculi noted.  IMPRESSION: No evidence of bowel obstruction.  No free air.  Original Report Authenticated By: Juline Patch, M.D.    Assessment/Plan 55 yo woman with nausea and vomiting and abdominal pain , now with melena, GI bleed and diarrhea, also leukocytosis and chills.  Present on Admission:  .Colon cancer .Hypertension .GI bleed .Anemia due to blood loss, acute .Anxiety .Diarrhea  Plan for iv fluids, antibiotics, transfuse 2 units of prbcs, iv protonix, iv fluids, await results of egd tomorrow.  If unstable overnight needs transfer to icu     Nitin Mckowen 02/09/2012, 6:30 PM

## 2012-02-09 NOTE — ED Notes (Signed)
Reminded patient we need a urine sample.  Patient stated she had already urinated.  Will check with her later.

## 2012-02-09 NOTE — ED Provider Notes (Addendum)
Reports multiple episodes of vomiting low abdomenal pain and blood per him onset yesterday morning still reports nausea on exam alert nontoxic lungs clear to auscultation abdomen nondistended normal active bowel sounds tender at the periumbilical area no guarding rigidity or rebound  Doug Sou, MD 02/09/12 1428  Doug Sou, MD 02/09/12 [phone]

## 2012-02-09 NOTE — Consult Note (Signed)
Reason for Consult: ABM pain, heme positive stool, elevated WBC Referring Physician: [facility]  Laura Farley HPI: This is a 56 year old female with a PMH of a T3 right sided colon cancer s/p reight hemicolectomy in 2005 who presents to the ER with complaints of abdominal pain, nausea, and vomiting.  Her nausea and vomiting started one month ago and it has been progressive.  She was to the point that she could not tolerate it any further and she then presented to the ER.  She also mentions melena starting this past Thursday and there is a noted decline in her HGB.  She had a colonoscopy on 02/07/2011 with negative findings for a recurrent colon cancer and findings of a hyperplastic polyp.  Interestingly her abdominal pain is in the lower abdomen rather than the upper abdomen.  Past Medical History  Diagnosis Date  . Hypertension   . Neuromuscular disorder   . Cancer     Right colon cancer T3  . Tobacco use disorder   . Seizures     withdraw from xanax    Past Surgical History  Procedure Date  . Colon surgery 2006  . Abdominal hysterectomy 1988    cancer  . Broken arm 2012    left  . Clavicle surgery 2012    left  . Tonsillectomy   . Tunneled venous port placement 2006  . Port-a-cath removal 10/31/2011    Procedure: REMOVAL PORT-A-CATH;  Surgeon: Atilano Ina, MD;  Location: [facility];  Service: General;  Laterality: N/A;  port-a-cath removal    Family History  Problem Relation Age of Onset  . Cancer Father     prostate    Social History:  reports that she has been smoking.  She does not have any smokeless tobacco history on file. She reports that she does not drink alcohol or use illicit drugs.  Allergies: No Known Allergies  Medications:  Scheduled:   . sodium chloride   Intravenous STAT  . fentaNYL  50 mcg Intravenous Once  .  HYDROmorphone (DILAUDID) injection  1 mg Intravenous Once  .  HYDROmorphone (DILAUDID) injection  1 mg  Intravenous Once  . ondansetron      . ondansetron      . ondansetron      . ondansetron  4 mg Intravenous Once  . sodium chloride  1,000 mL Intravenous Once  . sodium chloride  1,000 mL Intravenous Once  . DISCONTD: ondansetron  4 mg Oral Once   Continuous:   Results for orders placed during the hospital encounter of 02/09/12 (from the past 24 hour(s))  CBC     Status: Abnormal   Collection Time   02/09/12 12:28 PM      Component Value Range   WBC 18.7 (*) 4.0 - 10.5 (K/uL)   RBC 3.02 (*) 3.87 - 5.11 (MIL/uL)   Hemoglobin 10.4 (*) 12.0 - 15.0 (g/dL)   HCT 03.4 (*) 74.2 - 46.0 (%)   MCV 99.0  78.0 - 100.0 (fL)   MCH 34.4 (*) 26.0 - 34.0 (pg)   MCHC 34.8  30.0 - 36.0 (g/dL)   RDW 59.5 (*) 63.8 - 15.5 (%)   Platelets 326  150 - 400 (K/uL)  DIFFERENTIAL     Status: Abnormal   Collection Time   02/09/12 12:28 PM      Component Value Range   Neutrophils Relative 76  43 - 77 (%)   Neutro Abs 14.3 (*) 1.7 - 7.7 (  K/uL)   Lymphocytes Relative 15  12 - 46 (%)   Lymphs Abs 2.8  0.7 - 4.0 (K/uL)   Monocytes Relative 8  3 - 12 (%)   Monocytes Absolute 1.6 (*) 0.1 - 1.0 (K/uL)   Eosinophils Relative 0  0 - 5 (%)   Eosinophils Absolute 0.0  0.0 - 0.7 (K/uL)   Basophils Relative 0  0 - 1 (%)   Basophils Absolute 0.0  0.0 - 0.1 (K/uL)  COMPREHENSIVE METABOLIC PANEL     Status: Abnormal   Collection Time   02/09/12 12:28 PM      Component Value Range   Sodium 144  135 - 145 (mEq/L)   Potassium 3.4 (*) 3.5 - 5.1 (mEq/L)   Chloride 107  96 - 112 (mEq/L)   CO2 20  19 - 32 (mEq/L)   Glucose, Bld 111 (*) 70 - 99 (mg/dL)   BUN 55 (*) 6 - 23 (mg/dL)   Creatinine, Ser 1.61 (*) 0.50 - 1.10 (mg/dL)   Calcium 9.1  8.4 - 09.6 (mg/dL)   Total Protein 6.1  6.0 - 8.3 (g/dL)   Albumin 3.6  3.5 - 5.2 (g/dL)   AST 20  0 - 37 (U/L)   ALT 29  0 - 35 (U/L)   Alkaline Phosphatase 128 (*) 39 - 117 (U/L)   Total Bilirubin 0.5  0.3 - 1.2 (mg/dL)   GFR calc non Af Amer 46 (*) >90 (mL/min)   GFR calc Af  Amer 53 (*) >90 (mL/min)  TYPE AND SCREEN     Status: Normal   Collection Time   02/09/12 12:36 PM      Component Value Range   ABO/RH(D) O POS     Antibody Screen NEG     Sample Expiration 02/12/2012    ABO/RH     Status: Normal   Collection Time   02/09/12 12:36 PM      Component Value Range   ABO/RH(D) O POS    OCCULT BLOOD, POC DEVICE     Status: Normal   Collection Time   02/09/12  1:03 PM      Component Value Range   Fecal Occult Bld POSITIVE    URINALYSIS, ROUTINE W REFLEX MICROSCOPIC     Status: Abnormal   Collection Time   02/09/12  3:38 PM      Component Value Range   Color, Urine YELLOW  YELLOW    APPearance CLEAR  CLEAR    Specific Gravity, Urine 1.014  1.005 - 1.030    pH 5.5  5.0 - 8.0    Glucose, UA NEGATIVE  NEGATIVE (mg/dL)   Hgb urine dipstick MODERATE (*) NEGATIVE    Bilirubin Urine NEGATIVE  NEGATIVE    Ketones, ur NEGATIVE  NEGATIVE (mg/dL)   Protein, ur NEGATIVE  NEGATIVE (mg/dL)   Urobilinogen, UA 0.2  0.0 - 1.0 (mg/dL)   Nitrite NEGATIVE  NEGATIVE    Leukocytes, UA NEGATIVE  NEGATIVE   URINE MICROSCOPIC-ADD ON     Status: Abnormal   Collection Time   02/09/12  3:38 PM      Component Value Range   Squamous Epithelial / LPF FEW (*) RARE    WBC, UA 0-2  <3 (WBC/hpf)   RBC / HPF 3-6  <3 (RBC/hpf)   Bacteria, UA RARE  RARE      Dg Abd 2 Views  02/09/2012  *RADIOLOGY REPORT*  Clinical Data: Abdominal pain, vomiting  ABDOMEN - 2 VIEW  Comparison: CT abdomen  pelvis of 03/29/2010  Findings: Supine and erect views of the abdomen show bowel gas within the colon without distention.  No definite bowel obstruction is seen, with air noted to the rectum.  There is slight gaseous distention of the stomach.  No free intraperitoneal air is seen. No opaque calculi noted.  IMPRESSION: No evidence of bowel obstruction.  No free air.  Original Report Authenticated By: Juline Patch, M.D.    ROS:  As stated above in the HPI otherwise negative.  Blood pressure 138/77,  pulse 91, temperature 97.8 F (36.6 C), resp. rate 20, SpO2 98.00%.    PE: Gen: NAD, Alert and Oriented HEENT:  [place]/AT, EOMI Neck: Supple, no LAD Lungs: CTA Bilaterally CV: RRR without M/G/R ABM: Soft, NTND, +BS Ext: No C/C/E  Assessment/Plan: 1) Melena/Nausea/Vomiting 2) Heme positive stool 3) Anemia 4) Personal history of colon cancer 5) Lower abdominal pain   I am not certain about the abdominal pain and her melena.  I would have presumed that her pain is in the upper abdomen.  Regardless, an EGD will be the first step in the work up.  Hopefully an etiology can be identified.  If it is negative I will repeat the colonoscopy.  Plan: 1) EGD tomorrow.   Kanyah Matsushima D 02/09/2012, 4:37 PM

## 2012-02-09 NOTE — ED Provider Notes (Signed)
Medical screening examination/treatment/procedure(s) were conducted as a shared visit with non-physician practitioner(s) and myself.  I personally evaluated the patient during the encounter  Doug Sou, MD 02/09/12 1736

## 2012-02-09 NOTE — ED Notes (Signed)
NG tube attempted with no success, PA aware

## 2012-02-09 NOTE — ED Notes (Signed)
Attempted report x 1, will call back in 10

## 2012-02-09 NOTE — ED Provider Notes (Signed)
History     CSN: [id number]  Arrival date & time 02/09/12  1156   First MD Initiated Contact with Patient 02/09/12 1210      HPI Patient reports for the last 3 days has had lower abdominal pain. Reports pain has been constant. Associated with black stools, blood in emesis, dizzines, and weakness. Was advised to see Dr. Elnoria Howard and had a colonoscopy yesterday. Colonoscopy indicated a sessile polyp and large internal hemorrhoids. Patient denies fever , urinary symptoms, vaginal symptoms. Reports a history of a significant amount of colon removed after diagnosis of colon cancer, and hysterectomy  Patient is a 56 y.o. female presenting with abdominal pain. The history is provided by the patient.  Abdominal Pain The primary symptoms of the illness include abdominal pain, nausea and vomiting (blood in vomit). The primary symptoms of the illness do not include fever, shortness of breath, diarrhea, dysuria or vaginal discharge. Episode onset: 3 days ago. The onset of the illness was gradual. The problem has been gradually worsening.  The abdominal pain is located in the suprapubic region, epigastric region and LLQ. The abdominal pain does not radiate. The abdominal pain is relieved by nothing. Exacerbated by: palpation.  Vomiting appearance: dark emesis, light coffee grouds.  The patient states that she believes she is currently not pregnant. Symptoms associated with the illness do not include chills, constipation, urgency, hematuria, frequency or back pain. Associated medical issues comments: colon surgery.    Past Medical History  Diagnosis Date  . Hypertension   . Neuromuscular disorder   . Cancer     Right colon cancer T3  . Tobacco use disorder   . Seizures     withdraw from xanax    Past Surgical History  Procedure Date  . Colon surgery 2006  . Abdominal hysterectomy 1988    cancer  . Broken arm 2012    left  . Clavicle surgery 2012    left  . Tonsillectomy   . Tunneled venous port  placement 2006  . Port-a-cath removal 10/31/2011    Procedure: REMOVAL PORT-A-CATH;  Surgeon: Atilano Ina, MD;  Location: [facility];  Service: General;  Laterality: N/A;  port-a-cath removal    Family History  Problem Relation Age of Onset  . Cancer Father     prostate    History  Substance Use Topics  . Smoking status: Current Everyday Smoker -- 1.0 packs/day  . Smokeless tobacco: Not on file  . Alcohol Use: No    OB History    Grav Para Term Preterm Abortions TAB SAB Ect Mult Living                  Review of Systems  Constitutional: Negative for fever and chills.  Respiratory: Negative for shortness of breath.   Cardiovascular: Negative for chest pain.  Gastrointestinal: Positive for nausea, vomiting (blood in vomit), abdominal pain and blood in stool. Negative for diarrhea and constipation.  Genitourinary: Negative for dysuria, urgency, frequency, hematuria, flank pain, vaginal discharge and vaginal pain.  Musculoskeletal: Negative for back pain.  Neurological: Positive for dizziness and weakness.  All other systems reviewed and are negative.    Allergies  Review of patient's allergies indicates no known allergies.  Home Medications   Current Outpatient Rx  Name Route Sig Dispense Refill  . CLONAZEPAM 2 MG PO TABS Oral Take 2 mg by mouth 2 (two) times daily.     Marland Kitchen LISINOPRIL 20 MG PO TABS Oral Take 20 mg  by mouth daily.      Marland Kitchen PAROXETINE HCL 20 MG PO TABS Oral Take 20 mg by mouth every morning.        BP 118/33  Pulse 107  Temp 97.8 F (36.6 C)  Resp 12  SpO2 100%  Physical Exam  Vitals reviewed. Constitutional: She is oriented to person, place, and time. Vital signs are normal. She appears well-developed and well-nourished.  HENT:  Head: Normocephalic and atraumatic.  Eyes: Conjunctivae are normal. Pupils are equal, round, and reactive to light.  Neck: Normal range of motion. Neck supple.  Cardiovascular: Normal rate, regular rhythm  and normal heart sounds.  Exam reveals no friction rub.   No murmur heard. Pulmonary/Chest: Effort normal and breath sounds normal. She has no wheezes. She has no rhonchi. She has no rales. She exhibits no tenderness.  Abdominal: Soft. Normal appearance and bowel sounds are normal. She exhibits no distension and no mass. There is no hepatosplenomegaly. There is generalized tenderness (diffuse). There is no rigidity, no rebound, no guarding, no CVA tenderness, no tenderness at McBurney's point and negative Murphy's sign.  Genitourinary: Rectal exam shows external hemorrhoid. Rectal exam shows no fissure, no mass, no tenderness and anal tone normal. Guaiac positive stool.  Musculoskeletal: Normal range of motion.  Neurological: She is alert and oriented to person, place, and time. Coordination normal.  Skin: Skin is warm and dry. No rash noted. No erythema. No pallor.    ED Course  Procedures   Results for orders placed during the hospital encounter of 02/09/12  CBC      Component Value Range   WBC 18.7 (*) 4.0 - 10.5 (K/uL)   RBC 3.02 (*) 3.87 - 5.11 (MIL/uL)   Hemoglobin 10.4 (*) 12.0 - 15.0 (g/dL)   HCT 29.5 (*) 62.1 - 46.0 (%)   MCV 99.0  78.0 - 100.0 (fL)   MCH 34.4 (*) 26.0 - 34.0 (pg)   MCHC 34.8  30.0 - 36.0 (g/dL)   RDW 30.8 (*) 65.7 - 15.5 (%)   Platelets 326  150 - 400 (K/uL)  DIFFERENTIAL      Component Value Range   Neutrophils Relative 76  43 - 77 (%)   Neutro Abs 14.3 (*) 1.7 - 7.7 (K/uL)   Lymphocytes Relative 15  12 - 46 (%)   Lymphs Abs 2.8  0.7 - 4.0 (K/uL)   Monocytes Relative 8  3 - 12 (%)   Monocytes Absolute 1.6 (*) 0.1 - 1.0 (K/uL)   Eosinophils Relative 0  0 - 5 (%)   Eosinophils Absolute 0.0  0.0 - 0.7 (K/uL)   Basophils Relative 0  0 - 1 (%)   Basophils Absolute 0.0  0.0 - 0.1 (K/uL)  COMPREHENSIVE METABOLIC PANEL      Component Value Range   Sodium 144  135 - 145 (mEq/L)   Potassium 3.4 (*) 3.5 - 5.1 (mEq/L)   Chloride 107  96 - 112 (mEq/L)   CO2 20   19 - 32 (mEq/L)   Glucose, Bld 111 (*) 70 - 99 (mg/dL)   BUN 55 (*) 6 - 23 (mg/dL)   Creatinine, Ser 8.46 (*) 0.50 - 1.10 (mg/dL)   Calcium 9.1  8.4 - 96.2 (mg/dL)   Total Protein 6.1  6.0 - 8.3 (g/dL)   Albumin 3.6  3.5 - 5.2 (g/dL)   AST 20  0 - 37 (U/L)   ALT 29  0 - 35 (U/L)   Alkaline Phosphatase 128 (*) 39 - 117 (  U/L)   Total Bilirubin 0.5  0.3 - 1.2 (mg/dL)   GFR calc non Af Amer 46 (*) >90 (mL/min)   GFR calc Af Amer 53 (*) >90 (mL/min)  TYPE AND SCREEN      Component Value Range   ABO/RH(D) O POS     Antibody Screen NEG     Sample Expiration 02/12/2012    OCCULT BLOOD, POC DEVICE      Component Value Range   Fecal Occult Bld POSITIVE    ABO/RH      Component Value Range   ABO/RH(D) O POS     No results found.   MDM    2:54 PM Unable to obtain NG tube. Patient is Hemoccult positive, mildly anemic. Elevated BUN and creatinine. Patient's hemoglobin has dropped from 15-10 in 2 months. BUN is 55 and creatinine is 1.27. Currently hemodynamically stable. Spoke with Dr. Virginia Rochester. Except patient for admission. Requested telemetry bed, team 1, Dr. Wylie Hail, PA-C 02/09/12 1456

## 2012-02-09 NOTE — ED Notes (Addendum)
EMS-nasuea vomiting abdominal pain x3 associated with dark stool, blood in emesis, dizziness and weakness. 22(G)right wrist. 4mg  of zofran. Lower quadrant pain. 150/98. HR 104. Pt was seen at [facility], MD there had contacted Dr Elnoria Howard with GI according to paperwork.

## 2012-02-10 ENCOUNTER — Encounter (HOSPITAL_COMMUNITY): Payer: Self-pay | Admitting: *Deleted

## 2012-02-10 ENCOUNTER — Encounter (HOSPITAL_COMMUNITY)
Admission: EM | Disposition: A | Discharge status: Home / Self Care | Payer: Self-pay | Source: Home / Self Care | Attending: Internal Medicine

## 2012-02-10 HISTORY — PX: ESOPHAGOGASTRODUODENOSCOPY: SHX5428

## 2012-02-10 LAB — BASIC METABOLIC PANEL
BUN: 26 mg/dL — ABNORMAL HIGH (ref 6–23)
Chloride: 108 mEq/L (ref 96–112)
Glucose, Bld: 98 mg/dL (ref 70–99)
Potassium: 4 mEq/L (ref 3.5–5.1)

## 2012-02-10 LAB — CBC
HCT: 31.7 % — ABNORMAL LOW (ref 36.0–46.0)
HCT: 30.9 % — ABNORMAL LOW (ref 36.0–46.0)
Hemoglobin: 10.7 g/dL — ABNORMAL LOW (ref 12.0–15.0)
Hemoglobin: 10.6 g/dL — ABNORMAL LOW (ref 12.0–15.0)
MCH: 31.5 pg (ref 26.0–34.0)
MCHC: 34.3 g/dL (ref 30.0–36.0)
RBC: 3.45 MIL/uL — ABNORMAL LOW (ref 3.87–5.11)
RBC: 3.37 MIL/uL — ABNORMAL LOW (ref 3.87–5.11)
WBC: 11 10*3/uL — ABNORMAL HIGH (ref 4.0–10.5)

## 2012-02-10 SURGERY — EGD (ESOPHAGOGASTRODUODENOSCOPY)
Anesthesia: Moderate Sedation

## 2012-02-10 MED ORDER — NICOTINE 21 MG/24HR TD PT24: 1.0000 | MEDICATED_PATCH | Freq: Every day | TRANSDERMAL | Status: AC

## 2012-02-10 MED ORDER — BUTAMBEN-TETRACAINE-BENZOCAINE 2-2-14 % EX AERO
INHALATION_SPRAY | CUTANEOUS | Status: DC | PRN
  Administered 2012-02-10: 2 via TOPICAL

## 2012-02-10 MED ORDER — MIDAZOLAM HCL 10 MG/2ML IJ SOLN
INTRAMUSCULAR | Status: DC | PRN
  Administered 2012-02-10 (×4): 2 mg via INTRAVENOUS

## 2012-02-10 MED ORDER — OMEPRAZOLE 20 MG PO CPDR: 40.0000 mg | DELAYED_RELEASE_CAPSULE | Freq: Every day | ORAL | Status: DC

## 2012-02-10 MED ORDER — FENTANYL NICU IV SYRINGE 50 MCG/ML
INJECTION | INTRAMUSCULAR | Status: DC | PRN
  Administered 2012-02-10 (×3): 25 ug via INTRAVENOUS

## 2012-02-10 MED ORDER — VITAMIN B-1 100 MG PO TABS: 100.0000 mg | ORAL_TABLET | Freq: Every day | ORAL | Status: AC

## 2012-02-10 MED ORDER — DIPHENHYDRAMINE HCL 50 MG/ML IJ SOLN
INTRAMUSCULAR | Status: AC
  Filled 2012-02-10: qty 1

## 2012-02-10 MED ORDER — FOLIC ACID 1 MG PO TABS: 1.0000 mg | ORAL_TABLET | Freq: Every day | ORAL | Status: AC

## 2012-02-10 MED ORDER — MIDAZOLAM HCL 10 MG/2ML IJ SOLN
INTRAMUSCULAR | Status: AC
  Filled 2012-02-10: qty 2

## 2012-02-10 MED ORDER — FENTANYL CITRATE 0.05 MG/ML IJ SOLN
INTRAMUSCULAR | Status: AC
Start: 2012-02-10 — End: 2012-02-10
  Filled 2012-02-10: qty 2

## 2012-02-10 NOTE — Interval H&P Note (Signed)
History and Physical Interval Note:  02/10/2012 9:10 AM  Laura Farley  has presented today for surgery, with the diagnosis of GI bleed  The various methods of treatment have been discussed with the patient and family. After consideration of risks, benefits and other options for treatment, the patient has consented to  Procedure(s) (LRB): ESOPHAGOGASTRODUODENOSCOPY (EGD) (N/A) as a surgical intervention .  The patients' history has been reviewed, patient examined, no change in status, stable for surgery.  I have reviewed the patients' chart and labs.  Questions were answered to the patient's satisfaction.     Gaynelle Pastrana D

## 2012-02-10 NOTE — Op Note (Signed)
Eligha Bridegroom [facility] [address] [place], [place]  [address]  OPERATIVE PROCEDURE REPORT  PATIENT:  Laura Farley, Laura Farley  MR#:  [id number] BIRTHDATE:  02/09/1956  GENDER:  female ENDOSCOPIST:  Jeani Hawking, MD ASSISTANT:  Kandice Robinsons and Judithann Sauger, RN PROCEDURE DATE:  02/10/2012 PROCEDURE:  EGD, diagnostic [phone] ASA CLASS:  Class II INDICATIONS:  Melena, anemia, nausea/vomiting MEDICATIONS:  Fentanyl 75 mcg IV, Versed 8 mg IV  DESCRIPTION OF PROCEDURE:   After the risks benefits and alternatives of the procedure were thoroughly explained, informed consent was obtained.  The Pentax Gastroscope [id number] and [id number]) endoscope was introduced through the mouth and advanced to the second portion of the duodenum, without limitations.  The instrument was slowly withdrawn as the mucosa was fully examined. <<PROCEDUREIMAGES>>  FINDINGS:  Small esophageal varices were identified. No evidence of any active bleeding. Teh gastric mucosal revealed portal hypertensive gastropathy. A small hiatal hernia was also identified. In the antrum there was evidence of hematin, but no overt site of bleeding. No other abnormalities identified. Retroflexed views revealed no abnormalities.    The scope was then withdrawn from the patient and the procedure terminated.  COMPLICATIONS:  None  IMPRESSION:  1) Small esophageal varices. 2) Portal hypertensive gastropathy. 3) Hiatal hernia. 4) Hematin. RECOMMENDATIONS:  1) Follow HGB.  If HGB declines or there is evidence of melena a colonoscopy will be pursued. 2) Cause for her abdominal pain and N/V is unclear.  ______________________________ Jeani Hawking, MD  n. Rosalie DoctorJeani Hawking at 02/10/2012 09:39 AM  Lupita Dawn, [phone]

## 2012-02-10 NOTE — Progress Notes (Signed)
Laura Farley to be D/C'd Home per MD order.  Discussed with the patient and all questions fully answered.   Marvette, Schamp [facility]  Home Medication Instructions ZOX:[id number]   Printed on:02/10/12 1525  Medication Information                    PARoxetine (PAXIL) 20 MG tablet Take 20 mg by mouth every morning.             lisinopril (PRINIVIL,ZESTRIL) 20 MG tablet Take 20 mg by mouth daily.             clonazePAM (KLONOPIN) 2 MG tablet Take 2 mg by mouth 2 (two) times daily.            nicotine (NICODERM CQ - DOSED IN MG/24 HOURS) 21 mg/24hr patch Place 1 patch onto the skin daily.           omeprazole (PRILOSEC) 20 MG capsule Take 2 capsules (40 mg total) by mouth daily.           thiamine (VITAMIN B-1) 100 MG tablet Take 1 tablet (100 mg total) by mouth daily.           folic acid (FOLVITE) 1 MG tablet Take 1 tablet (1 mg total) by mouth daily.             VVS, Skin clean, dry and intact without evidence of skin break down, no evidence of skin tears noted. IV catheter discontinued intact. Site without signs and symptoms of complications. Dressing and pressure applied.  An After Visit Summary was printed and given to the patient. Follow up appointments , new prescriptions and medication administration times given Patient escorted via WC, and D/C home via private auto with husband.  Cindra Eves, RN 02/10/2012 3:25 PM

## 2012-02-10 NOTE — Discharge Instructions (Signed)
Gastrointestinal Bleeding Gastrointestinal (GI) bleeding is bleeding from the gut or any place between your mouth and anus. If bleeding is slow, you may be allowed to go home. If there is a lot of bleeding, hospitalization and observation are often required. SYMPTOMS   You vomit bright red blood or material that looks like coffee grounds.   You have blood in your stools or the stools look black and tarry.  DIAGNOSIS  Your caregiver may diagnose your condition by taking a history and a physical exam. More tests may be needed, including:  X-rays.   EGD (esophagogastroduodenoscopy), which looks at your esophagus, stomach, and small bowel through a flexible telescope-like instrument.   Colonoscopy, which looks at your colon/large bowel through a flexible telescope-like instrument.   Biopsies, which remove a small sample of tissue to examine under a microscope.  Finding out the results of your test Not all test results are available during your visit. If your test results are not back during the visit, make an appointment with your caregiver to find out the results. Do not assume everything is normal if you have not heard from your caregiver or the medical facility. It is important for you to follow up on all of your test results. HOME CARE INSTRUCTIONS   Follow instructions as suggested by your caregiver regarding medicines. Do not take aspirin, drink alcohol, or take medicines for pain and arthritis unless your caregiver says it is okay.   Get the suggested follow-up care when the tests are done.  SEEK IMMEDIATE MEDICAL CARE IF:   Your bleeding increases or you become lightheaded, weak, or pass out (faint).   You experience severe cramps in your stomach, back, or belly (abdomen).   You pass large clots.   The problems which brought you in for medical care get worse.  MAKE SURE YOU:   Understand these instructions.   Will watch your condition.   Will get help right away if you are  not doing well or get worse.  Document Released: 12/01/2000 Document Revised: 08/16/2011 Document Reviewed: 07/23/2008 [employer] Patient Information 2012 [employer]. 

## 2012-02-11 DIAGNOSIS — Z720 Tobacco use: Secondary | ICD-10-CM | POA: Diagnosis present

## 2012-02-11 DIAGNOSIS — F1010 Alcohol abuse, uncomplicated: Secondary | ICD-10-CM | POA: Diagnosis present

## 2012-02-11 LAB — TYPE AND SCREEN
Antibody Screen: NEGATIVE
Unit division: 0

## 2012-02-11 NOTE — Discharge Summary (Signed)
Patient ID: Laura Farley MRN: [id number] DOB/AGE: [birth year] 58 y.o. Primary Care Physician:RAMACHANDRAN,AJITH, MD, MD Admit date: 02/09/2012 Discharge date: 02/11/2012  Issues for follow up: 1. Alcohol use 2. CBC  Discharge Diagnoses:   Principal Problem:  *GI bleed Active Problems:  Anemia due to blood loss, acute  Colon cancer  Hypertension  Anxiety  Diarrhea portal gastropathy    Medication List  As of 02/11/2012  7:25 AM   START taking these medications         folic acid 1 MG tablet   Commonly known as: FOLVITE   Take 1 tablet (1 mg total) by mouth daily.      nicotine 21 mg/24hr patch   Commonly known as: NICODERM CQ - dosed in mg/24 hours   Place 1 patch onto the skin daily.      omeprazole 20 MG capsule   Commonly known as: PRILOSEC   Take 2 capsules (40 mg total) by mouth daily.      thiamine 100 MG tablet   Commonly known as: VITAMIN B-1   Take 1 tablet (100 mg total) by mouth daily.         CONTINUE taking these medications         clonazePAM 2 MG tablet   Commonly known as: KLONOPIN      lisinopril 20 MG tablet   Commonly known as: PRINIVIL,ZESTRIL      PARoxetine 20 MG tablet   Commonly known as: PAXIL          Where to get your medications    These are the prescriptions that you need to pick up.   You may get these medications from any pharmacy.         folic acid 1 MG tablet   nicotine 21 mg/24hr patch   omeprazole 20 MG capsule   thiamine 100 MG tablet            Discharged Condition:good, tolerating regular diet     Consults:Dr. Elnoria Howard - GI  Significant Diagnostic Studies: Dg Abd 2 Views  02/09/2012  *RADIOLOGY REPORT*  Clinical Data: Abdominal pain, vomiting  ABDOMEN - 2 VIEW  Comparison: CT abdomen pelvis of 03/29/2010  Findings: Supine and erect views of the abdomen show bowel gas within the colon without distention.  No definite bowel obstruction is seen, with air noted to the rectum.  There is slight gaseous  distention of the stomach.  No free intraperitoneal air is seen. No opaque calculi noted.  IMPRESSION: No evidence of bowel obstruction.  No free air.  Original Report Authenticated By: Juline Patch, M.D.    Lab Results: Results for orders placed during the hospital encounter of 02/09/12 (from the past 48 hour(s))  CBC     Status: Abnormal   Collection Time   02/09/12 12:28 PM      Component Value Range Comment   WBC 18.7 (*) 4.0 - 10.5 (K/uL)    RBC 3.02 (*) 3.87 - 5.11 (MIL/uL)    Hemoglobin 10.4 (*) 12.0 - 15.0 (g/dL)    HCT 86.5 (*) 78.4 - 46.0 (%)    MCV 99.0  78.0 - 100.0 (fL)    MCH 34.4 (*) 26.0 - 34.0 (pg)    MCHC 34.8  30.0 - 36.0 (g/dL)    RDW 69.6 (*) 29.5 - 15.5 (%)    Platelets 326  150 - 400 (K/uL)   DIFFERENTIAL     Status: Abnormal   Collection Time   02/09/12 12:28 PM  Component Value Range Comment   Neutrophils Relative 76  43 - 77 (%)    Neutro Abs 14.3 (*) 1.7 - 7.7 (K/uL)    Lymphocytes Relative 15  12 - 46 (%)    Lymphs Abs 2.8  0.7 - 4.0 (K/uL)    Monocytes Relative 8  3 - 12 (%)    Monocytes Absolute 1.6 (*) 0.1 - 1.0 (K/uL)    Eosinophils Relative 0  0 - 5 (%)    Eosinophils Absolute 0.0  0.0 - 0.7 (K/uL)    Basophils Relative 0  0 - 1 (%)    Basophils Absolute 0.0  0.0 - 0.1 (K/uL)   COMPREHENSIVE METABOLIC PANEL     Status: Abnormal   Collection Time   02/09/12 12:28 PM      Component Value Range Comment   Sodium 144  135 - 145 (mEq/L)    Potassium 3.4 (*) 3.5 - 5.1 (mEq/L)    Chloride 107  96 - 112 (mEq/L)    CO2 20  19 - 32 (mEq/L)    Glucose, Bld 111 (*) 70 - 99 (mg/dL)    BUN 55 (*) 6 - 23 (mg/dL)    Creatinine, Ser 9.56 (*) 0.50 - 1.10 (mg/dL)    Calcium 9.1  8.4 - 10.5 (mg/dL)    Total Protein 6.1  6.0 - 8.3 (g/dL)    Albumin 3.6  3.5 - 5.2 (g/dL)    AST 20  0 - 37 (U/L)    ALT 29  0 - 35 (U/L)    Alkaline Phosphatase 128 (*) 39 - 117 (U/L)    Total Bilirubin 0.5  0.3 - 1.2 (mg/dL)    GFR calc non Af Amer 46 (*) >90 (mL/min)     GFR calc Af Amer 53 (*) >90 (mL/min)   TYPE AND SCREEN     Status: Normal (Preliminary result)   Collection Time   02/09/12 12:36 PM      Component Value Range Comment   ABO/RH(D) O POS      Antibody Screen NEG      Sample Expiration 02/12/2012      Unit Number [id number]      Blood Component Type RED CELLS,LR      Unit division 00      Status of Unit ISSUED,FINAL      Transfusion Status OK TO TRANSFUSE      Crossmatch Result Compatible      Unit Number [id number]      Blood Component Type RED CELLS,LR      Unit division 00      Status of Unit ISSUED      Transfusion Status OK TO TRANSFUSE      Crossmatch Result Compatible     ABO/RH     Status: Normal   Collection Time   02/09/12 12:36 PM      Component Value Range Comment   ABO/RH(D) O POS     OCCULT BLOOD, POC DEVICE     Status: Normal   Collection Time   02/09/12  1:03 PM      Component Value Range Comment   Fecal Occult Bld POSITIVE     URINALYSIS, ROUTINE W REFLEX MICROSCOPIC     Status: Abnormal   Collection Time   02/09/12  3:38 PM      Component Value Range Comment   Color, Urine YELLOW  YELLOW     APPearance CLEAR  CLEAR     Specific Gravity, Urine 1.014  1.005 -  1.030     pH 5.5  5.0 - 8.0     Glucose, UA NEGATIVE  NEGATIVE (mg/dL)    Hgb urine dipstick MODERATE (*) NEGATIVE     Bilirubin Urine NEGATIVE  NEGATIVE     Ketones, ur NEGATIVE  NEGATIVE (mg/dL)    Protein, ur NEGATIVE  NEGATIVE (mg/dL)    Urobilinogen, UA 0.2  0.0 - 1.0 (mg/dL)    Nitrite NEGATIVE  NEGATIVE     Leukocytes, UA NEGATIVE  NEGATIVE    URINE MICROSCOPIC-ADD ON     Status: Abnormal   Collection Time   02/09/12  3:38 PM      Component Value Range Comment   Squamous Epithelial / LPF FEW (*) RARE     WBC, UA 0-2  <3 (WBC/hpf)    RBC / HPF 3-6  <3 (RBC/hpf)    Bacteria, UA RARE  RARE    PREPARE RBC (CROSSMATCH)     Status: Normal   Collection Time   02/09/12  6:30 PM      Component Value Range Comment   Order Confirmation ORDER PROCESSED  BY BLOOD BANK     CBC     Status: Abnormal   Collection Time   02/09/12  6:36 PM      Component Value Range Comment   WBC 16.0 (*) 4.0 - 10.5 (K/uL)    RBC 2.61 (*) 3.87 - 5.11 (MIL/uL)    Hemoglobin 9.0 (*) 12.0 - 15.0 (g/dL)    HCT 16.1 (*) 09.6 - 46.0 (%)    MCV 100.4 (*) 78.0 - 100.0 (fL)    MCH 34.5 (*) 26.0 - 34.0 (pg)    MCHC 34.4  30.0 - 36.0 (g/dL)    RDW 04.5 (*) 40.9 - 15.5 (%)    Platelets 261  150 - 400 (K/uL)   CBC     Status: Abnormal   Collection Time   02/10/12  6:30 AM      Component Value Range Comment   WBC 11.0 (*) 4.0 - 10.5 (K/uL)    RBC 3.45 (*) 3.87 - 5.11 (MIL/uL)    Hemoglobin 10.7 (*) 12.0 - 15.0 (g/dL)    HCT 81.1 (*) 91.4 - 46.0 (%)    MCV 91.9  78.0 - 100.0 (fL)    MCH 31.0  26.0 - 34.0 (pg)    MCHC 33.8  30.0 - 36.0 (g/dL)    RDW 78.2 (*) 95.6 - 15.5 (%)    Platelets 153  150 - 400 (K/uL) DELTA CHECK NOTED  BASIC METABOLIC PANEL     Status: Abnormal   Collection Time   02/10/12  6:30 AM      Component Value Range Comment   Sodium 141  135 - 145 (mEq/L)    Potassium 4.0  3.5 - 5.1 (mEq/L)    Chloride 108  96 - 112 (mEq/L)    CO2 28  19 - 32 (mEq/L)    Glucose, Bld 98  70 - 99 (mg/dL)    BUN 26 (*) 6 - 23 (mg/dL) DELTA CHECK NOTED   Creatinine, Ser 0.91  0.50 - 1.10 (mg/dL)    Calcium 9.0  8.4 - 10.5 (mg/dL)    GFR calc non Af Amer 69 (*) >90 (mL/min)    GFR calc Af Amer 80 (*) >90 (mL/min)   CBC     Status: Abnormal   Collection Time   02/10/12 10:55 AM      Component Value Range Comment   WBC 9.6  4.0 - 10.5 (K/uL)    RBC 3.37 (*) 3.87 - 5.11 (MIL/uL)    Hemoglobin 10.6 (*) 12.0 - 15.0 (g/dL)    HCT 16.1 (*) 09.6 - 46.0 (%)    MCV 91.7  78.0 - 100.0 (fL)    MCH 31.5  26.0 - 34.0 (pg)    MCHC 34.3  30.0 - 36.0 (g/dL)    RDW 04.5 (*) 40.9 - 15.5 (%)    Platelets 148 (*) 150 - 400 (K/uL)    No results found for this or any previous visit (from the past 240 hour(s)).  EGD by Dr. Darcel Smalling esophageal varices were identified. No evidence   of any active bleeding. Teh gastric mucosal revealed portal  hypertensive gastropathy. A small hiatal hernia was also  identified. In the antrum there was evidence of hematin, but no  overt site of bleeding. No other abnormalities identified.  Retroflexed views revealed no abnormalities. The scope was then  withdrawn from the patient and the procedure terminated.  COMPLICATIONS: None  IMPRESSION: 1) Small esophageal varices.  2) Portal hypertensive gastropathy.  3) Hiatal hernia.  4) Hematin.   Hospital Course:  Ms. Couchman is a 56 year old woman with history of alcohol abuse and tobacco abuse, who presented to the emergency room with complaints of nausea vomiting and dark stools with diarrhea. She was diagnosed with upper gastrointestinal bleeding and acute blood loss anemia and was started on intravenous Protonix intravenous fluids. She received 2 units of packed red blood cells and she remained medically stable. Upper endoscopy performed on day 2 of admission did not reveal active bleeding. Patient's diet was advanced to regular diet and she tolerated without problems. She was prescribed a proton pump inhibitor on discharge and was told to quit drinking alcohol he remains sober. She will followup with her primary care physician for routine blood work and with Dr. Elnoria Howard if symptoms persist.  Discharge Exam: Blood pressure 147/83, pulse 82, temperature 98.1 F (36.7 C), temperature source Oral, resp. rate 18, height 5\' 1"  (1.549 m), weight 47.446 kg (104 lb 9.6 oz), SpO2 98.00%. Alert and oriented x3 CVS: RRR RS: CTAB  Disposition: home    Follow-up Information    Follow up with [facility], [place]. Schedule an appointment as soon as possible for a visit today.      Follow up with Theda Belfast, MD.   Contact information:   [address] [place] [address] [phone]          Signed: Lonia Blood 02/11/2012, 7:25 AM

## 2012-02-12 ENCOUNTER — Encounter (HOSPITAL_COMMUNITY): Payer: Self-pay | Admitting: Gastroenterology

## 2012-02-12 NOTE — Progress Notes (Signed)
Utilization Review Completed.Laura Farley T2/25/2013   

## 2012-04-30 IMAGING — CR DG SHOULDER 1V*L*
2 series · 2 of 2 positions shown · non-contrast
Comparison: Plain films 06/25/2011.

CLINICAL DATA: Status post surgery.

PORTABLE LEFT SHOULDER - 2+ VIEW

[view not recorded (1 of 2)]
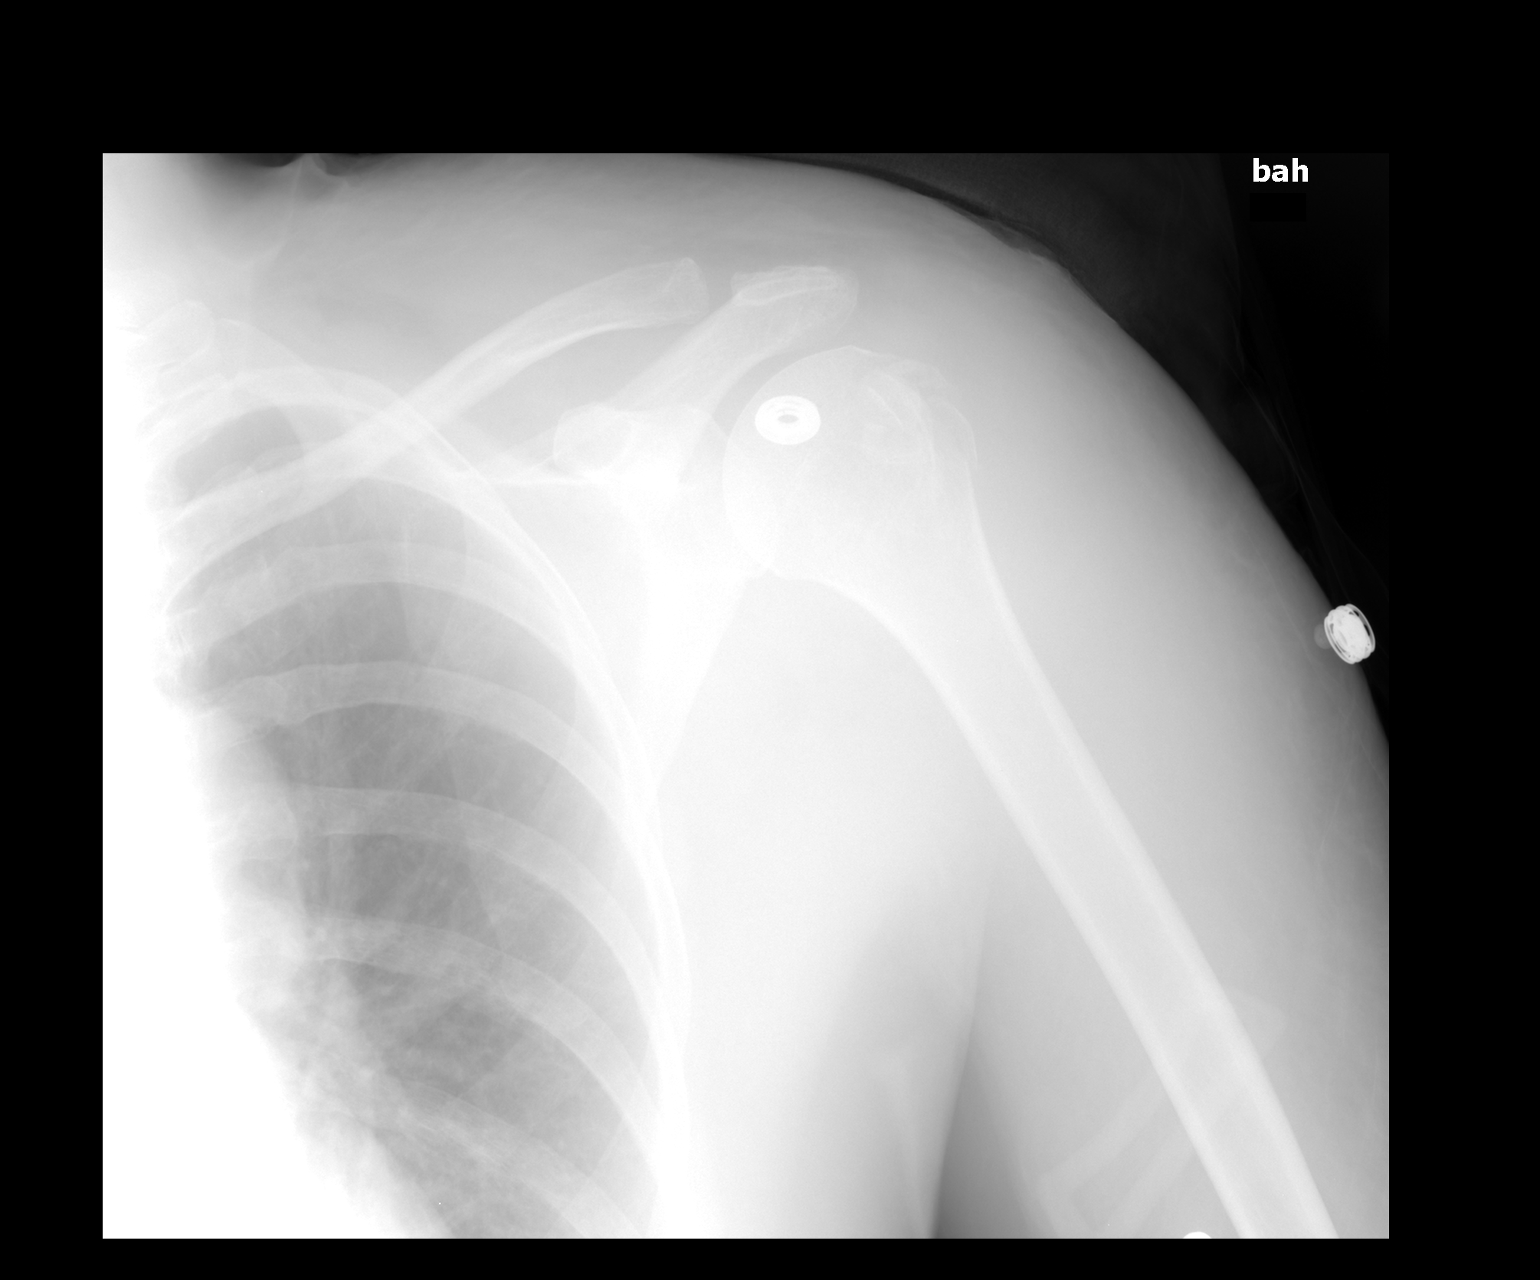

[view not recorded (2 of 2)]
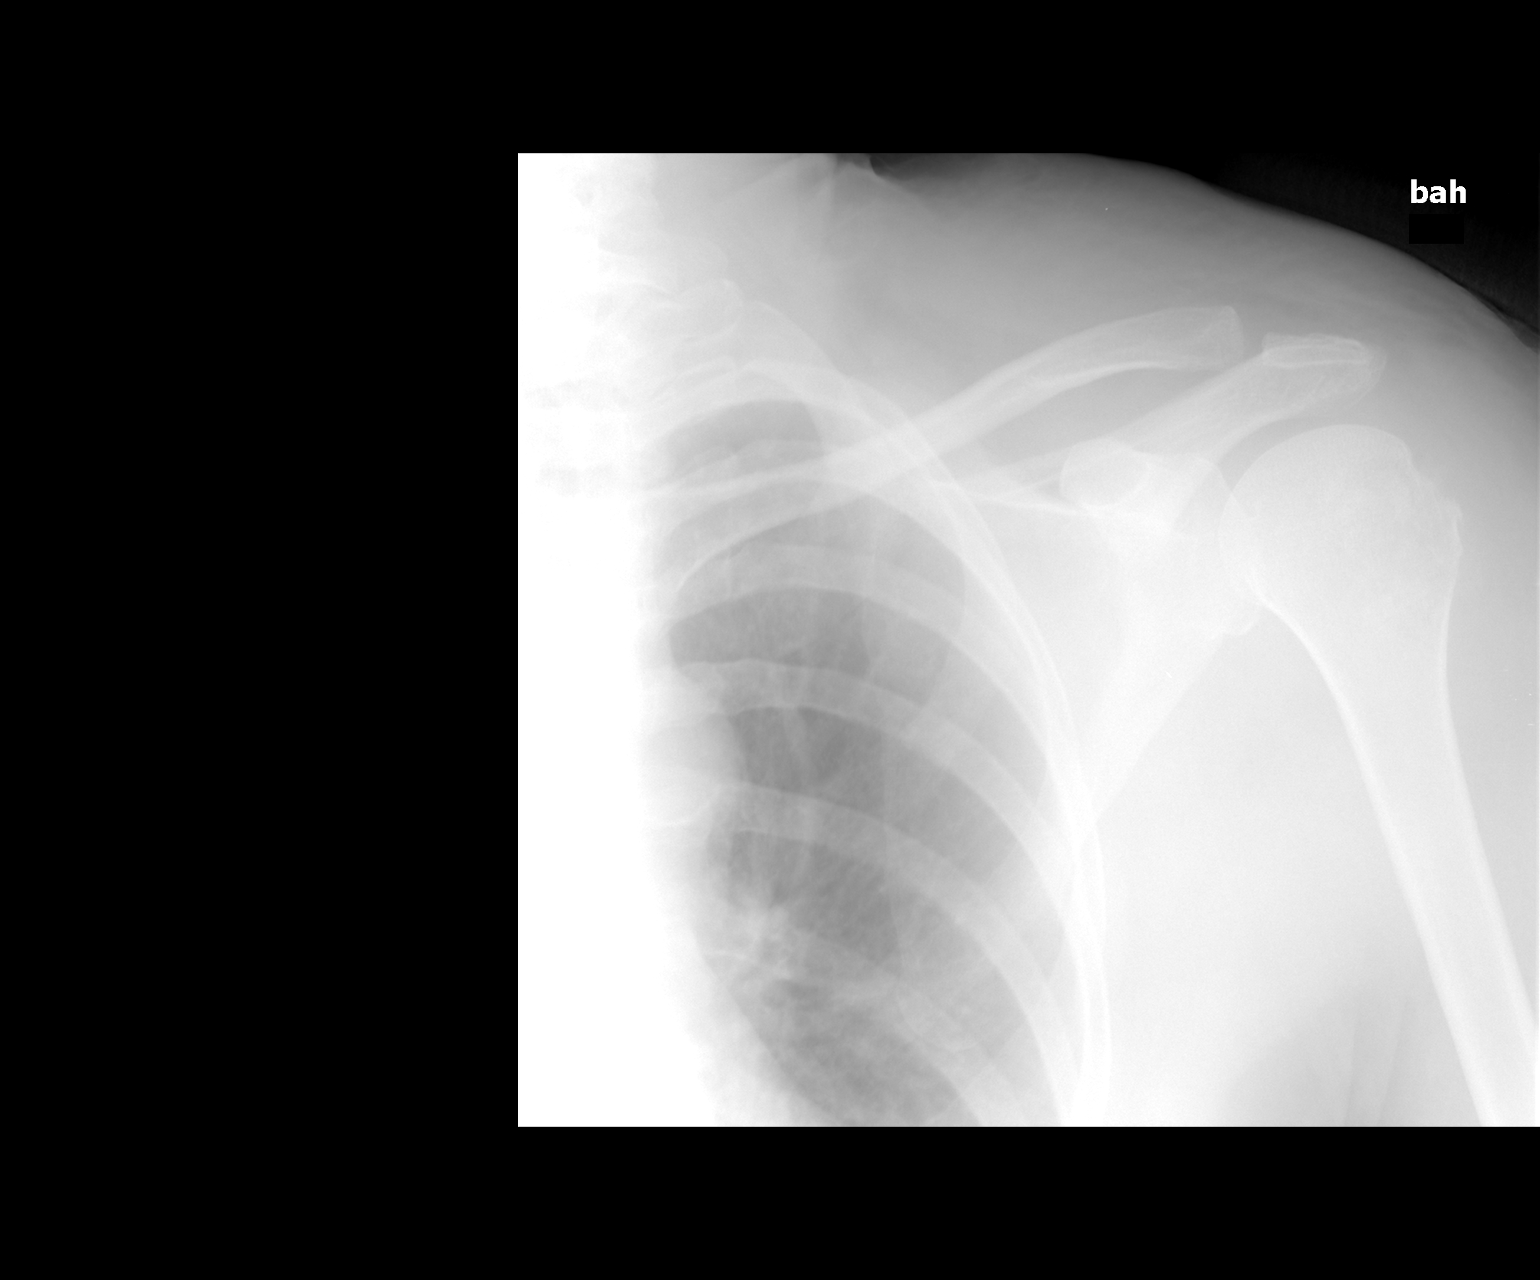

[2 of 2 positions shown; findings below may reference images not displayed]

FINDINGS: Hill-Sachs deformity is noted as on the prior study.  No
acute fracture.  Humerus appears located and the acromioclavicular
joint is intact.
IMPRESSION: Hill-Sachs deformity.  No acute finding.

## 2013-05-18 DIAGNOSIS — K55069 Acute infarction of intestine, part and extent unspecified: Secondary | ICD-10-CM

## 2013-05-18 HISTORY — DX: Acute infarction of intestine, part and extent unspecified: K55.069

## 2013-05-20 ENCOUNTER — Inpatient Hospital Stay (HOSPITAL_COMMUNITY)
Admission: EM | Admit: 2013-05-20 | Discharge: 2013-05-25 | DRG: 394 | Disposition: A | Discharge status: Home / Self Care | Payer: Medicare Other | Attending: Internal Medicine | Admitting: Internal Medicine

## 2013-05-20 ENCOUNTER — Encounter (HOSPITAL_COMMUNITY): Payer: Self-pay | Admitting: *Deleted

## 2013-05-20 DIAGNOSIS — F419 Anxiety disorder, unspecified: Secondary | ICD-10-CM

## 2013-05-20 DIAGNOSIS — Z85038 Personal history of other malignant neoplasm of large intestine: Secondary | ICD-10-CM

## 2013-05-20 DIAGNOSIS — I8510 Secondary esophageal varices without bleeding: Secondary | ICD-10-CM | POA: Diagnosis present

## 2013-05-20 DIAGNOSIS — D6959 Other secondary thrombocytopenia: Secondary | ICD-10-CM | POA: Diagnosis not present

## 2013-05-20 DIAGNOSIS — Z79899 Other long term (current) drug therapy: Secondary | ICD-10-CM

## 2013-05-20 DIAGNOSIS — I10 Essential (primary) hypertension: Secondary | ICD-10-CM

## 2013-05-20 DIAGNOSIS — Z8542 Personal history of malignant neoplasm of other parts of uterus: Secondary | ICD-10-CM

## 2013-05-20 DIAGNOSIS — K55059 Acute (reversible) ischemia of intestine, part and extent unspecified: Principal | ICD-10-CM | POA: Diagnosis present

## 2013-05-20 DIAGNOSIS — R109 Unspecified abdominal pain: Secondary | ICD-10-CM

## 2013-05-20 DIAGNOSIS — K922 Gastrointestinal hemorrhage, unspecified: Secondary | ICD-10-CM

## 2013-05-20 DIAGNOSIS — K7030 Alcoholic cirrhosis of liver without ascites: Secondary | ICD-10-CM | POA: Diagnosis present

## 2013-05-20 DIAGNOSIS — F1010 Alcohol abuse, uncomplicated: Secondary | ICD-10-CM

## 2013-05-20 DIAGNOSIS — F17200 Nicotine dependence, unspecified, uncomplicated: Secondary | ICD-10-CM | POA: Diagnosis present

## 2013-05-20 DIAGNOSIS — K766 Portal hypertension: Secondary | ICD-10-CM | POA: Diagnosis present

## 2013-05-20 DIAGNOSIS — K859 Acute pancreatitis without necrosis or infection, unspecified: Secondary | ICD-10-CM

## 2013-05-20 DIAGNOSIS — K319 Disease of stomach and duodenum, unspecified: Secondary | ICD-10-CM | POA: Diagnosis present

## 2013-05-20 DIAGNOSIS — F329 Major depressive disorder, single episode, unspecified: Secondary | ICD-10-CM | POA: Diagnosis present

## 2013-05-20 DIAGNOSIS — F3289 Other specified depressive episodes: Secondary | ICD-10-CM | POA: Diagnosis present

## 2013-05-20 DIAGNOSIS — D539 Nutritional anemia, unspecified: Secondary | ICD-10-CM | POA: Diagnosis not present

## 2013-05-20 DIAGNOSIS — C189 Malignant neoplasm of colon, unspecified: Secondary | ICD-10-CM

## 2013-05-20 DIAGNOSIS — R197 Diarrhea, unspecified: Secondary | ICD-10-CM

## 2013-05-20 DIAGNOSIS — E876 Hypokalemia: Secondary | ICD-10-CM

## 2013-05-20 DIAGNOSIS — Z720 Tobacco use: Secondary | ICD-10-CM

## 2013-05-20 DIAGNOSIS — R188 Other ascites: Secondary | ICD-10-CM

## 2013-05-20 DIAGNOSIS — D62 Acute posthemorrhagic anemia: Secondary | ICD-10-CM

## 2013-05-20 DIAGNOSIS — K55069 Acute infarction of intestine, part and extent unspecified: Secondary | ICD-10-CM

## 2013-05-20 HISTORY — DX: Malignant neoplasm of uterus, part unspecified: C55

## 2013-05-20 LAB — LIPASE, BLOOD: Lipase: 191 U/L — ABNORMAL HIGH (ref 11–59)

## 2013-05-20 LAB — URINALYSIS, ROUTINE W REFLEX MICROSCOPIC
Ketones, ur: NEGATIVE mg/dL
Leukocytes, UA: NEGATIVE
Nitrite: NEGATIVE
Specific Gravity, Urine: 1.01 (ref 1.005–1.030)
pH: 7 (ref 5.0–8.0)

## 2013-05-20 LAB — URINE MICROSCOPIC-ADD ON

## 2013-05-20 LAB — COMPREHENSIVE METABOLIC PANEL
Albumin: 3.5 g/dL (ref 3.5–5.2)
BUN: 6 mg/dL (ref 6–23)
Calcium: 9.5 mg/dL (ref 8.4–10.5)
Creatinine, Ser: 0.75 mg/dL (ref 0.50–1.10)
Total Protein: 6.6 g/dL (ref 6.0–8.3)

## 2013-05-20 LAB — CBC WITH DIFFERENTIAL/PLATELET
Basophils Relative: 1 % (ref 0–1)
Eosinophils Absolute: 0.2 10*3/uL (ref 0.0–0.7)
HCT: 43.7 % (ref 36.0–46.0)
Hemoglobin: 15.1 g/dL — ABNORMAL HIGH (ref 12.0–15.0)
MCH: 34.4 pg — ABNORMAL HIGH (ref 26.0–34.0)
MCHC: 34.6 g/dL (ref 30.0–36.0)
Monocytes Absolute: 0.9 10*3/uL (ref 0.1–1.0)
Monocytes Relative: 14 % — ABNORMAL HIGH (ref 3–12)

## 2013-05-20 LAB — POCT I-STAT TROPONIN I

## 2013-05-20 MED ORDER — SODIUM CHLORIDE 0.9 % IV SOLN: INTRAVENOUS | Status: DC

## 2013-05-20 MED ORDER — HYDROMORPHONE HCL PF 1 MG/ML IJ SOLN: 1.0000 mg | INTRAMUSCULAR | Status: DC | PRN

## 2013-05-20 MED ORDER — SODIUM CHLORIDE 0.9 % IJ SOLN
3.0000 mL | Freq: Two times a day (BID) | INTRAMUSCULAR | Status: DC
  Administered 2013-05-20 – 2013-05-24 (×2): 3 mL via INTRAVENOUS

## 2013-05-20 MED ORDER — HYDRALAZINE HCL 20 MG/ML IJ SOLN: 10.0000 mg | INTRAMUSCULAR | Status: DC | PRN

## 2013-05-20 MED ORDER — CHLORHEXIDINE GLUCONATE 0.12 % MT SOLN
15.0000 mL | Freq: Two times a day (BID) | OROMUCOSAL | Status: DC
  Administered 2013-05-21 – 2013-05-25 (×6): 15 mL via OROMUCOSAL
  Filled 2013-05-20 (×11): qty 15

## 2013-05-20 MED ORDER — POTASSIUM CHLORIDE IN NACL 20-0.9 MEQ/L-% IV SOLN
INTRAVENOUS | Status: DC
  Administered 2013-05-20: via INTRAVENOUS
  Administered 2013-05-21: 1000 mL via INTRAVENOUS
  Administered 2013-05-21: 11:00:00 via INTRAVENOUS
  Administered 2013-05-21: 1000 mL via INTRAVENOUS
  Administered 2013-05-22: 07:00:00 via INTRAVENOUS
  Filled 2013-05-20 (×5): qty 1000

## 2013-05-20 MED ORDER — ONDANSETRON HCL 4 MG/2ML IJ SOLN
4.0000 mg | Freq: Once | INTRAMUSCULAR | Status: AC
  Administered 2013-05-20: 4 mg via INTRAVENOUS

## 2013-05-20 MED ORDER — PANTOPRAZOLE SODIUM 40 MG IV SOLR
40.0000 mg | INTRAVENOUS | Status: DC
  Administered 2013-05-20: 40 mg via INTRAVENOUS
  Filled 2013-05-20 (×2): qty 40

## 2013-05-20 MED ORDER — BIOTENE DRY MOUTH MT LIQD
15.0000 mL | Freq: Two times a day (BID) | OROMUCOSAL | Status: DC
  Administered 2013-05-21 – 2013-05-23 (×3): 15 mL via OROMUCOSAL

## 2013-05-20 MED ORDER — HYDROMORPHONE HCL PF 1 MG/ML IJ SOLN
1.0000 mg | Freq: Once | INTRAMUSCULAR | Status: AC
  Administered 2013-05-20: 1 mg via INTRAVENOUS
  Filled 2013-05-20: qty 1

## 2013-05-20 MED ORDER — ACETAMINOPHEN 650 MG RE SUPP: 650.0000 mg | Freq: Four times a day (QID) | RECTAL | Status: DC | PRN

## 2013-05-20 MED ORDER — ONDANSETRON HCL 4 MG PO TABS: 4.0000 mg | ORAL_TABLET | Freq: Four times a day (QID) | ORAL | Status: DC | PRN

## 2013-05-20 MED ORDER — ACETAMINOPHEN 325 MG PO TABS: 650.0000 mg | ORAL_TABLET | Freq: Four times a day (QID) | ORAL | Status: DC | PRN

## 2013-05-20 MED ORDER — HYDROMORPHONE HCL PF 1 MG/ML IJ SOLN
0.5000 mg | INTRAMUSCULAR | Status: DC | PRN
  Administered 2013-05-20 – 2013-05-22 (×11): 0.5 mg via INTRAVENOUS
  Filled 2013-05-20 (×11): qty 1

## 2013-05-20 MED ORDER — CLONAZEPAM 1 MG PO TABS: 2.0000 mg | ORAL_TABLET | Freq: Two times a day (BID) | ORAL | Status: DC | PRN

## 2013-05-20 MED ORDER — HYDROMORPHONE HCL PF 1 MG/ML IJ SOLN
1.0000 mg | Freq: Once | INTRAMUSCULAR | Status: AC
  Administered 2013-05-20: 1 mg via INTRAVENOUS

## 2013-05-20 MED ORDER — HYDROMORPHONE HCL PF 1 MG/ML IJ SOLN
1.0000 mg | INTRAMUSCULAR | Status: DC | PRN
  Filled 2013-05-20: qty 1

## 2013-05-20 MED ORDER — ONDANSETRON HCL 4 MG/2ML IJ SOLN: 4.0000 mg | Freq: Four times a day (QID) | INTRAMUSCULAR | Status: DC | PRN

## 2013-05-20 MED ORDER — ONDANSETRON HCL 4 MG/2ML IJ SOLN
4.0000 mg | Freq: Once | INTRAMUSCULAR | Status: AC
  Administered 2013-05-20: 4 mg via INTRAVENOUS
  Filled 2013-05-20: qty 2

## 2013-05-20 MED ORDER — ONDANSETRON HCL 4 MG/2ML IJ SOLN
4.0000 mg | Freq: Three times a day (TID) | INTRAMUSCULAR | Status: DC | PRN
  Filled 2013-05-20: qty 2

## 2013-05-20 MED ORDER — SODIUM CHLORIDE 0.9 % IV BOLUS (SEPSIS)
1000.0000 mL | Freq: Once | INTRAVENOUS | Status: AC
  Administered 2013-05-20: 1000 mL via INTRAVENOUS

## 2013-05-20 NOTE — ED Notes (Signed)
Pt is here with 2 week history of abdominal pain and bloating and pt states she gained 23 pounds in 2 weeks.  SHe has history of colon cancer.  Pt had scan today that showed a mesenteric vein clot.  Sent here for further evaluation

## 2013-05-20 NOTE — H&P (Signed)
[facility] History and Physical  Laura Farley ZOX:[id number] DOB: 12-02-1956 DOA: 05/20/2013  Referring physician: ER physician. PCP: Pcp Not In System Dr. Everlene Other who can be reached at [phone]. Specialists: Dr. Elnoria Howard.  Chief Complaint: Abdominal pain.  HPI: Laura Farley is a 56 y.o. female with a history of colon cancer status post right-sided hemicolectomy in 2005 and history of esophageal varices, hypertension tobacco and alcohol abuse presented the ER because of abdominal pain. Patient has been having abdominal pain diffuse for last few weeks with diarrhea. Denies any nausea vomiting. Patient had gone to her PCP who had ordered a CT abdomen and pelvis which was done in [place]. CT abdomen and pelvis as per the PCP showed superior mesenteric vein nonocclusive thrombosis and patient was referred to the ER. In the ER labs show elevated lipase. At this time gastroenterologist on call was consulted by the ER physician. As per Dr. Dulce Sellar is no definite indication to start heparin. We need to get the official CT abdomen results. As per patient's PCP is no signs of any acute pancreatitis in the CT. Patient otherwise denies anyhortness of breath or chest pain or any fever chills. Denies having used any recent antibiotics. Patient states she drinks alcohol only once a week.  Review of Systems: As presented in the history of presenting illness, rest negative.  Past Medical History  Diagnosis Date  . Hypertension   . Tobacco use disorder   . Seizures     withdraw from xanax  . Cancer     Right colon cancer T3  . Colon cancer   . Neuromuscular disorder    Past Surgical History  Procedure Laterality Date  . Colon surgery  2006  . Abdominal hysterectomy  1988    cancer  . Broken arm  2012    left  . Clavicle surgery  2012    left  . Tonsillectomy    . Tunneled venous port placement  2006  . Port-a-cath removal  10/31/2011    Procedure: REMOVAL PORT-A-CATH;  Surgeon: Atilano Ina, MD;  Location: [facility];  Service: General;  Laterality: N/A;  port-a-cath removal  . Esophagogastroduodenoscopy  02/10/2012    Procedure: ESOPHAGOGASTRODUODENOSCOPY (EGD);  Surgeon: Theda Belfast, MD;  Location: [facility];  Service: Endoscopy;  Laterality: N/A;   Social History:  reports that she has been smoking.  She does not have any smokeless tobacco history on file. She reports that she drinks about 2.4 ounces of alcohol per week. She reports that she does not use illicit drugs. Home. where does patient live-- Can do ADLs. Can patient participate in ADLs?  Allergies  Allergen Reactions  . Venlafaxine Hcl Other (See Comments)    Shortness of breath, irritable and mean    Family History  Problem Relation Age of Onset  . Cancer Father     prostate      Prior to Admission medications   Medication Sig Start Date End Date Taking? Authorizing Provider  Calcium Carbonate-Vitamin D (CALCIUM + D PO) Take 1 tablet by mouth every evening.   Yes Historical Provider, MD  clonazePAM (KLONOPIN) 2 MG tablet Take 2 mg by mouth 2 (two) times daily as needed.    Yes Historical Provider, MD  lisinopril (PRINIVIL,ZESTRIL) 40 MG tablet Take 40 mg by mouth daily.   Yes Historical Provider, MD  Multiple Vitamin (MULTIVITAMIN WITH MINERALS) TABS Take 1 tablet by mouth every evening.   Yes Historical Provider, MD  naproxen  sodium (ANAPROX) 220 MG tablet Take 440 mg by mouth daily as needed (for pain).   Yes Historical Provider, MD  pantoprazole (PROTONIX) 40 MG tablet Take 40 mg by mouth daily.   Yes Historical Provider, MD  lisinopril-hydrochlorothiazide (PRINZIDE,ZESTORETIC) 20-25 MG per tablet Take 1 tablet by mouth daily.    Historical Provider, MD   Physical Exam: Filed Vitals:   05/20/13 2000 05/20/13 2015 05/20/13 2030 05/20/13 2100  BP: 174/86 171/80 164/73 152/73  Pulse: 79 71 73 74  Temp:      TempSrc:      Resp: 13 9 19 17   SpO2: 94% 94% 93% 92%      General:  Well-developed well-nourished.  Eyes: Anicteric no pallor.  ENT: No discharge from the ears eyes nose or mouth.  Neck: No mass felt.  Cardiovascular: S1-S2 heard.  Respiratory: No rhonchi or crepitations.  Abdomen: Soft nontender bowel sounds present. No guarding rigidity.  Skin: No rash.  Musculoskeletal: No edema.  Psychiatric: Appears normal.  Neurologic: Alert awake oriented to time place and person. Moves all extremities.  Labs on Admission:  Basic Metabolic Panel:  Recent Labs Lab 05/20/13 1807  NA 141  K 3.0*  CL 102  CO2 29  GLUCOSE 102*  BUN 6  CREATININE 0.75  CALCIUM 9.5   Liver Function Tests:  Recent Labs Lab 05/20/13 1807  AST 56*  ALT 37*  ALKPHOS 226*  BILITOT 0.9  PROT 6.6  ALBUMIN 3.5    Recent Labs Lab 05/20/13 1807  LIPASE 191*   No results found for this basename: AMMONIA,  in the last 168 hours CBC:  Recent Labs Lab 05/20/13 1807  WBC 6.1  NEUTROABS 3.6  HGB 15.1*  HCT 43.7  MCV 99.5  PLT 186   Cardiac Enzymes: No results found for this basename: CKTOTAL, CKMB, CKMBINDEX, TROPONINI,  in the last 168 hours  BNP (last 3 results) No results found for this basename: PROBNP,  in the last 8760 hours CBG: No results found for this basename: GLUCAP,  in the last 168 hours  Radiological Exams on Admission: No results found.   Assessment/Plan Principal Problem:   Abdominal pain Active Problems:   Colon cancer   Alcohol abuse   Tobacco abuse   Pancreatitis   Mesenteric vein thrombosis   1. Abdominal pain - most likely secondary to pancreatitis and also patient has superior mesenteric vein nonocclusive thrombosis. Pancreatitis could possibly due to alcohol. Patient states she drinks only once a week now. At this time is not sure if patient's superior mesenteric vein thrombosis acute are not. We need to call patient's PCP in a.m. to get the official results. Patient's PCP can be reached at  [phone]. At this time I have also ordered lactic acid levels. I also discussed with on-call gastroenterologist Dr. Dulce Sellar. Per gastroenterologist there is no definite indication for starting heparin as we are not sure if patient's superior mesenteric vein thrombosis is acute are not. Patient does not look septic at this time. Check stool studies for C. difficile as patient also has diarrhea. Check sonogram of the abdomen due to elevated LFTs. 2. Hypertension - patient has been placed on when necessary IV hydralazine for systolic blood pressure more than 160. 3. Tobacco abuse - patient advised to quit smoking. 4. History of portal gastropathy and esophageal varices.     Co (de Status: Full code.  Family Communication: None.  Disposition Plan: Admit to inpatient.    Imran Nuon N. [facility] Pager [phone].  If 7PM-7AM, please contact night-coverage [email or web address] Password [facility] 05/20/2013, 10:26 PM

## 2013-05-20 NOTE — ED Provider Notes (Signed)
History     CSN: [id number]  Arrival date & time 05/20/13  1737   First MD Initiated Contact with Patient 05/20/13 1839      Chief Complaint  Patient presents with  . Abdominal Pain    (Consider location/radiation/quality/duration/timing/severity/associated sxs/prior treatment) HPI Comments: Pt with PMH significant for HTN, tobacco use disorder, seizures, neuromuscular d/o, presents to the ED for abdominal pain.  States pain has been present for the past several months increasing in severity over the past 2 weeks. Notes her abdomen has become increasingly distended and she has gained approximately 20 pounds in the past 2 weeks despite poor PO intake. Patient has a history of colon cancer, in remission for the past few years. This was treated with colon resection and chemotherapy. She was cleared from GI and is no longer having regular followups.  States BMs are irregular but non-bloody.  No recent fevers, sweats, or chills.  No urinary sx or vaginal d/c.  Denies any chest pain or SOB.  Occasional EtOH use but not in excess.  The history is provided by the patient.    Past Medical History  Diagnosis Date  . Hypertension   . Tobacco use disorder   . Seizures     withdraw from xanax  . Cancer     Right colon cancer T3  . Colon cancer   . Neuromuscular disorder     Past Surgical History  Procedure Laterality Date  . Colon surgery  2006  . Abdominal hysterectomy  1988    cancer  . Broken arm  2012    left  . Clavicle surgery  2012    left  . Tonsillectomy    . Tunneled venous port placement  2006  . Port-a-cath removal  10/31/2011    Procedure: REMOVAL PORT-A-CATH;  Surgeon: Atilano Ina, MD;  Location: [facility];  Service: General;  Laterality: N/A;  port-a-cath removal  . Esophagogastroduodenoscopy  02/10/2012    Procedure: ESOPHAGOGASTRODUODENOSCOPY (EGD);  Surgeon: Theda Belfast, MD;  Location: [facility];  Service: Endoscopy;  Laterality: N/A;     Family History  Problem Relation Age of Onset  . Cancer Father     prostate    History  Substance Use Topics  . Smoking status: Current Every Day Smoker -- 1.00 packs/day  . Smokeless tobacco: Not on file  . Alcohol Use: 2.4 oz/week    4 Shots of liquor per week    OB History   Grav Para Term Preterm Abortions TAB SAB Ect Mult Living                  Review of Systems  Gastrointestinal: Positive for abdominal pain and abdominal distention.  All other systems reviewed and are negative.    Allergies  Review of patient's allergies indicates no known allergies.  Home Medications   Current Outpatient Rx  Name  Route  Sig  Dispense  Refill  . clonazePAM (KLONOPIN) 2 MG tablet   Oral   Take 2 mg by mouth 2 (two) times daily.          Marland Kitchen lisinopril (PRINIVIL,ZESTRIL) 20 MG tablet   Oral   Take 20 mg by mouth daily.           Marland Kitchen EXPIRED: omeprazole (PRILOSEC) 20 MG capsule   Oral   Take 2 capsules (40 mg total) by mouth daily.   30 capsule   0   . PARoxetine (PAXIL) 20 MG tablet   Oral  Take 20 mg by mouth every morning.             BP 178/90  Pulse 79  Temp(Src) 98.4 F (36.9 C) (Oral)  Resp 18  SpO2 94%  Physical Exam  Nursing note and vitals reviewed. Constitutional: She is oriented to person, place, and time. She appears well-developed and well-nourished. No distress.  HENT:  Head: Normocephalic and atraumatic.  Mouth/Throat: Oropharynx is clear and moist.  Eyes: Conjunctivae and EOM are normal. Pupils are equal, round, and reactive to light.  Neck: Normal range of motion. Neck supple.  Cardiovascular: Normal rate, regular rhythm and normal heart sounds.   Pulmonary/Chest: Effort normal and breath sounds normal. No respiratory distress. She has no decreased breath sounds. She has no wheezes.  Abdominal: Soft. Bowel sounds are normal. There is tenderness in the epigastric area. There is no guarding, no CVA tenderness, no tenderness at  McBurney's point and negative Murphy's sign.  Musculoskeletal: Normal range of motion. She exhibits no edema.  Neurological: She is alert and oriented to person, place, and time.  Skin: Skin is warm and dry. She is not diaphoretic.  Psychiatric: She has a normal mood and affect.    ED Course  Procedures (including critical care time)   Date: 05/20/2013  Rate: 78  Rhythm: normal sinus rhythm  QRS Axis: normal  Intervals: normal  ST/T Wave abnormalities: nonspecific ST changes  Conduction Disutrbances:none  Narrative Interpretation: NSR, non-specific ST changes, no STEMI  Old EKG Reviewed: unchanged    Labs Reviewed  CBC WITH DIFFERENTIAL - Abnormal; Notable for the following:    Hemoglobin 15.1 (*)    MCH 34.4 (*)    RDW 16.8 (*)    Monocytes Relative 14 (*)    All other components within normal limits  COMPREHENSIVE METABOLIC PANEL - Abnormal; Notable for the following:    Potassium 3.0 (*)    Glucose, Bld 102 (*)    AST 56 (*)    ALT 37 (*)    Alkaline Phosphatase 226 (*)    All other components within normal limits  LIPASE, BLOOD - Abnormal; Notable for the following:    Lipase 191 (*)    All other components within normal limits  URINALYSIS, ROUTINE W REFLEX MICROSCOPIC - Abnormal; Notable for the following:    Color, Urine AMBER (*)    Hgb urine dipstick SMALL (*)    Bilirubin Urine SMALL (*)    All other components within normal limits  URINE MICROSCOPIC-ADD ON - Abnormal; Notable for the following:    Crystals CA OXALATE CRYSTALS (*)    All other components within normal limits  POCT I-STAT TROPONIN I   No results found.   1. Mesenteric vein thrombosis   2. Pancreatitis   3. Hypokalemia       MDM   57 y.o. F presenting to the ED for diffuse abdominal pain and bloating.  Notes 20lb weight gain in the past 2 weeks despite poor PO intake.  Hx of colon cancer with previous descending colectomy and chemo.  Labs in the ED as above- elevated liver  enzymes, LDH, and lipase- pancreatitis.  Pt does not appear septic at this time.  CT earlier today performed at [facility].  Could not obtain hard copy of results so they were verbally read back to me by Dr. Everlene Other- evidence of non-occluding superior mesenteric vein thrombosis and esophageal varices with normal pancreas, liver, GB, and kidneys.  GI consulted GI, Dr. Dulce Sellar, no herparin therapy  is immediately necessary for thrombosis as we do not know if this is an acute event. Consulted hospitalist, Dr. Toniann Fail, pt will be admitted to [facility] 10 telemetry.   Temp admit holding orders placed.  VS stable for transfer.       Garlon Hatchet, PA-C 05/21/13 1235

## 2013-05-21 ENCOUNTER — Inpatient Hospital Stay (HOSPITAL_COMMUNITY): Payer: Medicare Other

## 2013-05-21 DIAGNOSIS — F1010 Alcohol abuse, uncomplicated: Secondary | ICD-10-CM

## 2013-05-21 LAB — GLUCOSE, CAPILLARY
Glucose-Capillary: 83 mg/dL (ref 70–99)
Glucose-Capillary: 89 mg/dL (ref 70–99)
Glucose-Capillary: 92 mg/dL (ref 70–99)
Glucose-Capillary: 94 mg/dL (ref 70–99)

## 2013-05-21 LAB — COMPREHENSIVE METABOLIC PANEL
Alkaline Phosphatase: 186 U/L — ABNORMAL HIGH (ref 39–117)
BUN: 5 mg/dL — ABNORMAL LOW (ref 6–23)
Chloride: 109 mEq/L (ref 96–112)
Creatinine, Ser: 0.76 mg/dL (ref 0.50–1.10)
GFR calc Af Amer: >90 mL/min (ref >90)
GFR calc non Af Amer: >90 mL/min (ref >90)
Glucose, Bld: 89 mg/dL (ref 70–99)
Potassium: 3.1 mEq/L — ABNORMAL LOW (ref 3.5–5.1)
Total Bilirubin: 0.8 mg/dL (ref 0.3–1.2)

## 2013-05-21 LAB — CBC WITH DIFFERENTIAL/PLATELET
Basophils Absolute: 0.1 10*3/uL (ref 0.0–0.1)
Eosinophils Relative: 6 % — ABNORMAL HIGH (ref 0–5)
Lymphocytes Relative: 24 % (ref 12–46)
Neutro Abs: 3.2 10*3/uL (ref 1.7–7.7)
Neutrophils Relative %: 53 % (ref 43–77)
Platelets: 156 10*3/uL (ref 150–400)
RDW: 17.1 % — ABNORMAL HIGH (ref 11.5–15.5)
WBC: 6 10*3/uL (ref 4.0–10.5)

## 2013-05-21 MED ORDER — PAROXETINE HCL 20 MG PO TABS
40.0000 mg | ORAL_TABLET | Freq: Every day | ORAL | Status: DC
  Administered 2013-05-21 – 2013-05-25 (×5): 40 mg via ORAL
  Filled 2013-05-21 (×5): qty 2

## 2013-05-21 MED ORDER — POTASSIUM CHLORIDE CRYS ER 20 MEQ PO TBCR
40.0000 meq | EXTENDED_RELEASE_TABLET | Freq: Once | ORAL | Status: AC
  Administered 2013-05-21: 40 meq via ORAL
  Filled 2013-05-21: qty 2

## 2013-05-21 MED ORDER — HEPARIN BOLUS VIA INFUSION
2000.0000 [IU] | Freq: Once | INTRAVENOUS | Status: DC
  Filled 2013-05-21: qty 2000

## 2013-05-21 MED ORDER — HEPARIN (PORCINE) IN NACL 100-0.45 UNIT/ML-% IJ SOLN
1100.0000 [IU]/h | INTRAMUSCULAR | Status: DC
  Administered 2013-05-21: 700 [IU]/h via INTRAVENOUS
  Administered 2013-05-22: 1000 [IU]/h via INTRAVENOUS
  Administered 2013-05-23: 1050 [IU]/h via INTRAVENOUS
  Filled 2013-05-21 (×6): qty 250

## 2013-05-21 MED ORDER — PANTOPRAZOLE SODIUM 40 MG PO TBEC: 40.0000 mg | DELAYED_RELEASE_TABLET | Freq: Every day | ORAL | Status: DC

## 2013-05-21 MED ORDER — HEPARIN SODIUM (PORCINE) 5000 UNIT/ML IJ SOLN
5000.0000 [IU] | Freq: Three times a day (TID) | INTRAMUSCULAR | Status: DC
  Administered 2013-05-21: 5000 [IU] via SUBCUTANEOUS
  Filled 2013-05-21 (×3): qty 1

## 2013-05-21 MED ORDER — PANTOPRAZOLE SODIUM 40 MG PO TBEC
40.0000 mg | DELAYED_RELEASE_TABLET | Freq: Every day | ORAL | Status: DC
  Administered 2013-05-21 – 2013-05-24 (×4): 40 mg via ORAL
  Filled 2013-05-21 (×4): qty 1

## 2013-05-21 MED ORDER — LISINOPRIL 40 MG PO TABS
40.0000 mg | ORAL_TABLET | Freq: Every day | ORAL | Status: DC
  Administered 2013-05-21 – 2013-05-25 (×5): 40 mg via ORAL
  Filled 2013-05-21 (×6): qty 1

## 2013-05-21 NOTE — Progress Notes (Signed)
ANTICOAGULATION CONSULT NOTE  Pharmacy Consult for heparin Indication: mesenteric thrombosis  Allergies  Allergen Reactions  . Venlafaxine Hcl Other (See Comments)    Shortness of breath, irritable and mean    Patient Measurements: Height: 5\' 1"  (154.9 cm) Weight: 104 lb 11.5 oz (47.5 kg) IBW/kg (Calculated) : 47.8 Heparin Dosing Weight: 47.5 kg  Vital Signs: Temp: 97.9 F (36.6 C) (06/04 1319) Temp src: Oral (06/04 1319) BP: 175/80 mmHg (06/04 1319) Pulse Rate: 70 (06/04 1319)  Labs:  Recent Labs  05/20/13 1807 05/21/13 0600 05/21/13 2245  HGB 15.1* 13.8  --   HCT 43.7 41.9  --   PLT 186 156  --   HEPARINUNFRC  --   --  0.28*  CREATININE 0.75 0.76  --     Estimated Creatinine Clearance: 57.5 ml/min (by C-G formula based on Cr of 0.76).    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  Geannie Risen, PharmD, BCPS

## 2013-05-21 NOTE — Consult Note (Signed)
Subjective:   HPI  The patient is a 57 year old female who was admitted to the hospital because of diffuse abdominal pain. The patient states that she has been having diffuse abdominal pains for the past few weeks but it's been worse over the last few days. She went to see her primary care doctor who ordered a CT scan of the abdomen and pelvis which was done yesterday. This revealed the liver to have diffuse fatty infiltration in the gallbladder showed some mild pericolic cystic fluid. The spleen and pancreas were within normal limits. The adrenal glands were within normal limits. There were multiple distal esophageal varices consistent with portal hypertension. There was a non-obstructive thrombus identified in the left lateral wall of the superior mesenteric vein extending into the splenoportal confluence. There was no mention of any evidence of bowel infarction. The patient has also been experiencing diarrhea which is nonbloody in character.  The patient does have a history of colon cancer with a right hemicolectomy done several years ago. She had a followup colonoscopy done 2 or 3 years ago by Dr. Elnoria Howard which did not reveal any recurrence of malignancy or polyps according to her. An EGD done a couple of years ago showed some small esophageal varices and portal hypertensive gastropathy. The patient does admit to drinking some alcohol.  Yesterday her lipase was elevated but today it's normal. Lactic acid level was normal.  Review of Systems No chest pain or shortness of breath  Past Medical History  Diagnosis Date  . Hypertension   . Tobacco use disorder   . Seizures     withdraw from xanax  . Cancer     Right colon cancer T3  . Colon cancer   . Neuromuscular disorder    Past Surgical History  Procedure Laterality Date  . Colon surgery  2006  . Abdominal hysterectomy  1988    cancer  . Broken arm  2012    left  . Clavicle surgery  2012    left  . Tonsillectomy    . Tunneled venous  port placement  2006  . Port-a-cath removal  10/31/2011    Procedure: REMOVAL PORT-A-CATH;  Surgeon: Atilano Ina, MD;  Location: [facility];  Service: General;  Laterality: N/A;  port-a-cath removal  . Esophagogastroduodenoscopy  02/10/2012    Procedure: ESOPHAGOGASTRODUODENOSCOPY (EGD);  Surgeon: Theda Belfast, MD;  Location: [facility];  Service: Endoscopy;  Laterality: N/A;   History   Social History  . Marital Status: Married    Spouse Name: N/A    Number of Children: N/A  . Years of Education: N/A   Occupational History  . disability     Social History Main Topics  . Smoking status: Current Every Day Smoker -- 0.50 packs/day  . Smokeless tobacco: Not on file  . Alcohol Use: No     Comment: stated she quit  . Drug Use: No  . Sexually Active: Not on file   Other Topics Concern  . Not on file   Social History Narrative   Lives with husband and son          family history includes Cancer in her father. Current facility-administered medications:0.9 % NaCl with KCl 20 mEq/ L  infusion, , Intravenous, Continuous, Stephani Police, PA-C, Last Rate: 75 mL/hr at 05/21/13 1106;  acetaminophen (TYLENOL) suppository 650 mg, 650 mg, Rectal, Q6H PRN, Eduard Clos, MD;  acetaminophen (TYLENOL) tablet 650 mg, 650 mg, Oral, Q6H PRN, Eduard Clos,  MD antiseptic oral rinse (BIOTENE) solution 15 mL, 15 mL, Mouth Rinse, q12n4p, Eduard Clos, MD, 15 mL at 05/21/13 1244;  chlorhexidine (PERIDEX) 0.12 % solution 15 mL, 15 mL, Mouth Rinse, BID, Eduard Clos, MD, 15 mL at 05/21/13 0841;  clonazePAM (KLONOPIN) tablet 2 mg, 2 mg, Oral, BID PRN, Eduard Clos, MD;  heparin injection 5,000 Units, 5,000 Units, Subcutaneous, Q8H, Stephani Police, PA-C, 5,000 Units at 05/21/13 1457 hydrALAZINE (APRESOLINE) injection 10 mg, 10 mg, Intravenous, Q4H PRN, Eduard Clos, MD;  HYDROmorphone (DILAUDID) injection 0.5 mg, 0.5 mg, Intravenous, Q2H PRN, Leda Gauze, NP, 0.5 mg at 05/21/13 1456;  lisinopril (PRINIVIL,ZESTRIL) tablet 40 mg, 40 mg, Oral, Daily, Tora Kindred York, PA-C, 40 mg at 05/21/13 1244;  ondansetron (ZOFRAN) injection 4 mg, 4 mg, Intravenous, Q6H PRN, Eduard Clos, MD ondansetron (ZOFRAN) tablet 4 mg, 4 mg, Oral, Q6H PRN, Eduard Clos, MD;  pantoprazole (PROTONIX) EC tablet 40 mg, 40 mg, Oral, QHS, Shanker Levora Dredge, MD;  PARoxetine (PAXIL) tablet 40 mg, 40 mg, Oral, Daily, Tora Kindred York, PA-C, 40 mg at 05/21/13 1244;  sodium chloride 0.9 % injection 3 mL, 3 mL, Intravenous, Q12H, Eduard Clos, MD, 3 mL at 05/20/13 2317 Allergies  Allergen Reactions  . Venlafaxine Hcl Other (See Comments)    Shortness of breath, irritable and mean     Objective:     BP 175/80  Pulse 70  Temp(Src) 97.9 F (36.6 C) (Oral)  Resp 20  Ht 5\' 1"  (1.549 m)  Wt 47.5 kg (104 lb 11.5 oz)  BMI 19.8 kg/m2  SpO2 93%  She is in no acute distress  Nonicteric  Heart regular rhythm no murmurs  Lungs clear  Abdomen: Bowel sounds are present, soft, diffuse mild tenderness to palpation without rebound or guarding.  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 It could be acute or have been forming over the last few weeks, or it could be chronic. In light of the fact however that she has new onset of generalized abdominal pain I think we must assume that this thrombosis is new over the last few days or weeks and is forming. Superior mesenteric vein occlusion is a cause of intestinal ischemia. Fortunately there is no evidence based on recent CT scan of  intestinal infarction. Her pain however could be coming from intestinal ischemia. Standard initial treatment for acute mesenteric venous thrombosis would be to heparinized the patient, and I think that this would be a prudent thing to do at this time to prevent catastrophic intestinal infarction. There is no mention on the outside CT scan of the collaterall venous formation, which is most likely due to the fact that she is still getting blood flow since this clot is only partially occluding at this time. Certain risk factors for acute mesenteric venous thrombosis are portal hypertension which it does appear that she has, dehydration which he may have developed from her diarrhea, and possibly a hypercoagulable state.  It would probably be reasonable to work her up for any  hypercoagulable state. It would probably also be reasonable to ask vascular surgeries opinion in regards to this venous thrombosis.  Her stools need to be checked for C. Dif toxin.  Lab Results  Component Value Date   HGB 13.8 05/21/2013   HGB 15.1* 05/20/2013   HGB 10.6* 02/10/2012   HGB 16.3* 03/23/2010   HGB 14.7 03/11/2009   HGB 14.5 11/05/2006   HCT 41.9 05/21/2013   HCT 43.7 05/20/2013   HCT 30.9* 02/10/2012   HCT 46.9* 03/23/2010   HCT 42.4 03/11/2009   HCT 41.5 11/05/2006   ALKPHOS 186* 05/21/2013   ALKPHOS 226* 05/20/2013   ALKPHOS 128* 02/09/2012   AST 35 05/21/2013   AST 56* 05/20/2013   AST 20 02/09/2012   ALT 28 05/21/2013   ALT 37* 05/20/2013   ALT 29 02/09/2012

## 2013-05-21 NOTE — Care Management Note (Signed)
    Laura Farley 1 of 1   05/26/2013     11:40:30 AM   CARE MANAGEMENT NOTE 05/26/2013  Patient:  Laura Farley, Laura Farley   Account Number:  [id number]  Date Initiated:  05/21/2013  Documentation initiated by:  Letha Cape  Subjective/Objective Assessment:   dx abd pain, mesenteric thrombosis  admit- lives with spouse and son.  pta indep.     Action/Plan:   Anticipated DC Date:  05/25/2013   Anticipated DC Plan:  HOME/SELF CARE      DC Planning Services  CM consult      Choice offered to / List presented to:             Status of service:  Completed, signed off Medicare Important Message given?   (If response is "NO", the following Medicare IM given date fields will be blank) Date Medicare IM given:   Date Additional Medicare IM given:    Discharge Disposition:  HOME/SELF CARE  Per UR Regulation:  Reviewed for med. necessity/level of care/duration of stay  If discussed at Long Length of Stay Meetings, dates discussed:    Comments:  05/24/2013 1645 NCM contacted [facility] on [address] and they do not have Lovenox in stock . Contacted pt and she can use [facility] on [address]. Contacted [facility] and they do have in stock. Faxed Lovenox Rx with facesheet to check for copay price. Will fax additional Rx's in am to [facility]. Isidoro Donning RN CCM Case Mgmt phone [phone]  05/21/13 16:05 Letha Cape RN, BSN [phone] patient lives with spouse and son, pta indep. Patient with mesnteric thrombosis and abd pain, started on heparin gtt, NCM will continue to follow for dc needs.

## 2013-05-21 NOTE — Progress Notes (Signed)
Patient admitted to 5525 from ED. Patient lives at home with husband and son.  Patient is A&Ox3. Patient's skin is warm, dry and intact. Patient placed on telemetry and continuous pulse ox.  Patient refused to watch safety video. Oriented patient to unit and room. Patient placed on contact precautions to r/o c-diff.  Placed patient on bedalarm and instructed patient to call for assistance, patient stated understanding.  Will continue to monitor patient. Jerrye Bushy

## 2013-05-21 NOTE — Progress Notes (Signed)
[facility] PROGRESS NOTE  Laura Farley GEX:[id number] DOB: 17-Mar-1956 DOA: 05/20/2013 PCP: Pcp Not In System  Assessment/Plan:  Abdominal pain with diarrhea  Elevated Lipase (190s) and elevated Alkphos (220s)-pain better but not resolved yet. Etiology not very evident at this time.  However CT Abdomen done in [facility] does not show any pancreatic abnormalities  Hx of esophageal varices/ Portal hypertension  (Previous patient of Dr. Elnoria Howard)  CT Scan done outpatient on 6/3 shows: pericholecystic fluid, underlying ascites, non-obstructing thrombus in superior mesenteric vein and extending into the splenoportal confluence.  Heparin drip not thought to be necessary at this time-after consulting Dr Dulce Sellar by admitting MD  UlS abdomen ordered. - eval for cholecystitis and cirrhosis  C-diff PCR pending  [facility] consulted-awaiting formal recommendations  PRN dilaudid while NPO  HTN On PRN hydralazine Will restart home dose of lisinopril and follow BP trend  Tobacco Abuse Counseled  Hx of portal gastropathy and esophageal varices Small varices noted on EGD by [facility] in 01/2012 Stable.  Not on BB.  Continue protonix. Daily weights, Low sodium diet when she is no longer NPO.  DVT Prophylaxis:  heparin  Code Status: full Family Communication: Disposition:  Inpatient.  Consultants:  [facility]  Procedures:    Antibiotics:  None   HPI/Subjective: Patient complains of yellow watery diarrhea 2x overnight.  Still with abdominal pain.  She reports that she drinks margaritas on Friday evenings with her husband - and that she feels like she's gained 20 lbs of fluid in her abdomen in the last two weeks.  Objective: Filed Vitals:   05/20/13 2100 05/20/13 2226 05/20/13 2235 05/21/13 0648  BP: 152/73  159/91 127/76  Pulse: 74  78 69  Temp:   97.5 F (36.4 C) 97.8 F (36.6 C)  TempSrc:   Oral Oral  Resp: 17  18 20   Height:   5\' 1"  (1.549 m)   Weight:   47.6 kg (104 lb  15 oz) 47.5 kg (104 lb 11.5 oz)  SpO2: 92% 97% 92% 93%    Intake/Output Summary (Last 24 hours) at 05/21/13 1056 Last data filed at 05/21/13 0900  Gross per 24 hour  Intake 906.25 ml  Output      0 ml  Net 906.25 ml   Filed Weights   05/20/13 2235 05/21/13 0648  Weight: 47.6 kg (104 lb 15 oz) 47.5 kg (104 lb 11.5 oz)    Exam:   General:  A&O, NAD, Lying comfortably in bed, slightly pale  Cardiovascular: RRR, no murmurs, rubs or gallops, no lower extremity edema  Respiratory: CTA, no wheeze, crackles, or rales.  No increased work of breathing.  Abdomen: Soft, slightly-tender/ diffuse, non-distended, + bowel sounds, Liver edge noted 2 cm below lowest rib  Musculoskeletal: Able to move all 4 extremities, 5/5 strength in each  Data Reviewed: Basic Metabolic Panel:  Recent Labs Lab 05/20/13 1807 05/21/13 0600  NA 141 143  K 3.0* 3.1*  CL 102 109  CO2 29 26  GLUCOSE 102* 89  BUN 6 5*  CREATININE 0.75 0.76  CALCIUM 9.5 8.1*   Liver Function Tests:  Recent Labs Lab 05/20/13 1807 05/21/13 0600  AST 56* 35  ALT 37* 28  ALKPHOS 226* 186*  BILITOT 0.9 0.8  PROT 6.6 5.6*  ALBUMIN 3.5 3.0*    Recent Labs Lab 05/20/13 1807 05/21/13 0600  LIPASE 191* 42   CBC:  Recent Labs Lab 05/20/13 1807 05/21/13 0600  WBC 6.1 6.0  NEUTROABS 3.6  3.2  HGB 15.1* 13.8  HCT 43.7 41.9  MCV 99.5 102.2*  PLT 186 156   CBG:  Recent Labs Lab 05/21/13 0007 05/21/13 0359 05/21/13 0758  GLUCAP 89 90 95      Studies: US Abdomen Complete  05/21/2013   *RADIOLOGY REPORT*  Clinical Data:  Abnormal liver function tests, history of carcinoma of the colon completed chemotherapy in 2007.  Also, history of uterine carcinoma many years ago.  COMPLETE ABDOMINAL ULTRASOUND  Comparison:  Ultrasound of the abdomen of 06/12/2011 and CT abdomen and pelvis of 03/29/2010  Findings:  Gallbladder:  The gallbladder wall is noted to be thickened measuring up to 8.1 mm diffusely.  This can  be seen with acute cholecystitis, but no gallstones are seen and there is no pain over the gallbladder.  No definite pericholecystic fluid is seen.  Other considerations are that of hyperproteinemia, congestive heart failure, and cirrhosis.  Common bile duct:  The common bile duct is normal measuring 4.0 mm in diameter.  Liver:  The liver is somewhat inhomogeneous which suggests fatty infiltration.  No focal hepatic lesion is seen and no ductal dilatation is noted.  There is a small amount of perihepatic fluid present.  IVC:  Appears normal.  Pancreas:  The pancreas is obscured by bowel gas and cannot be evaluated.  There needed  Spleen:  The spleen is enlarged measuring 12.5 cm with a volume of 414 ml.  Right Kidney:  No hydronephrosis is seen.  The right kidney measures 9.4 cm sagittally.  Left Kidney:  No hydronephrosis is noted.  The contours of the left kidney are slightly lobular, with left kidney measuring 8.9 cm.  Abdominal aorta:  The abdominal aorta appears normal in caliber proximally, but is obscured distally by bowel gas.  IMPRESSION:  1.  Inhomogeneous liver suggestive of fatty infiltration.  There is a small amount of perihepatic fluid present.  Question ascites. 2.  Diffusely thickened gallbladder wall as noted above.  No gallstones or pain over the gallbladder currently. 3.  Splenomegaly. 4.  Pancreas and portions of the abdominal aorta are obscured by bowel gas.   Original Report Authenticated By: Dwyane Dee, M.D.    Scheduled Meds: . antiseptic oral rinse  15 mL Mouth Rinse q12n4p  . chlorhexidine  15 mL Mouth Rinse BID  . heparin subcutaneous  5,000 Units Subcutaneous Q8H  . pantoprazole (PROTONIX) IV  40 mg Intravenous Q24H  . potassium chloride  40 mEq Oral Once  . sodium chloride  3 mL Intravenous Q12H   Continuous Infusions: . 0.9 % NaCl with KCl 20 mEq / L 1,000 mL (05/21/13 1610)    Principal Problem:   Abdominal pain Active Problems:   Colon cancer   Alcohol abuse    Tobacco abuse   Pancreatitis   Mesenteric vein thrombosis    Conley Canal / APP  [facility] Pager [phone]. If 7PM-7AM, please contact night-coverage at [email or web address], password [facility] 05/21/2013, 10:56 AM  LOS: 1 day   Attending Patient seen and examined, agree with the assessment and plan as outlined above. She was admitted with abdominal pain and diarrhea that has been going for 3 weeks, she had a outpatient CT Abd done at [facility] that showed sup mesenteric vein partial occlusion and was sent to the ED for further evaluation by her PCP. Labs did show mild lipase elevation, however CT Abdomen (hard copy in paper chart)-offical report did not show any pancreatic abnormalities. I am not sure  what exactly is causing her abd pain-will ask GI for assistance. Start clear liquids   SGhimire MD

## 2013-05-21 NOTE — Progress Notes (Signed)
UR COMPLETED  

## 2013-05-21 NOTE — Progress Notes (Addendum)
ANTICOAGULATION CONSULT NOTE - Initial Consult  Pharmacy Consult for heparin Indication: mesenteric thrombosis  Allergies  Allergen Reactions  . Venlafaxine Hcl Other (See Comments)    Shortness of breath, irritable and mean    Patient Measurements: Height: 5\' 1"  (154.9 cm) Weight: 104 lb 11.5 oz (47.5 kg) IBW/kg (Calculated) : 47.8 Heparin Dosing Weight: 47.5 kg  Vital Signs: Temp: 97.9 F (36.6 C) (06/04 1319) Temp src: Oral (06/04 1319) BP: 175/80 mmHg (06/04 1319) Pulse Rate: 70 (06/04 1319)  Labs:  Recent Labs  05/20/13 1807 05/21/13 0600  HGB 15.1* 13.8  HCT 43.7 41.9  PLT 186 156  CREATININE 0.75 0.76    Estimated Creatinine Clearance: 57.5 ml/min (by C-G formula based on Cr of 0.76).   Medical History: Past Medical History  Diagnosis Date  . Hypertension   . Tobacco use disorder   . Seizures     withdraw from xanax  . Cancer     Right colon cancer T3  . Colon cancer   . Neuromuscular disorder     Medications:  Prescriptions prior to admission  Medication Sig Dispense Refill  . Calcium Carbonate-Vitamin D (CALCIUM + D PO) Take 1 tablet by mouth every evening.      . clonazePAM (KLONOPIN) 2 MG tablet Take 2 mg by mouth 2 (two) times daily as needed.       Marland Kitchen lisinopril (PRINIVIL,ZESTRIL) 40 MG tablet Take 40 mg by mouth daily.      . Multiple Vitamin (MULTIVITAMIN WITH MINERALS) TABS Take 1 tablet by mouth every evening.      . naproxen sodium (ANAPROX) 220 MG tablet Take 440 mg by mouth daily as needed (for pain).      . pantoprazole (PROTONIX) 40 MG tablet Take 40 mg by mouth daily.      Marland Kitchen PARoxetine (PAXIL) 40 MG tablet Take 40 mg by mouth every morning.      Marland Kitchen lisinopril-hydrochlorothiazide (PRINZIDE,ZESTORETIC) 20-25 MG per tablet Take 1 tablet by mouth daily.        Assessment: 57 yo Laura Farley admitted with adbominal pain to start heparin for mesenteric thrombosis.  Baseline Hg and PTLC wnl.  She was on no anticoagulation PTA but was on sq  heparin for VTE px. Goal of Therapy:  Heparin level 0.3-0.7 units/ml Monitor platelets by anticoagulation protocol: Yes   Plan:  Heparin bolus 2000 units and drip at 700 units/hr Check heparin level 6 hours after bolus and daily while on heparin. Monitor for s&s bleeding.  Talbert Cage Poteet 6/Laura/2014,3:37 PM  Addendum:  Spoke with nurse, she just gave the patient a SQ dose of Heparin at 14:57.  Will discontinue the IV bolus and just start her Heparin infusion as previously ordered.  Laura Laura Farley, PharmD., MS Clinical Pharmacist Pager:  [phone]

## 2013-05-22 ENCOUNTER — Encounter (HOSPITAL_COMMUNITY): Payer: Self-pay | Admitting: Radiology

## 2013-05-22 ENCOUNTER — Inpatient Hospital Stay (HOSPITAL_COMMUNITY): Payer: Medicare Other

## 2013-05-22 DIAGNOSIS — K55059 Acute (reversible) ischemia of intestine, part and extent unspecified: Secondary | ICD-10-CM

## 2013-05-22 DIAGNOSIS — K319 Disease of stomach and duodenum, unspecified: Secondary | ICD-10-CM

## 2013-05-22 DIAGNOSIS — R109 Unspecified abdominal pain: Secondary | ICD-10-CM

## 2013-05-22 DIAGNOSIS — K551 Chronic vascular disorders of intestine: Secondary | ICD-10-CM

## 2013-05-22 DIAGNOSIS — I10 Essential (primary) hypertension: Secondary | ICD-10-CM

## 2013-05-22 LAB — CBC
HCT: 44.5 % (ref 36.0–46.0)
Hemoglobin: 13.7 g/dL (ref 12.0–15.0)
MCH: 34.6 pg — ABNORMAL HIGH (ref 26.0–34.0)
MCHC: 32.7 g/dL (ref 30.0–36.0)
MCHC: 33.9 g/dL (ref 30.0–36.0)
MCV: 102.1 fL — ABNORMAL HIGH (ref 78.0–100.0)
Platelets: 139 10*3/uL — ABNORMAL LOW (ref 150–400)
RBC: 4.02 MIL/uL (ref 3.87–5.11)
RDW: 16.5 % — ABNORMAL HIGH (ref 11.5–15.5)

## 2013-05-22 LAB — GLUCOSE, CAPILLARY
Glucose-Capillary: 82 mg/dL (ref 70–99)
Glucose-Capillary: 88 mg/dL (ref 70–99)
Glucose-Capillary: 91 mg/dL (ref 70–99)

## 2013-05-22 LAB — PROTIME-INR
INR: 1.16 (ref 0.00–1.49)
Prothrombin Time: 14.6 seconds (ref 11.6–15.2)

## 2013-05-22 LAB — COMPREHENSIVE METABOLIC PANEL
Albumin: 3.1 g/dL — ABNORMAL LOW (ref 3.5–5.2)
BUN: 3 mg/dL — ABNORMAL LOW (ref 6–23)
Creatinine, Ser: 0.72 mg/dL (ref 0.50–1.10)
Total Bilirubin: 0.9 mg/dL (ref 0.3–1.2)
Total Protein: 5.9 g/dL — ABNORMAL LOW (ref 6.0–8.3)

## 2013-05-22 LAB — HEPARIN LEVEL (UNFRACTIONATED)
Heparin Unfractionated: 0.19 IU/mL — ABNORMAL LOW (ref 0.30–0.70)
Heparin Unfractionated: 0.41 IU/mL (ref 0.30–0.70)

## 2013-05-22 MED ORDER — HYDROMORPHONE HCL PF 1 MG/ML IJ SOLN
1.0000 mg | INTRAMUSCULAR | Status: DC | PRN
  Administered 2013-05-22 – 2013-05-23 (×5): 1 mg via INTRAVENOUS
  Filled 2013-05-22 (×5): qty 1

## 2013-05-22 MED ORDER — HEPARIN BOLUS VIA INFUSION
2000.0000 [IU] | INTRAVENOUS | Status: AC
  Administered 2013-05-22: 2000 [IU] via INTRAVENOUS
  Filled 2013-05-22: qty 2000

## 2013-05-22 MED ORDER — AMLODIPINE BESYLATE 10 MG PO TABS
10.0000 mg | ORAL_TABLET | Freq: Every day | ORAL | Status: DC
  Administered 2013-05-22 – 2013-05-25 (×4): 10 mg via ORAL
  Filled 2013-05-22 (×4): qty 1

## 2013-05-22 MED ORDER — IOHEXOL 300 MG/ML  SOLN
100.0000 mL | Freq: Once | INTRAMUSCULAR | Status: AC | PRN
  Administered 2013-05-22: 100 mL via INTRAVENOUS

## 2013-05-22 MED ORDER — SODIUM CHLORIDE 0.9 % IV SOLN
INTRAVENOUS | Status: DC
  Administered 2013-05-22: 19:00:00 via INTRAVENOUS

## 2013-05-22 NOTE — Progress Notes (Signed)
ANTICOAGULATION CONSULT NOTE  Pharmacy Consult for heparin Indication: mesenteric thrombosis  Allergies  Allergen Reactions  . Venlafaxine Hcl Other (See Comments)    Shortness of breath, irritable and mean    Patient Measurements: Height: 5\' 1"  (154.9 cm) Weight: 130 lb 11.7 oz (59.3 kg) IBW/kg (Calculated) : 47.8 Heparin Dosing Weight: 47.5 kg  Vital Signs: Temp: 97.9 F (36.6 C) (06/05 0615) Temp src: Oral (06/05 0615) BP: 170/92 mmHg (06/05 1041) Pulse Rate: 73 (06/05 0615)  Labs:  Recent Labs  05/20/13 1807 05/21/13 0600 05/21/13 2245 05/22/13 0610 05/22/13 0948  HGB 15.1* 13.8  --  13.7  --   HCT 43.7 41.9  --  41.9  --   PLT 186 156  --  139*  --   LABPROT  --   --   --  14.6  --   INR  --   --   --  1.16  --   HEPARINUNFRC  --   --  0.28*  --  0.19*  CREATININE 0.75 0.76  --  0.72  --     Estimated Creatinine Clearance: 63.4 ml/min (by C-G formula based on Cr of 0.72).    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 Anticoagulation with IV heparin infusion.  Today's AM heparin level is 0.19 on 800 units/hr IV heparin. Level is subtherapeutic and has decreased despite an increase in the heparin drip rate last night.  The patient is still complaining of a diffuse abdominal pain. PLTC decreased to 139k, Hgb 13.7 from 15.1.  No bleeding noted.   Goal of Therapy:  Heparin level 0.3-0.7 units/ml Monitor platelets by anticoagulation protocol: Yes   Plan:  Give heparin bolus 2000 units IV x1 and increase heparin drip rate to 1000 units/hr Check heparin level in 6 hours.  Noah Delaine, RPh Clinical Pharmacist Pager: [phone]

## 2013-05-22 NOTE — Consult Note (Signed)
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 female who presented to the hospital 2 days ago with abdominal pain.  She states that she has had the pain over a couple of months, but it has worsened over the past couple of weeks.  She states that 2-3 weeks ago, she weighed 104 lbs and now weighs 128 lbs.  She states that she has increased pain when she eats, but nothing really makes it better.  She states sometimes ginger ale will sometimes help.    She says that she has had diarrhea over the past 3 weeks, but this has not had any blood in it.  She says her past 2 BM's have not been diarrhea, but have not been totally solid either and they are also without blood.  She does have a history of colon cancer for which she underwent a right colectomy and took chemotherapy for 5 years ago and has been in remission since then.  She states that she has some joint pains in her elbows and numbness in her feet due to the chemo.  She also has a hx of uterine cancer for which she had a hysterectomy in 1988.    She states she had a mammogram on Monday, but does not know the results of this.  She says she does have some chest soreness from this.  She says she has had some CP over the past week that last less than a minute.  She also has DOE.  She does have HTN and is treated with antihypertensives and also anxiety for which she takes klonopin.  Pt had a CT scan of the abdomen and pelvis, which revealed the liver to have diffuse fatty infiltration and the gallbladder showed some mild pericolic cystic fluid.  The spleen and pancrease and adrenal glands were WNL.  There were distal esophageal varices consistent with portal HTN.  There was a non-obstructive thrombus identified in the left lateral wall of the SMV extending into the splenoportal confluence and no mention of any  bowel infarction.  Pt was started on a heparin gtt yesterday.  Her Lipase, AST, and ALT were all elevated on admission and now WNL.  Her alkaline phosphatase continues to be elevated.  She does smoke cigarettes about .5 ppd and has for 20 years.  VVS is consulted.   Past Medical History  Diagnosis Date  . Hypertension   . Tobacco use disorder   . Seizures     withdraw from xanax  . Cancer     Right colon cancer T3  . Colon cancer   . Neuromuscular disorder    Past Surgical History  Procedure Laterality Date  . Colon surgery  2006  . Abdominal hysterectomy  1988    cancer  . Broken arm  2012    left  . Clavicle surgery  2012    left  . Tonsillectomy    . Tunneled venous port placement  2006  . Port-a-cath removal  10/31/2011    Procedure: REMOVAL PORT-A-CATH;  Surgeon: Atilano Ina, MD;  Location: [facility];  Service: General;  Laterality: N/A;  port-a-cath removal  . Esophagogastroduodenoscopy  02/10/2012    Procedure: ESOPHAGOGASTRODUODENOSCOPY (EGD);  Surgeon: Theda Belfast, MD;  Location: [facility] ENDOSCOPY;  Service: Endoscopy;  Laterality: N/A;    Allergies  Allergen Reactions  . Venlafaxine Hcl Other (  See Comments)    Shortness of breath, irritable and mean    Prior to Admission medications   Medication Sig Start Date End Date Taking? Authorizing Provider  Calcium Carbonate-Vitamin D (CALCIUM + D PO) Take 1 tablet by mouth every evening.   Yes Historical Provider, MD  clonazePAM (KLONOPIN) 2 MG tablet Take 2 mg by mouth 2 (two) times daily as needed.    Yes Historical Provider, MD  lisinopril (PRINIVIL,ZESTRIL) 40 MG tablet Take 40 mg by mouth daily.   Yes Historical Provider, MD  Multiple Vitamin (MULTIVITAMIN WITH MINERALS) TABS Take 1 tablet by mouth every evening.   Yes Historical Provider, MD  naproxen sodium (ANAPROX) 220 MG tablet Take 440 mg by mouth daily as needed (for pain).   Yes Historical Provider, MD  pantoprazole (PROTONIX) 40 MG  tablet Take 40 mg by mouth daily.   Yes Historical Provider, MD  PARoxetine (PAXIL) 40 MG tablet Take 40 mg by mouth every morning.   Yes Historical Provider, MD  lisinopril-hydrochlorothiazide (PRINZIDE,ZESTORETIC) 20-25 MG per tablet Take 1 tablet by mouth daily.    Historical Provider, MD    History   Social History  . Marital Status: Married    Spouse Name: N/A    Number of Children: N/A  . Years of Education: N/A   Occupational History  . disability     Social History Main Topics  . Smoking status: Current Every Day Smoker -- 0.50 packs/day  . Smokeless tobacco: Not on file  . Alcohol Use: No     Comment: stated she quit  . Drug Use: No  . Sexually Active: Not on file   Other Topics Concern  . Not on file   Social History Narrative   Lives with husband and son           Family History  Problem Relation Age of Onset  . Cancer Father     prostate    ROS: [x]  Positive   [ ]  Negative   [ ]  All sytems reviewed and are negative  General: [ x] Weight gain 25 lbs over 3 weeks, [ ]  Fever, [ ]  chills Neurologic: [ ]  Dizziness, [ ]  Blackouts, Arly.Keller ] Seizure-a total of 3 with the last one being about a year ago. [ ]  Stroke, [ ]  "Mini stroke", [ ]  Slurred speech, [ ]  Temporary blindness; [ ]  weakness in arms or legs, [ ]  Hoarseness Cardiac: [ x] Chest pain/pressure last about a minte, [ ]  Shortness of breath at rest [x]  Shortness of breath with exertion, [ ]  Atrial fibrillation or irregular heartbeat Vascular: [ x] Pain in legs with walking, [ ]  Pain in legs at rest, [ ]  Pain in legs at night, [x]  cramping in legs and feet while walking-walks about 1/2 mile before having to stop  [ ]  Non-healing ulcer, [ x] Blood clot in vein SMV Pulmonary: [ ]  Home oxygen, [ ]  Productive cough,  [ ]  Wheezing Musculoskeletal:  [ ]  Arthritis, [ ]  Low back pain, [x ] Joint pain in  Elbows since chemo Hematologic: [ ]  Easy Bruising, [ ]  Anemia; [ ]  Hepatitis Gastrointestinal: [ ]  Blood in stool,  [ ]  Gastroesophageal Reflux/heartburn, [ ]  Trouble swallowing; [x]  hx of esophageal varices Urinary: [ ]  chronic Kidney disease, [ ]  on HD - [ ]  MWF or [ ]  TTHS, [ ]  Burning with urination, [ ]  Difficulty urinating Endocrine: [ ]  hx of diabetes, [ ]  hx of thyroid disease Skin: [ ]  Rashes, [ ]   Wounds Psychological: [ x] Anxiety, [ ]  Depression   Physical Examination  Filed Vitals:   05/22/13 0615  BP: 168/86  Pulse: 73  Temp: 97.9 F (36.6 C)  Resp: 20   Body mass index is 24.71 kg/(m^2).  General:  WDWN in NAD Gait: Not observed HENT: WNL, normocephalic Eyes: Pupils equal Pulmonary: normal non-labored breathing , without Rales, rhonchi,  wheezing Cardiac: RRR, without  Murmurs, rubs or gallops; without carotid bruits Abdomen: soft, distended, tender to palpation; no masses Skin: without rashes, without ulcers  Vascular Exam/Pulses: - for carotid bruits; bilateral palpable radial pulses; bilateral palpable DP pulses; left femoral pulse is palpable; cannot palpate right femoral pulse Extremities: without ischemic changes, without Gangrene , without cellulitis; without open wounds;  Musculoskeletal: no muscle wasting or atrophy  Neurologic: A&O X 3; Appropriate Affect ; SENSATION: normal; MOTOR FUNCTION:  moving all extremities equally. Speech is fluent/normal   CBC    Component Value Date/Time   WBC 5.5 05/22/2013 0610   WBC 9.5 03/23/2010 1244   RBC 4.02 05/22/2013 0610   RBC 4.64 03/23/2010 1244   HGB 13.7 05/22/2013 0610   HGB 16.3* 03/23/2010 1244   HCT 41.9 05/22/2013 0610   HCT 46.9* 03/23/2010 1244   PLT 139* 05/22/2013 0610   PLT 245 03/23/2010 1244   MCV 104.2* 05/22/2013 0610   MCV 101.0 03/23/2010 1244   MCH 34.1* 05/22/2013 0610   MCH 35.1* 03/23/2010 1244   MCHC 32.7 05/22/2013 0610   MCHC 34.7 03/23/2010 1244   RDW 16.9* 05/22/2013 0610   RDW 15.5* 03/23/2010 1244   LYMPHSABS 1.4 05/21/2013 0600   LYMPHSABS 2.9 03/23/2010 1244   MONOABS 1.0 05/21/2013 0600   MONOABS 0.7 03/23/2010 1244    EOSABS 0.4 05/21/2013 0600   EOSABS 0.3 03/23/2010 1244   BASOSABS 0.1 05/21/2013 0600   BASOSABS 0.1 03/23/2010 1244    BMET    Component Value Date/Time   NA 142 05/22/2013 0610   K 4.5 05/22/2013 0610   CL 109 05/22/2013 0610   CO2 27 05/22/2013 0610   GLUCOSE 78 05/22/2013 0610   BUN 3* 05/22/2013 0610   CREATININE 0.72 05/22/2013 0610   CALCIUM 8.6 05/22/2013 0610   GFRNONAA >90 05/22/2013 0610   GFRAA >90 05/22/2013 0610     Non-Invasive Vascular Imaging:  CT results obtained from Dr. Luan Moore note.   ASSESSMENT/PLAN: This is a 57 y.o. female with non obstructing thrombus in the SMV and extending into the splenoportal confluence without mention of bowel infarction with abdominal pain for 2-3 weeks.  -asked pt to not eat anything until she is seen by Dr. Myra Gianotti -heparin gtt started yesterday -further assessment and plan per Dr. Vista Lawman, PA-C [facility] [phone]

## 2013-05-22 NOTE — Progress Notes (Signed)
[facility] PROGRESS NOTE  Laura Farley ZOX:[id number] DOB: 1956-11-06 DOA: 05/20/2013 PCP: Pcp Not In System  Assessment/Plan:  Non-Occlusive Superior Mesenteric Vein Thrombosis  Heparin drip per pharmacy  Appreciate GI and Vascular Consultations  Repeat Abdominal CT pending.  Will consider IR / CCS consults pending CT results.  Abdominal pain with diarrhea  Uncertain etiology - SMV thrombosis vs Cholecystitis vs Cirrhosis  Patient complaining of abdominal swelling and sudden weight gain.  Elevated Lipase (190 -> 42) now normalized.  CT Abdomen done in [facility] does not show any pancreatic abnormalities  elevated Alkphos (220s) improving.  Hx of esophageal varices/ Portal hypertension  (Previous patient of Dr. Elnoria Howard)  CT Scan done outpatient on 6/3 shows: pericholecystic fluid, underlying ascites, non-obstructing thrombus in superior mesenteric vein and extending into the splenoportal confluence.   UlS abdomen shows thickened gall bladder wall (8 mm).  ?acute cholecystitis? Given elevated Alk Phos.  C-diff negative.  HTN  On PRN hydralazine  Home dose lisinopril restarted 6/4.  BP still elevated.  Add Norvasc 10 mg daily.  Tobacco Abuse  Counseled  Hx of portal gastropathy and esophageal varices  Small varices noted on EGD by [facility] in 01/2012  Stable.  Not on BB.  Continue protonix.  Daily weights, Low sodium diet when she is no longer NPO.  Macrocytic Anemia with mild thrombocytopenia  Likely secondary to alcohol use  Check b12  DVT Prophylaxis:  heparin  Code Status: full Family Communication: Disposition:  Inpatient.  Consultants:  [facility]  Procedures:    Antibiotics:  None   HPI/Subjective: Patient reports 1 bout of yellow watery diarrhea and 1 formed stool overnight.  Complains that her abdomen in more swollen and painful today (despite dilaudid)  Objective: Filed Vitals:   05/21/13 2300 05/22/13 0615 05/22/13 0625  05/22/13 1041  BP: 162/93 168/86  170/92  Pulse: 77 73    Temp: 98.5 F (36.9 C) 97.9 F (36.6 C)    TempSrc: Oral Oral    Resp: 18 20    Height:      Weight:   59.3 kg (130 lb 11.7 oz)   SpO2: 95% 94%      Intake/Output Summary (Last 24 hours) at 05/22/13 1222 Last data filed at 05/22/13 0900  Gross per 24 hour  Intake 1709.25 ml  Output      0 ml  Net 1709.25 ml   Filed Weights   05/20/13 2235 05/21/13 0648 05/22/13 0625  Weight: 47.6 kg (104 lb 15 oz) 47.5 kg (104 lb 11.5 oz) 59.3 kg (130 lb 11.7 oz)    Exam:   General:  A&O, NAD, Lying comfortably in bed, appears ill  Cardiovascular: RRR, no murmurs, rubs or gallops, no lower extremity edema  Respiratory: CTA, no wheeze, crackles, or rales.  No increased work of breathing.  Abdomen: Soft, slightly-tender/ diffuse, non-distended, + bowel sounds, Liver edge noted 2 cm below lowest rib  Musculoskeletal: Able to move all 4 extremities, 5/5 strength in each  Data Reviewed: Basic Metabolic Panel:  Recent Labs Lab 05/20/13 1807 05/21/13 0600 05/22/13 0610  NA 141 143 142  K 3.0* 3.1* 4.5  CL 102 109 109  CO2 29 26 27   GLUCOSE 102* 89 78  BUN 6 5* 3*  CREATININE 0.75 0.76 0.72  CALCIUM 9.5 8.1* 8.6   Liver Function Tests:  Recent Labs Lab 05/20/13 1807 05/21/13 0600 05/22/13 0610  AST 56* 35 40*  ALT 37* 28 28  ALKPHOS 226* 186* 165*  BILITOT 0.9 0.8 0.9  PROT 6.6 5.6* 5.9*  ALBUMIN 3.5 3.0* 3.1*    Recent Labs Lab 05/20/13 1807 05/21/13 0600  LIPASE 191* 42   CBC:  Recent Labs Lab 05/20/13 1807 05/21/13 0600 05/22/13 0610  WBC 6.1 6.0 5.5  NEUTROABS 3.6 3.2  --   HGB 15.1* 13.8 13.7  HCT 43.7 41.9 41.9  MCV 99.5 102.2* 104.2*  PLT 186 156 139*   CBG:  Recent Labs Lab 05/21/13 2021 05/21/13 2356 05/22/13 0448 05/22/13 0752 05/22/13 1204  GLUCAP 92 88 91 82 75      Studies: US Abdomen Complete  05/21/2013   *RADIOLOGY REPORT*  Clinical Data:  Abnormal liver function  tests, history of carcinoma of the colon completed chemotherapy in 2007.  Also, history of uterine carcinoma many years ago.  COMPLETE ABDOMINAL ULTRASOUND  Comparison:  Ultrasound of the abdomen of 06/12/2011 and CT abdomen and pelvis of 03/29/2010  Findings:  Gallbladder:  The gallbladder wall is noted to be thickened measuring up to 8.1 mm diffusely.  This can be seen with acute cholecystitis, but no gallstones are seen and there is no pain over the gallbladder.  No definite pericholecystic fluid is seen.  Other considerations are that of hyperproteinemia, congestive heart failure, and cirrhosis.  Common bile duct:  The common bile duct is normal measuring 4.0 mm in diameter.  Liver:  The liver is somewhat inhomogeneous which suggests fatty infiltration.  No focal hepatic lesion is seen and no ductal dilatation is noted.  There is a small amount of perihepatic fluid present.  IVC:  Appears normal.  Pancreas:  The pancreas is obscured by bowel gas and cannot be evaluated.  There needed  Spleen:  The spleen is enlarged measuring 12.5 cm with a volume of 414 ml.  Right Kidney:  No hydronephrosis is seen.  The right kidney measures 9.4 cm sagittally.  Left Kidney:  No hydronephrosis is noted.  The contours of the left kidney are slightly lobular, with left kidney measuring 8.9 cm.  Abdominal aorta:  The abdominal aorta appears normal in caliber proximally, but is obscured distally by bowel gas.  IMPRESSION:  1.  Inhomogeneous liver suggestive of fatty infiltration.  There is a small amount of perihepatic fluid present.  Question ascites. 2.  Diffusely thickened gallbladder wall as noted above.  No gallstones or pain over the gallbladder currently. 3.  Splenomegaly. 4.  Pancreas and portions of the abdominal aorta are obscured by bowel gas.   Original Report Authenticated By: Dwyane Dee, M.D.    Scheduled Meds: . antiseptic oral rinse  15 mL Mouth Rinse q12n4p  . chlorhexidine  15 mL Mouth Rinse BID  .  lisinopril  40 mg Oral Daily  . pantoprazole  40 mg Oral QHS  . PARoxetine  40 mg Oral Daily  . sodium chloride  3 mL Intravenous Q12H   Continuous Infusions: . heparin 1,000 Units/hr (05/22/13 1209)    Principal Problem:   Abdominal pain Active Problems:   Colon cancer   Alcohol abuse   Tobacco abuse   Pancreatitis   Mesenteric vein thrombosis    Conley Canal / APP  [facility] Pager [phone]. If 7PM-7AM, please contact night-coverage at [email or web address], password [facility] 05/22/2013, 12:22 PM  LOS: 2 days     Attending Patient seen and examined, still with abdominal pain, await Repeat CT Abdomen today-belly exam reveals diffuse tenderness but is soft without rebound. Appreciate GI and VVS input  S Ghimire

## 2013-05-22 NOTE — Consult Note (Signed)
I agree with the above. The patient has been seen and examined. She has diffuse abdominal pain. She does not have an acute abdomen. Her extremities are warm and well perfused with palpable pedal and femoral pulses. She has an outside CT scan which shows nonocclusive mesenteric thrombosis. We have ordered a repeat CT scan as this is been at least 2 days since her prior scan. I agree with heparinization. There is no role for surgical intervention at this time. We will continue to follow patient while she is in the hospital.  Laura Farley

## 2013-05-22 NOTE — Progress Notes (Signed)
[facility] Progress Note  Subjective: The patient is still complaining of a diffuse abdominal pain. Stool studies still pending. She is currently being seen by vascular surgery to help [place] with sorting out further plans and treatment in regards to the superior mesenteric vein thrombus. In the meanwhile she has been started on heparin.  Objective: Vital signs in last 24 hours: Temp:  [97.9 F (36.6 C)-98.5 F (36.9 C)] 97.9 F (36.6 C) (06/05 0615) Pulse Rate:  [70-77] 73 (06/05 0615) Resp:  [18-20] 20 (06/05 0615) BP: (162-175)/(80-93) 168/86 mmHg (06/05 0615) SpO2:  [93 %-95 %] 94 % (06/05 0615) Weight:  [59.3 kg (130 lb 11.7 oz)] 59.3 kg (130 lb 11.7 oz) (06/05 0625) Weight change: 11.7 kg (25 lb 12.7 oz)   PE:  Abdomen: Soft, but diffuse mild discomfort to palpation.  Lab Results: Results for orders placed during the hospital encounter of 05/20/13 (from the past 24 hour(s))  GLUCOSE, CAPILLARY     Status: None   Collection Time    05/21/13 11:34 AM      Result Value Range   Glucose-Capillary 83  70 - 99 mg/dL  CLOSTRIDIUM DIFFICILE BY PCR     Status: None   Collection Time    05/21/13  3:10 PM      Result Value Range   C difficile by pcr NEGATIVE  NEGATIVE  GLUCOSE, CAPILLARY     Status: None   Collection Time    05/21/13  5:03 PM      Result Value Range   Glucose-Capillary 94  70 - 99 mg/dL  GLUCOSE, CAPILLARY     Status: None   Collection Time    05/21/13  8:21 PM      Result Value Range   Glucose-Capillary 92  70 - 99 mg/dL   Comment 1 Notify RN     Comment 2 Documented in Chart    HEPARIN LEVEL (UNFRACTIONATED)     Status: Abnormal   Collection Time    05/21/13 10:45 PM      Result Value Range   Heparin Unfractionated 0.28 (*) 0.30 - 0.70 IU/mL  GLUCOSE, CAPILLARY     Status: None   Collection Time    05/21/13 11:56 PM      Result Value Range   Glucose-Capillary 88  70 - 99 mg/dL   Comment 1 Notify RN     Comment 2 Documented in Chart     GLUCOSE, CAPILLARY     Status: None   Collection Time    05/22/13  4:48 AM      Result Value Range   Glucose-Capillary 91  70 - 99 mg/dL  COMPREHENSIVE METABOLIC PANEL     Status: Abnormal   Collection Time    05/22/13  6:10 AM      Result Value Range   Sodium 142  135 - 145 mEq/L   Potassium 4.5  3.5 - 5.1 mEq/L   Chloride 109  96 - 112 mEq/L   CO2 27  19 - 32 mEq/L   Glucose, Bld 78  70 - 99 mg/dL   BUN 3 (*) 6 - 23 mg/dL   Creatinine, Ser 9.60  0.50 - 1.10 mg/dL   Calcium 8.6  8.4 - 45.4 mg/dL   Total Protein 5.9 (*) 6.0 - 8.3 g/dL   Albumin 3.1 (*) 3.5 - 5.2 g/dL   AST 40 (*) 0 - 37 U/L   ALT 28  0 - 35 U/L   Alkaline Phosphatase 165 (*)  39 - 117 U/L   Total Bilirubin 0.9  0.3 - 1.2 mg/dL   GFR calc non Af Amer >90  >90 mL/min   GFR calc Af Amer >90  >90 mL/min  PROTIME-INR     Status: None   Collection Time    05/22/13  6:10 AM      Result Value Range   Prothrombin Time 14.6  11.6 - 15.2 seconds   INR 1.16  0.00 - 1.49  CBC     Status: Abnormal   Collection Time    05/22/13  6:10 AM      Result Value Range   WBC 5.5  4.0 - 10.5 K/uL   RBC 4.02  3.87 - 5.11 MIL/uL   Hemoglobin 13.7  12.0 - 15.0 g/dL   HCT 16.1  09.6 - 04.5 %   MCV 104.2 (*) 78.0 - 100.0 fL   MCH 34.1 (*) 26.0 - 34.0 pg   MCHC 32.7  30.0 - 36.0 g/dL   RDW 40.9 (*) 81.1 - 91.4 %   Platelets 139 (*) 150 - 400 K/uL  GLUCOSE, CAPILLARY     Status: None   Collection Time    05/22/13  7:52 AM      Result Value Range   Glucose-Capillary 82  70 - 99 mg/dL   Comment 1 Documented in Chart     Comment 2 Notify RN      Studies/Results: @[id number] @    Assessment: Abdominal pain  Superior mesenteric vein thrombosis, being evaluated by vascular surgery at this time. Appreciate their help.  Plan: Continue current plan    Jalina Blowers F 05/22/2013, 9:11 AM

## 2013-05-22 NOTE — Consult Note (Signed)
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 Jeani Hawking PCP:  Dr. Everlene Other, [phone].    Laura Farley is an 57 y.o. female.  HPI: 57 y/o female with 3-4 weeks of abdominal discomfort.  Started with some nausea and vomiting, then diarrhea, followed by abdominal distension, weight gain, (30 pounds) and now pain with eating till she has a bowel movement.  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  Spleen is also at upper limits of normal due to portal venous hypertension.  She has been started on heparin, her pain is better.  She is taking allot of pain medicine, but is currently comfortable.  She says it still hurts after eating and improves with bowel movement after eating.  Past Medical History  Diagnosis Date   Hx of GI bleed with esophageal varacies, Portal hypertensive gastropathy Hiatal hernia on EGD 02/10/12  Dr. Elnoria Howard    Hx of ETOH use heavy in past down to weekends with husband 1-2 drinks per weekend.    Tobacco use  1PPD for 40 years, slowed down to .5PPD a couple weeks ago.    Depression and Anxiety   . Hypertension   . Seizures  withdraw from xanax  2 year ago     . Colon cancer with partial right colectomy, colostomy, then closure of the colostomy 2005   . 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   Marland Kitchen Uterine cancer     Past Surgical History  Procedure Laterality Date  . Colon surgery  2006  . Abdominal hysterectomy  1988    cancer  . Broken arm  2012    left  . Clavicle surgery  2012    left  . Tonsillectomy    . Tunneled venous port placement  2006  . Port-a-cath removal  10/31/2011    Procedure: REMOVAL PORT-A-CATH;  Surgeon: Atilano Ina, MD;  Location: [facility];  Service: General;   Laterality: N/A;  port-a-cath removal  . Esophagogastroduodenoscopy  02/10/2012    Procedure: ESOPHAGOGASTRODUODENOSCOPY (EGD);  Surgeon: Theda Belfast, MD;  Location: [facility] ENDOSCOPY;  Service: Endoscopy;  Laterality: N/A;  2 D ECHO  Is ordered  Family History  Problem Relation Age of Onset  . Cancer Father     prostate    Social History:  reports that she has been smoking.  She does not have any smokeless tobacco history on file. She reports that she does not drink alcohol or use illicit drugs. Tobacco: 1PPD for 40 years ETOH:  Heavy in past, now just a couple drinks per weekend. Drugs:  She was on xanax with withdrawal before She is on disability, but she doesn't seem sure of reason.  Allergies:  Allergies  Allergen Reactions  . Venlafaxine Hcl Other (See Comments)    Shortness of breath, irritable and mean   Current Diet:  Cardiac Medications Prior to Admission:  Prescriptions prior to admission  Medication Sig Dispense Refill  . Calcium Carbonate-Vitamin D (CALCIUM + D PO) Take 1 tablet by mouth every evening.      . clonazePAM (KLONOPIN) 2 MG tablet Take 2 mg by mouth 2 (two) times daily as needed.       Marland Kitchen lisinopril (PRINIVIL,ZESTRIL) 40 MG tablet Take 40 mg by  mouth daily.      . Multiple Vitamin (MULTIVITAMIN WITH MINERALS) TABS Take 1 tablet by mouth every evening.      . naproxen sodium (ANAPROX) 220 MG tablet Take 440 mg by mouth daily as needed (for pain).      . pantoprazole (PROTONIX) 40 MG tablet Take 40 mg by mouth daily.      Marland Kitchen PARoxetine (PAXIL) 40 MG tablet Take 40 mg by mouth every morning.      Marland Kitchen lisinopril-hydrochlorothiazide (PRINZIDE,ZESTORETIC) 20-25 MG per tablet Take 1 tablet by mouth daily.       Scheduled: . amLODipine  10 mg Oral Daily  . antiseptic oral rinse  15 mL Mouth Rinse q12n4p  . chlorhexidine  15 mL Mouth Rinse BID  . lisinopril  40 mg Oral Daily  . pantoprazole  40 mg Oral QHS  . PARoxetine  40 mg Oral Daily  . sodium chloride  3 mL  Intravenous Q12H   Continuous: . heparin 1,000 Units/hr (05/22/13 1209)   ZOX:[id number], acetaminophen, clonazePAM, hydrALAZINE, HYDROmorphone (DILAUDID) injection, ondansetron (ZOFRAN) IV, ondansetron Anti-infectives   None      Results for orders placed during the hospital encounter of 05/20/13 (from the past 48 hour(s))  CBC WITH DIFFERENTIAL     Status: Abnormal   Collection Time    05/20/13  6:07 PM      Result Value Range   WBC 6.1  4.0 - 10.5 K/uL   RBC 4.39  3.87 - 5.11 MIL/uL   Hemoglobin 15.1 (*) 12.0 - 15.0 g/dL   HCT 14.7  82.9 - 56.2 %   MCV 99.5  78.0 - 100.0 fL   MCH 34.4 (*) 26.0 - 34.0 pg   MCHC 34.6  30.0 - 36.0 g/dL   RDW 13.0 (*) 86.5 - 78.4 %   Platelets 186  150 - 400 K/uL   Neutrophils Relative % 59  43 - 77 %   Neutro Abs 3.6  1.7 - 7.7 K/uL   Lymphocytes Relative 23  12 - 46 %   Lymphs Abs 1.4  0.7 - 4.0 K/uL   Monocytes Relative 14 (*) 3 - 12 %   Monocytes Absolute 0.9  0.1 - 1.0 K/uL   Eosinophils Relative 4  0 - 5 %   Eosinophils Absolute 0.2  0.0 - 0.7 K/uL   Basophils Relative 1  0 - 1 %   Basophils Absolute 0.0  0.0 - 0.1 K/uL  COMPREHENSIVE METABOLIC PANEL     Status: Abnormal   Collection Time    05/20/13  6:07 PM      Result Value Range   Sodium 141  135 - 145 mEq/L   Potassium 3.0 (*) 3.5 - 5.1 mEq/L   Chloride 102  96 - 112 mEq/L   CO2 29  19 - 32 mEq/L   Glucose, Bld 102 (*) 70 - 99 mg/dL   BUN 6  6 - 23 mg/dL   Creatinine, Ser 6.96  0.50 - 1.10 mg/dL   Calcium 9.5  8.4 - 29.5 mg/dL   Total Protein 6.6  6.0 - 8.3 g/dL   Albumin 3.5  3.5 - 5.2 g/dL   AST 56 (*) 0 - 37 U/L   ALT 37 (*) 0 - 35 U/L   Alkaline Phosphatase 226 (*) 39 - 117 U/L   Total Bilirubin 0.9  0.3 - 1.2 mg/dL   GFR calc non Af Amer >90  >90 mL/min   GFR calc Af Amer >90  >  90 mL/min   Comment:            The eGFR has been calculated     using the CKD EPI equation.     This calculation has not been     validated in all clinical     situations.      eGFR's persistently     <90 mL/min signify     possible Chronic Kidney Disease.  LIPASE, BLOOD     Status: Abnormal   Collection Time    05/20/13  6:07 PM      Result Value Range   Lipase 191 (*) 11 - 59 U/L  POCT I-STAT TROPONIN I     Status: None   Collection Time    05/20/13  6:12 PM      Result Value Range   Troponin i, poc 0.00  0.00 - 0.08 ng/mL   Comment 3            Comment: Due to the release kinetics of cTnI,     a negative result within the first hours     of the onset of symptoms does not rule out     myocardial infarction with certainty.     If myocardial infarction is still suspected,     repeat the test at appropriate intervals.  URINALYSIS, ROUTINE W REFLEX MICROSCOPIC     Status: Abnormal   Collection Time    05/20/13  6:22 PM      Result Value Range   Color, Urine AMBER (*) YELLOW   Comment: BIOCHEMICALS MAY BE AFFECTED BY COLOR   APPearance CLEAR  CLEAR   Specific Gravity, Urine 1.010  1.005 - 1.030   pH 7.0  5.0 - 8.0   Glucose, UA NEGATIVE  NEGATIVE mg/dL   Hgb urine dipstick SMALL (*) NEGATIVE   Bilirubin Urine SMALL (*) NEGATIVE   Ketones, ur NEGATIVE  NEGATIVE mg/dL   Protein, ur NEGATIVE  NEGATIVE mg/dL   Urobilinogen, UA 1.0  0.0 - 1.0 mg/dL   Nitrite NEGATIVE  NEGATIVE   Leukocytes, UA NEGATIVE  NEGATIVE  URINE MICROSCOPIC-ADD ON     Status: Abnormal   Collection Time    05/20/13  6:22 PM      Result Value Range   Squamous Epithelial / LPF RARE  RARE   WBC, UA 0-2  <3 WBC/hpf   RBC / HPF 0-2  <3 RBC/hpf   Bacteria, UA RARE  RARE   Crystals CA OXALATE CRYSTALS (*) NEGATIVE   Urine-Other AMORPHOUS URATES/PHOSPHATES     Comment: LESS THAN 10 mL OF URINE SUBMITTED  LACTIC ACID, PLASMA     Status: None   Collection Time    05/20/13 11:31 PM      Result Value Range   Lactic Acid, Venous 0.5  0.5 - 2.2 mmol/L  GLUCOSE, CAPILLARY     Status: None   Collection Time    05/21/13 12:07 AM      Result Value Range   Glucose-Capillary 89  70 - 99  mg/dL  GLUCOSE, CAPILLARY     Status: None   Collection Time    05/21/13  3:59 AM      Result Value Range   Glucose-Capillary 90  70 - 99 mg/dL   Comment 1 Documented in Chart     Comment 2 Notify RN    COMPREHENSIVE METABOLIC PANEL     Status: Abnormal   Collection Time    05/21/13  6:00 AM  Result Value Range   Sodium 143  135 - 145 mEq/L   Potassium 3.1 (*) 3.5 - 5.1 mEq/L   Chloride 109  96 - 112 mEq/L   CO2 26  19 - 32 mEq/L   Glucose, Bld 89  70 - 99 mg/dL   BUN 5 (*) 6 - 23 mg/dL   Creatinine, Ser 9.62  0.50 - 1.10 mg/dL   Calcium 8.1 (*) 8.4 - 10.5 mg/dL   Total Protein 5.6 (*) 6.0 - 8.3 g/dL   Albumin 3.0 (*) 3.5 - 5.2 g/dL   AST 35  0 - 37 U/L   ALT 28  0 - 35 U/L   Alkaline Phosphatase 186 (*) 39 - 117 U/L   Total Bilirubin 0.8  0.3 - 1.2 mg/dL   GFR calc non Af Amer >90  >90 mL/min   GFR calc Af Amer >90  >90 mL/min   Comment:            The eGFR has been calculated     using the CKD EPI equation.     This calculation has not been     validated in all clinical     situations.     eGFR's persistently     <90 mL/min signify     possible Chronic Kidney Disease.  CBC WITH DIFFERENTIAL     Status: Abnormal   Collection Time    05/21/13  6:00 AM      Result Value Range   WBC 6.0  4.0 - 10.5 K/uL   RBC 4.10  3.87 - 5.11 MIL/uL   Hemoglobin 13.8  12.0 - 15.0 g/dL   HCT 95.2  84.1 - 32.4 %   MCV 102.2 (*) 78.0 - 100.0 fL   MCH 33.7  26.0 - 34.0 pg   MCHC 32.9  30.0 - 36.0 g/dL   RDW 40.1 (*) 02.7 - 25.3 %   Platelets 156  150 - 400 K/uL   Neutrophils Relative % 53  43 - 77 %   Neutro Abs 3.2  1.7 - 7.7 K/uL   Lymphocytes Relative 24  12 - 46 %   Lymphs Abs 1.4  0.7 - 4.0 K/uL   Monocytes Relative 17 (*) 3 - 12 %   Monocytes Absolute 1.0  0.1 - 1.0 K/uL   Eosinophils Relative 6 (*) 0 - 5 %   Eosinophils Absolute 0.4  0.0 - 0.7 K/uL   Basophils Relative 1  0 - 1 %   Basophils Absolute 0.1  0.0 - 0.1 K/uL  LACTIC ACID, PLASMA     Status: None    Collection Time    05/21/13  6:00 AM      Result Value Range   Lactic Acid, Venous 0.6  0.5 - 2.2 mmol/L  LIPASE, BLOOD     Status: None   Collection Time    05/21/13  6:00 AM      Result Value Range   Lipase 42  11 - 59 U/L  GLUCOSE, CAPILLARY     Status: None   Collection Time    05/21/13  7:58 AM      Result Value Range   Glucose-Capillary 95  70 - 99 mg/dL  GLUCOSE, CAPILLARY     Status: None   Collection Time    05/21/13 11:34 AM      Result Value Range   Glucose-Capillary 83  70 - 99 mg/dL  CLOSTRIDIUM DIFFICILE BY PCR     Status: None  Collection Time    05/21/13  3:10 PM      Result Value Range   C difficile by pcr NEGATIVE  NEGATIVE  GLUCOSE, CAPILLARY     Status: None   Collection Time    05/21/13  5:03 PM      Result Value Range   Glucose-Capillary 94  70 - 99 mg/dL  GLUCOSE, CAPILLARY     Status: None   Collection Time    05/21/13  8:21 PM      Result Value Range   Glucose-Capillary 92  70 - 99 mg/dL   Comment 1 Notify RN     Comment 2 Documented in Chart    HEPARIN LEVEL (UNFRACTIONATED)     Status: Abnormal   Collection Time    05/21/13 10:45 PM      Result Value Range   Heparin Unfractionated 0.28 (*) 0.30 - 0.70 IU/mL   Comment:            IF HEPARIN RESULTS ARE BELOW     EXPECTED VALUES, AND PATIENT     DOSAGE HAS BEEN CONFIRMED,     SUGGEST FOLLOW UP TESTING     OF ANTITHROMBIN III LEVELS.  GLUCOSE, CAPILLARY     Status: None   Collection Time    05/21/13 11:56 PM      Result Value Range   Glucose-Capillary 88  70 - 99 mg/dL   Comment 1 Notify RN     Comment 2 Documented in Chart    GLUCOSE, CAPILLARY     Status: None   Collection Time    05/22/13  4:48 AM      Result Value Range   Glucose-Capillary 91  70 - 99 mg/dL  COMPREHENSIVE METABOLIC PANEL     Status: Abnormal   Collection Time    05/22/13  6:10 AM      Result Value Range   Sodium 142  135 - 145 mEq/L   Potassium 4.5  3.5 - 5.1 mEq/L   Chloride 109  96 - 112 mEq/L   CO2 27   19 - 32 mEq/L   Glucose, Bld 78  70 - 99 mg/dL   BUN 3 (*) 6 - 23 mg/dL   Creatinine, Ser 1.61  0.50 - 1.10 mg/dL   Calcium 8.6  8.4 - 09.6 mg/dL   Total Protein 5.9 (*) 6.0 - 8.3 g/dL   Albumin 3.1 (*) 3.5 - 5.2 g/dL   AST 40 (*) 0 - 37 U/L   ALT 28  0 - 35 U/L   Alkaline Phosphatase 165 (*) 39 - 117 U/L   Total Bilirubin 0.9  0.3 - 1.2 mg/dL   GFR calc non Af Amer >90  >90 mL/min   GFR calc Af Amer >90  >90 mL/min   Comment:            The eGFR has been calculated     using the CKD EPI equation.     This calculation has not been     validated in all clinical     situations.     eGFR's persistently     <90 mL/min signify     possible Chronic Kidney Disease.  PROTIME-INR     Status: None   Collection Time    05/22/13  6:10 AM      Result Value Range   Prothrombin Time 14.6  11.6 - 15.2 seconds   INR 1.16  0.00 - 1.49  CBC  Status: Abnormal   Collection Time    05/22/13  6:10 AM      Result Value Range   WBC 5.5  4.0 - 10.5 K/uL   RBC 4.02  3.87 - 5.11 MIL/uL   Hemoglobin 13.7  12.0 - 15.0 g/dL   HCT 16.1  09.6 - 04.5 %   MCV 104.2 (*) 78.0 - 100.0 fL   MCH 34.1 (*) 26.0 - 34.0 pg   MCHC 32.7  30.0 - 36.0 g/dL   RDW 40.9 (*) 81.1 - 91.4 %   Platelets 139 (*) 150 - 400 K/uL  GLUCOSE, CAPILLARY     Status: None   Collection Time    05/22/13  7:52 AM      Result Value Range   Glucose-Capillary 82  70 - 99 mg/dL   Comment 1 Documented in Chart     Comment 2 Notify RN    HEPARIN LEVEL (UNFRACTIONATED)     Status: Abnormal   Collection Time    05/22/13  9:48 AM      Result Value Range   Heparin Unfractionated 0.19 (*) 0.30 - 0.70 IU/mL   Comment:            IF HEPARIN RESULTS ARE BELOW     EXPECTED VALUES, AND PATIENT     DOSAGE HAS BEEN CONFIRMED,     SUGGEST FOLLOW UP TESTING     OF ANTITHROMBIN III LEVELS.  GLUCOSE, CAPILLARY     Status: None   Collection Time    05/22/13 12:04 PM      Result Value Range   Glucose-Capillary 75  70 - 99 mg/dL   Comment 1  Documented in Chart     Comment 2 Notify RN      US Abdomen Complete  05/21/2013   *RADIOLOGY REPORT*  Clinical Data:  Abnormal liver function tests, history of carcinoma of the colon completed chemotherapy in 2007.  Also, history of uterine carcinoma many years ago.  COMPLETE ABDOMINAL ULTRASOUND  Comparison:  Ultrasound of the abdomen of 06/12/2011 and CT abdomen and pelvis of 03/29/2010  Findings:  Gallbladder:  The gallbladder wall is noted to be thickened measuring up to 8.1 mm diffusely.  This can be seen with acute cholecystitis, but no gallstones are seen and there is no pain over the gallbladder.  No definite pericholecystic fluid is seen.  Other considerations are that of hyperproteinemia, congestive heart failure, and cirrhosis.  Common bile duct:  The common bile duct is normal measuring 4.0 mm in diameter.  Liver:  The liver is somewhat inhomogeneous which suggests fatty infiltration.  No focal hepatic lesion is seen and no ductal dilatation is noted.  There is a small amount of perihepatic fluid present.  IVC:  Appears normal.  Pancreas:  The pancreas is obscured by bowel gas and cannot be evaluated.  There needed  Spleen:  The spleen is enlarged measuring 12.5 cm with a volume of 414 ml.  Right Kidney:  No hydronephrosis is seen.  The right kidney measures 9.4 cm sagittally.  Left Kidney:  No hydronephrosis is noted.  The contours of the left kidney are slightly lobular, with left kidney measuring 8.9 cm.  Abdominal aorta:  The abdominal aorta appears normal in caliber proximally, but is obscured distally by bowel gas.  IMPRESSION:  1.  Inhomogeneous liver suggestive of fatty infiltration.  There is a small amount of perihepatic fluid present.  Question ascites. 2.  Diffusely thickened gallbladder wall as noted above.  No gallstones or pain over the gallbladder currently. 3.  Splenomegaly. 4.  Pancreas and portions of the abdominal aorta are obscured by bowel gas.   Original Report Authenticated  By: Dwyane Dee, M.D.   Ct Abdomen Pelvis W Contrast  05/22/2013   *RADIOLOGY REPORT*  Clinical Data: Diffuse abdominal pain with diarrhea.  CT ABDOMEN AND PELVIS WITH CONTRAST  Technique:  Multidetector CT imaging of the abdomen and pelvis was performed following the standard protocol during bolus administration of intravenous contrast.  Contrast: OMNIPAQUE IOHEXOL 300 MG/ML  SOLN  Comparison: Ultrasound 05/21/2013. 02/16/2009.  Findings: Lung Bases: Scattered areas of subpleural scarring and / or atelectasis.  Trace right small right pleural effusion layering dependently.  Esophageal varices.  Descending thoracic aortic atherosclerosis.  Liver:  Mildly heterogeneous perfusion of the liver.  No intrahepatic portal venous thrombosis.  No biliary ductal dilation. Perihepatic ascites.  Spleen:  Borderline splenomegaly.  Gallbladder:  High-density material is present within the gallbladder.  Ascites around the gallbladder is well.  High density material may represent biliary sludge or vicarious excretion of contrast if the patient has had a other imaging.  Common bile duct:  No dilation.  No calcified common duct stone.  Pancreas:  No inflammatory changes or mass lesion.  Dystrophic calcification near the ampulla.  This is unchanged compared to 02/16/2009.  Adrenal glands:  Normal.  Kidneys:  Normal renal enhancement.  Tiny low density lesion in the interpolar left kidney probably represents a cyst but is too small to characterize definitively.  Normal renal excretion.  The ureters appear within normal limits.  Stomach:  As noted above, as noted above, esophageal varices are present.  There also gastric varices with mural thickening of the gastric fundus extending to the antrum.  This is nonspecific and may be related to portal venous hypertension.  Small bowel:  Diffuse stranding of the small bowel mesentery compatible with "misty mesentery".  Based on other findings, this is most compatible with portal venous  congestion.  Colon:   Ileocolic anastomoses in the right lower quadrant. Diffuse edema of the proximal colon wall.  This is probably also secondary to portal venous and mesenteric congestion rather than inflammatory bowel disease or infection.  Pelvic Genitourinary:  Hysterectomy.  Moderate volume of ascites in the pelvis.  Urinary bladder appears normal.  Retroperitoneal collaterals are present associated with portal hypertension.  Bones:  No aggressive osseous lesions.  Lumbar spondylosis. Bilateral L5 pars defects with grade 1 anterolisthesis of L5 on S1. L4-L5 and L5-S1 predominant degenerative disease.  Vasculature: Aortoiliac atherosclerosis without aneurysm.  Portal vein thrombosis present which is nonocclusive.  This is best visualized on axial image number 31 series 2 and extends into the superior mesenteric vein.  The splenic vein and inferior mesenteric vein appear within normal limits.  Suspect extension of thrombus into the ileocolic drainage (image number 44 series 2).  Body Wall: Grossly normal.  IMPRESSION: 1.  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 2.  Spleen upper limits of normal, probably due to portal venous hypertension and congestion. 3.  Moderate ascites. 4.  Mural thickening of the stomach and proximal colon, probably due to the mesenteric venous congestion.  No definite mesenteric venous infarct. 5.  High-density material in the gallbladder.  Reportedly the patient has had a recent CT and this is most compatible with vicarious excretion of contrast.   Original Report Authenticated By: Andreas Newport, M.D.    Review of Systems  Constitutional: Negative for fever, chills,  weight loss, malaise/fatigue and diaphoresis.       Weight gain up to 30 pounds over the last 3-4 weeks.  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 Negative for ear pain, nosebleeds, sore throat, neck pain and ear discharge.   Eyes: Negative for  double vision, photophobia, pain, discharge and redness.       Vision worse, has not seen anyone    Respiratory: Positive for cough, shortness of breath (with exertion) and wheezing. Negative for stridor.        Smoking for 40 years 1PPD, down to .5 PPD since she got sick 3-4 weeks ago.  Cardiovascular: Negative.   Gastrointestinal: Positive for nausea, vomiting, abdominal pain and diarrhea. Negative for heartburn, constipation, blood in stool and melena.       Started with some nausea and vomiting, better now complains of pain after eating till she has a BM GI:  Dr. Elnoria Howard  Genitourinary: Positive for frequency.  Musculoskeletal: Negative for myalgias, back pain, joint pain and falls.       Chronic lower leg and foot pain since chemotheraphy  Skin: Positive for rash (occasional, some ecchymosis). Negative for itching.  Neurological: Positive for seizures (Remote history, with coming off Xanax about 2 years ago.  None recently.). Negative for weakness.  Endo/Heme/Allergies: Negative for environmental allergies and polydipsia. Bruises/bleeds easily.  Psychiatric/Behavioral: Positive for depression. The patient is nervous/anxious.    Blood pressure 170/92, pulse 73, temperature 97.9 F (36.6 C), temperature source Oral, resp. rate 20, height 5\' 1"  (1.549 m), weight 59.3 kg (130 lb 11.7 oz), SpO2 94.00%. Physical Exam  Constitutional: She is oriented to person, place, and time. No distress.  Thin chronically ill female, NAD BP 158/95  Pulse 72  Temp(Src) 98.1 F (36.7 C) (Oral)  Resp 18  Ht 5\' 1"  (1.549 m)  Wt 59.3 kg (130 lb 11.7 oz)  BMI 24.71 kg/m2  SpO2 93%  HENT:  Head: Normocephalic and atraumatic.  Nose: Nose normal.  Mouth/Throat: No oropharyngeal exudate.  Eyes: Conjunctivae and EOM are normal. Pupils are equal, round, and reactive to light. Right eye exhibits no discharge. Left eye exhibits no discharge. No scleral icterus.  Neck: Normal range of motion. Neck supple. No JVD  present. No tracheal deviation present. No thyromegaly present.  Cardiovascular: Normal rate, regular rhythm, normal heart sounds and intact distal pulses.  Exam reveals no gallop.   No murmur heard. Respiratory: Effort normal and breath sounds normal. No respiratory distress. She has no wheezes. She has no rales. She exhibits no tenderness.  GI: Soft. Bowel sounds are normal. She exhibits no distension and no mass. There is tenderness (right now she is not very tender, soft abdomen, she was laughing some on my exam without pain.). There is no rebound and no guarding.  She has tortuous abdominal wall venous pattern. Prior  Partial colectomy with colostomy, now reversed  Musculoskeletal: She exhibits no edema.  Lymphadenopathy:    She has no cervical adenopathy.  Neurological: She is alert and oriented to person, place, and time. No cranial nerve deficit.  Skin: Skin is warm and dry. No rash noted. She is not diaphoretic. No erythema. No pallor.  She has some ecchymosis    Psychiatric: She has a normal mood and affect. Her behavior is normal. Judgment and thought content normal.    Assessment/Plan: 1.  Abdominal pain with SMV thrombus 2. New thickening of the proximal colon and stomach most likely due  to venous congestion 3. Hx of esophageal varcies/portal hypertensive gastropathy 4.GI bleed 01/2012 5.  ETOH use, heavy in the past, now down to a couple drinks per weekend. 6.  Hypertension 7. Tobacco use 8.Hx of colon cancer with colostomy and revision, chemotherapy. 9. Post chemotherapy lower extremity neuropathy 10. Anxiety and depression 11.Hx of uterine cancer.  Plan:  Hydration, ongoing anticoagulation, medical management. She currently does not have a surgical abdomen. She feels better but still has pain after eating.  She is on a heart healthy diet. She has been seen and examined by Dr. Luisa Hart.  Will Marlyne Beards PA-C for Dr. Luisa Hart. Tavarus Poteete 05/22/2013, 4:15 PM

## 2013-05-22 NOTE — Progress Notes (Signed)
ANTICOAGULATION CONSULT NOTE  Pharmacy Consult for heparin Indication: mesenteric thrombosis  Allergies  Allergen Reactions  . Venlafaxine Hcl Other (See Comments)    Shortness of breath, irritable and mean    Patient Measurements: Height: 5\' 1"  (154.9 cm) Weight: 130 lb 11.7 oz (59.3 kg) IBW/kg (Calculated) : 47.8 Heparin Dosing Weight: 47.5 kg  Vital Signs: Temp: 98.1 F (36.7 C) (06/05 1500) Temp src: Oral (06/05 1500) BP: 158/95 mmHg (06/05 1500) Pulse Rate: 72 (06/05 1500)  Labs:  Recent Labs  05/20/13 1807 05/21/13 0600 05/21/13 2245 05/22/13 0610 05/22/13 0948 05/22/13 1800  HGB 15.1* 13.8  --  13.7  --   --   HCT 43.7 41.9  --  41.9  --   --   PLT 186 156  --  139*  --   --   LABPROT  --   --   --  14.6  --   --   INR  --   --   --  1.16  --   --   HEPARINUNFRC  --   --  0.28*  --  0.19* 0.41  CREATININE 0.75 0.76  --  0.72  --   --     Estimated Creatinine Clearance: 63.4 ml/min (by C-G formula based on Cr of 0.72).    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 Anticoagulation with IV heparin infusion.  Today's AM heparin level is 0.19 on 800 units/hr IV heparin. Level is subtherapeutic and has decreased despite an increase in the heparin drip rate last night.  The patient is still complaining of a diffuse abdominal pain. PLTC decreased to 139k, Hgb 13.7 from 15.1.  No bleeding noted. Heparin drip rate increased to1000 uts/hr after a 200 ut bolus this morning.  Evening recheck HL 0.41 at goal.    No bleeding noted.  Goal of Therapy:  Heparin level 0.3-0.7 units/ml Monitor platelets by anticoagulation protocol: Yes   Plan:  Continue Heparin drip rate 1000 uts/hr Daily CBC/HL  Leota Sauers Pharm.D. CPP, BCPS Clinical Pharmacist [phone] 05/22/2013 7:11 PM

## 2013-05-23 DIAGNOSIS — R188 Other ascites: Secondary | ICD-10-CM

## 2013-05-23 LAB — BASIC METABOLIC PANEL
CO2: 28 mEq/L (ref 19–32)
Calcium: 9.3 mg/dL (ref 8.4–10.5)
Chloride: 98 mEq/L (ref 96–112)
Creatinine, Ser: 0.63 mg/dL (ref 0.50–1.10)
Glucose, Bld: 90 mg/dL (ref 70–99)
Sodium: 138 mEq/L (ref 135–145)

## 2013-05-23 LAB — GLUCOSE, CAPILLARY
Glucose-Capillary: 108 mg/dL — ABNORMAL HIGH (ref 70–99)
Glucose-Capillary: 122 mg/dL — ABNORMAL HIGH (ref 70–99)
Glucose-Capillary: 86 mg/dL (ref 70–99)
Glucose-Capillary: 92 mg/dL (ref 70–99)
Glucose-Capillary: 96 mg/dL (ref 70–99)

## 2013-05-23 LAB — CBC
Hemoglobin: 14.9 g/dL (ref 12.0–15.0)
MCH: 33.4 pg (ref 26.0–34.0)
MCV: 101.8 fL — ABNORMAL HIGH (ref 78.0–100.0)
RBC: 4.46 MIL/uL (ref 3.87–5.11)

## 2013-05-23 LAB — LACTIC ACID, PLASMA: Lactic Acid, Venous: 1.2 mmol/L (ref 0.5–2.2)

## 2013-05-23 LAB — ANTITHROMBIN III: AntiThromb III Func: 56 % — ABNORMAL LOW (ref 75–120)

## 2013-05-23 MED ORDER — OXYCODONE HCL 5 MG PO TABS
5.0000 mg | ORAL_TABLET | ORAL | Status: DC | PRN
  Administered 2013-05-23 – 2013-05-24 (×3): 10 mg via ORAL
  Administered 2013-05-25: 5 mg via ORAL
  Filled 2013-05-23: qty 1
  Filled 2013-05-23 (×3): qty 2

## 2013-05-23 MED ORDER — PROPRANOLOL HCL 20 MG PO TABS
20.0000 mg | ORAL_TABLET | Freq: Two times a day (BID) | ORAL | Status: DC
  Administered 2013-05-23 – 2013-05-25 (×5): 20 mg via ORAL
  Filled 2013-05-23 (×6): qty 1

## 2013-05-23 MED ORDER — POTASSIUM CHLORIDE CRYS ER 20 MEQ PO TBCR
40.0000 meq | EXTENDED_RELEASE_TABLET | Freq: Once | ORAL | Status: DC
  Filled 2013-05-23: qty 2

## 2013-05-23 MED ORDER — COUMADIN BOOK
Freq: Once | Status: AC
  Administered 2013-05-23: 21:00:00
  Filled 2013-05-23: qty 1

## 2013-05-23 MED ORDER — POTASSIUM CHLORIDE 20 MEQ/15ML (10%) PO LIQD
40.0000 meq | Freq: Once | ORAL | Status: AC
  Administered 2013-05-23: 40 meq via ORAL
  Filled 2013-05-23: qty 30

## 2013-05-23 MED ORDER — WARFARIN SODIUM 5 MG PO TABS
5.0000 mg | ORAL_TABLET | Freq: Once | ORAL | Status: AC
  Administered 2013-05-23: 5 mg via ORAL
  Filled 2013-05-23 (×2): qty 1

## 2013-05-23 MED ORDER — WARFARIN VIDEO
Freq: Once | Status: AC
  Administered 2013-05-23: 17:00:00

## 2013-05-23 MED ORDER — SPIRONOLACTONE 50 MG PO TABS
50.0000 mg | ORAL_TABLET | Freq: Every day | ORAL | Status: DC
  Administered 2013-05-23 – 2013-05-25 (×3): 50 mg via ORAL
  Filled 2013-05-23 (×3): qty 1

## 2013-05-23 MED ORDER — WARFARIN - PHARMACIST DOSING INPATIENT: Freq: Every day | Status: DC

## 2013-05-23 MED ORDER — NICOTINE 14 MG/24HR TD PT24
14.0000 mg | MEDICATED_PATCH | Freq: Every day | TRANSDERMAL | Status: DC
  Administered 2013-05-23 – 2013-05-25 (×3): 14 mg via TRANSDERMAL
  Filled 2013-05-23 (×3): qty 1

## 2013-05-23 NOTE — Progress Notes (Signed)
ANTICOAGULATION CONSULT NOTE  Pharmacy Consult for heparin Indication: mesenteric thrombosis  Allergies  Allergen Reactions  . Venlafaxine Hcl Other (See Comments)    Shortness of breath, irritable and mean    Patient Measurements: Height: 5\' 1"  (154.9 cm) Weight: 127 lb 14.4 oz (58.015 kg) IBW/kg (Calculated) : 47.8 Heparin Dosing Weight: 47.5 kg  Vital Signs: Temp: 98.3 F (36.8 C) (06/06 0549) Temp src: Oral (06/06 0549) BP: 176/82 mmHg (06/06 0549) Pulse Rate: 71 (06/06 0549)  Labs:  Recent Labs  05/21/13 0600  05/22/13 0610 05/22/13 0948 05/22/13 1800 05/23/13 0520  HGB 13.8  --  13.7  --  15.1* 14.9  HCT 41.9  --  41.9  --  44.5 45.4  PLT 156  --  139*  --  158 155  LABPROT  --   --  14.6  --   --   --   INR  --   --  1.16  --   --   --   HEPARINUNFRC  --   < >  --  0.19* 0.41 0.31  CREATININE 0.76  --  0.72  --   --  0.63  < > = values in this interval not displayed.  Estimated Creatinine Clearance: 62.8 ml/min (by C-G formula based on Cr of 0.63).   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  PLTC 155K stable, and Hgb 14.9 stable.   No bleeding noted.  Therapeutic heparin level but at lowest end of goal range. Will bump heparin rate up today to keep heparin level within goal.  Dr. Jerral Ralph has requested pharmacy to begin coumadin protocol today.  Baseline INR = 1.16 on 05/22/13.   57 yo female, weight = 58 kg, MSV thrombus-->warfarin points score=3   Goal of Therapy:  Heparin level 0.3-0.7 units/ml Monitor platelets by anticoagulation protocol: Yes INR = 2-3   Plan:  Increase heparin drip rate to 1050 units/hr Give Coumadin 5 mg today x1 Check heparin level . PT/INR, CBC daily.   Noah Delaine, RPh Clinical Pharmacist Pager: [phone] 05/23/2013

## 2013-05-23 NOTE — Progress Notes (Signed)
[facility] PROGRESS NOTE  Laura Farley ZOX:[id number] DOB: Sep 11, 1956 DOA: 05/20/2013 PCP: Pcp Not In System PCP:  Zoe Lan, FNP  Assessment/Plan: 1. Non-occlusive superior mesenteric vein thrombosis       -Appreciate GI, Vascular and Surgery consultations-abd pain seems to be slightly better       -Repeat CT abdomen:  Diffuse edema in proximal colon, but no definite venous infarct on CT-Heparin drip        continued       -Start on Coumadin 5mg  po - pharmacy to manage dosing   2.    Abdominal pain        -likely secondary to SMV thrombosis/intestinal ischemia        -discontinue IV pain med and start orals, advance diet-pain slowly improving  3.    Hypertension        -norvasc 10 mg daily (newly added this hospital stay)        -inderal 20mg  bid (newly added this hospital stay)        -lisinopril 40 mg daily  4.  Ascites with significant weight gain         -started on Aldactone 50 mg 6/6         -low salt diet         -daily weight  Code Status: Full Family Communication: no family communication at bedside Disposition Plan: Inpatient   Consultants:  GI, vascular  Procedures:  CT abdomen/pelvis  Antibiotics:  none  HPI/Subjective: Pt still complains of diffuse abdominal pain that has slightly improved. The pain improves with pain medication and with bowel movements.  She describes less "bloating" today and denies any nausea or diarrhea. She has had two "mushy" bowel movements this morning. Denies any melena or hematochezia. She does not want her IV pain meds to be discontinued because it is making her feel better.   Objective: Filed Vitals:   05/22/13 1500 05/22/13 2018 05/23/13 0500 05/23/13 0549  BP: 158/95 149/76  176/82  Pulse: 72 74  71  Temp: 98.1 F (36.7 C) 98.6 F (37 C)  98.3 F (36.8 C)  TempSrc: Oral Oral  Oral  Resp: 18 16  18   Height:      Weight:   58.015 kg (127 lb 14.4 oz)   SpO2: 93% 99%  96%   No intake or output data in the  24 hours ending 05/23/13 1021 Filed Weights   05/21/13 0648 05/22/13 0625 05/23/13 0500  Weight: 47.5 kg (104 lb 11.5 oz) 59.3 kg (130 lb 11.7 oz) 58.015 kg (127 lb 14.4 oz)    Exam:   General:  A&O x 3, lying in bed in NAD, appears improved today  Cardiovascular: rrr, no m/g/r, no peripheral edema  Respiratory: CTAB  Abdomen: +BS, soft,less distention today,  moderately tender in epigastric and RLQ. Liver palpable 2 cm below ribcage  Musculoskeletal: Full ROM, 5/5 strength bilaterally  Data Reviewed: Basic Metabolic Panel:  Recent Labs Lab 05/20/13 1807 05/21/13 0600 05/22/13 0610 05/23/13 0520  NA 141 143 142 138  K 3.0* 3.1* 4.5 3.1*  CL 102 109 109 98  CO2 29 26 27 28   GLUCOSE 102* 89 78 90  BUN 6 5* 3* <3*  CREATININE 0.75 0.76 0.72 0.63  CALCIUM 9.5 8.1* 8.6 9.3   Liver Function Tests:  Recent Labs Lab 05/20/13 1807 05/21/13 0600 05/22/13 0610  AST 56* 35 40*  ALT 37* 28 28  ALKPHOS 226* 186* 165*  BILITOT 0.9 0.8  0.9  PROT 6.6 5.6* 5.9*  ALBUMIN 3.5 3.0* 3.1*    Recent Labs Lab 05/20/13 1807 05/21/13 0600  LIPASE 191* 42   CBC:  Recent Labs Lab 05/20/13 1807 05/21/13 0600 05/22/13 0610 05/22/13 1800 05/23/13 0520  WBC 6.1 6.0 5.5 8.4 7.6  NEUTROABS 3.6 3.2  --   --   --   HGB 15.1* 13.8 13.7 15.1* 14.9  HCT 43.7 41.9 41.9 44.5 45.4  MCV 99.5 102.2* 104.2* 102.1* 101.8*  PLT 186 156 139* 158 155   CBG:  Recent Labs Lab 05/22/13 1711 05/22/13 2022 05/23/13 0009 05/23/13 0506 05/23/13 0746  GLUCAP 91 122* 86 91 96    Recent Results (from the past 240 hour(s))  CLOSTRIDIUM DIFFICILE BY PCR     Status: None   Collection Time    05/21/13  3:10 PM      Result Value Range Status   C difficile by pcr NEGATIVE  NEGATIVE Final     Studies: Ct Abdomen Pelvis W Contrast  05/22/2013   *RADIOLOGY REPORT*  Clinical Data: Diffuse abdominal pain with diarrhea.  CT ABDOMEN AND PELVIS WITH CONTRAST  Technique:  Multidetector CT imaging  of the abdomen and pelvis was performed following the standard protocol during bolus administration of intravenous contrast.  Contrast: OMNIPAQUE IOHEXOL 300 MG/ML  SOLN  Comparison: Ultrasound 05/21/2013. 02/16/2009.  Findings: Lung Bases: Scattered areas of subpleural scarring and / or atelectasis.  Trace right small right pleural effusion layering dependently.  Esophageal varices.  Descending thoracic aortic atherosclerosis.  Liver:  Mildly heterogeneous perfusion of the liver.  No intrahepatic portal venous thrombosis.  No biliary ductal dilation. Perihepatic ascites.  Spleen:  Borderline splenomegaly.  Gallbladder:  High-density material is present within the gallbladder.  Ascites around the gallbladder is well.  High density material may represent biliary sludge or vicarious excretion of contrast if the patient has had a other imaging.  Common bile duct:  No dilation.  No calcified common duct stone.  Pancreas:  No inflammatory changes or mass lesion.  Dystrophic calcification near the ampulla.  This is unchanged compared to 02/16/2009.  Adrenal glands:  Normal.  Kidneys:  Normal renal enhancement.  Tiny low density lesion in the interpolar left kidney probably represents a cyst but is too small to characterize definitively.  Normal renal excretion.  The ureters appear within normal limits.  Stomach:  As noted above, as noted above, esophageal varices are present.  There also gastric varices with mural thickening of the gastric fundus extending to the antrum.  This is nonspecific and may be related to portal venous hypertension.  Small bowel:  Diffuse stranding of the small bowel mesentery compatible with "misty mesentery".  Based on other findings, this is most compatible with portal venous congestion.  Colon:   Ileocolic anastomoses in the right lower quadrant. Diffuse edema of the proximal colon wall.  This is probably also secondary to portal venous and mesenteric congestion rather than inflammatory  bowel disease or infection.  Pelvic Genitourinary:  Hysterectomy.  Moderate volume of ascites in the pelvis.  Urinary bladder appears normal.  Retroperitoneal collaterals are present associated with portal hypertension.  Bones:  No aggressive osseous lesions.  Lumbar spondylosis. Bilateral L5 pars defects with grade 1 anterolisthesis of L5 on S1. L4-L5 and L5-S1 predominant degenerative disease.  Vasculature: Aortoiliac atherosclerosis without aneurysm.  Portal vein thrombosis present which is nonocclusive.  This is best visualized on axial image number 31 series 2 and extends into the superior  mesenteric vein.  The splenic vein and inferior mesenteric vein appear within normal limits.  Suspect extension of thrombus into the ileocolic drainage (image number 44 series 2).  Body Wall: Grossly normal.  IMPRESSION: 1.  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 2.  Spleen upper limits of normal, probably due to portal venous hypertension and congestion. 3.  Moderate ascites. 4.  Mural thickening of the stomach and proximal colon, probably due to the mesenteric venous congestion.  No definite mesenteric venous infarct. 5.  High-density material in the gallbladder.  Reportedly the patient has had a recent CT and this is most compatible with vicarious excretion of contrast.   Original Report Authenticated By: Andreas Newport, M.D.    Scheduled Meds: . amLODipine  10 mg Oral Daily  . antiseptic oral rinse  15 mL Mouth Rinse q12n4p  . chlorhexidine  15 mL Mouth Rinse BID  . lisinopril  40 mg Oral Daily  . pantoprazole  40 mg Oral QHS  . PARoxetine  40 mg Oral Daily  . potassium chloride  40 mEq Oral Once  . propranolol  20 mg Oral BID  . sodium chloride  3 mL Intravenous Q12H  . spironolactone  50 mg Oral Daily  . warfarin  5 mg Oral ONCE-1800  . Warfarin - Pharmacist Dosing Inpatient   Does not apply q1800   Continuous Infusions: . sodium chloride 40 mL/hr at 05/22/13 1846  . heparin  1,000 Units/hr (05/22/13 1209)    Principal Problem:   Abdominal pain Active Problems:   Colon cancer   Alcohol abuse   Tobacco abuse   Pancreatitis   Mesenteric vein thrombosis    Time spent:     Maris Berger  [facility] Pager [phone]. If 7PM-7AM, please contact night-coverage at [email or web address], password [facility] 05/23/2013, 10:21 AM  LOS: 3 days    Attending Patient seen and examined, agree with the assesment and plan as outlined above. Abd pain better, start coumadin today,continue with Heparin. Stop IVF, ambulate and advance diet.  S Eastin Swing

## 2013-05-23 NOTE — Progress Notes (Signed)
[facility] Progress Note  Subjective: She has less abdominal pain today and states that she feels better. Heparin is being continued.  Objective: Vital signs in last 24 hours: Temp:  [98.1 F (36.7 C)-98.6 F (37 C)] 98.3 F (36.8 C) (06/06 0549) Pulse Rate:  [66-74] 66 (06/06 1109) Resp:  [16-18] 18 (06/06 0549) BP: (149-179)/(76-95) 179/94 mmHg (06/06 1109) SpO2:  [93 %-99 %] 96 % (06/06 0549) Weight:  [58.015 kg (127 lb 14.4 oz)] 58.015 kg (127 lb 14.4 oz) (06/06 0500) Weight change: -1.285 kg (-2 lb 13.3 oz)   PE:  Abdomen: Soft and nontender today.  Lab Results: Results for orders placed during the hospital encounter of 05/20/13 (from the past 24 hour(s))  GLUCOSE, CAPILLARY     Status: None   Collection Time    05/22/13 12:04 PM      Result Value Range   Glucose-Capillary 75  70 - 99 mg/dL   Comment 1 Documented in Chart     Comment 2 Notify RN    GLUCOSE, CAPILLARY     Status: None   Collection Time    05/22/13  5:11 PM      Result Value Range   Glucose-Capillary 91  70 - 99 mg/dL   Comment 1 Documented in Chart     Comment 2 Notify RN    HEPARIN LEVEL (UNFRACTIONATED)     Status: None   Collection Time    05/22/13  6:00 PM      Result Value Range   Heparin Unfractionated 0.41  0.30 - 0.70 IU/mL  CBC     Status: Abnormal   Collection Time    05/22/13  6:00 PM      Result Value Range   WBC 8.4  4.0 - 10.5 K/uL   RBC 4.36  3.87 - 5.11 MIL/uL   Hemoglobin 15.1 (*) 12.0 - 15.0 g/dL   HCT 11.9  14.7 - 82.9 %   MCV 102.1 (*) 78.0 - 100.0 fL   MCH 34.6 (*) 26.0 - 34.0 pg   MCHC 33.9  30.0 - 36.0 g/dL   RDW 56.2 (*) 13.0 - 86.5 %   Platelets 158  150 - 400 K/uL  GLUCOSE, CAPILLARY     Status: Abnormal   Collection Time    05/22/13  8:22 PM      Result Value Range   Glucose-Capillary 122 (*) 70 - 99 mg/dL   Comment 1 Documented in Chart     Comment 2 Notify RN    GLUCOSE, CAPILLARY     Status: None   Collection Time    05/23/13 12:09 AM       Result Value Range   Glucose-Capillary 86  70 - 99 mg/dL   Comment 1 Documented in Chart     Comment 2 Notify RN    GLUCOSE, CAPILLARY     Status: None   Collection Time    05/23/13  5:06 AM      Result Value Range   Glucose-Capillary 91  70 - 99 mg/dL  HEPARIN LEVEL (UNFRACTIONATED)     Status: None   Collection Time    05/23/13  5:20 AM      Result Value Range   Heparin Unfractionated 0.31  0.30 - 0.70 IU/mL  CBC     Status: Abnormal   Collection Time    05/23/13  5:20 AM      Result Value Range   WBC 7.6  4.0 - 10.5 K/uL  RBC 4.46  3.87 - 5.11 MIL/uL   Hemoglobin 14.9  12.0 - 15.0 g/dL   HCT 40.9  81.1 - 91.4 %   MCV 101.8 (*) 78.0 - 100.0 fL   MCH 33.4  26.0 - 34.0 pg   MCHC 32.8  30.0 - 36.0 g/dL   RDW 78.2 (*) 95.6 - 21.3 %   Platelets 155  150 - 400 K/uL  BASIC METABOLIC PANEL     Status: Abnormal   Collection Time    05/23/13  5:20 AM      Result Value Range   Sodium 138  135 - 145 mEq/L   Potassium 3.1 (*) 3.5 - 5.1 mEq/L   Chloride 98  96 - 112 mEq/L   CO2 28  19 - 32 mEq/L   Glucose, Bld 90  70 - 99 mg/dL   BUN <3 (*) 6 - 23 mg/dL   Creatinine, Ser 0.86  0.50 - 1.10 mg/dL   Calcium 9.3  8.4 - 57.8 mg/dL   GFR calc non Af Amer >90  >90 mL/min   GFR calc Af Amer >90  >90 mL/min  LACTIC ACID, PLASMA     Status: None   Collection Time    05/23/13  5:20 AM      Result Value Range   Lactic Acid, Venous 1.2  0.5 - 2.2 mmol/L  GLUCOSE, CAPILLARY     Status: None   Collection Time    05/23/13  7:46 AM      Result Value Range   Glucose-Capillary 96  70 - 99 mg/dL    Studies/Results: @RISRSLT24 @    Assessment: Abdominal pain, her symptoms are better with heparinization. It could be that she has been having intestinal ischemia that has been causing her pain since she is better now with heparinization.  Plan: Advance diet and see how she does clinically. More than likely she will need to be switched to Coumadin in the very near future    Kona Yusuf  F 05/23/2013, 11:16 AM

## 2013-05-23 NOTE — Progress Notes (Signed)
Coumadin education video on TV currently playing for patient in her room. Patient informed to ask if she has any questions or concerns about what was reviewed on the video.

## 2013-05-23 NOTE — Progress Notes (Signed)
Subjective: Less abdominal pain.  Objective: Vital signs in last 24 hours: Temp:  [98.1 F (36.7 C)-98.6 F (37 C)] 98.3 F (36.8 C) (06/06 0549) Pulse Rate:  [71-74] 71 (06/06 0549) Resp:  [16-18] 18 (06/06 0549) BP: (149-176)/(76-95) 176/82 mmHg (06/06 0549) SpO2:  [93 %-99 %] 96 % (06/06 0549) Weight:  [127 lb 14.4 oz (58.015 kg)] 127 lb 14.4 oz (58.015 kg) (06/06 0500) Last BM Date: 05/22/13  Intake/Output from previous day: 06/05 0701 - 06/06 0700 In: 420 [P.O.:420] Out: -  Intake/Output this shift:    GI: soft less tender. no rebound  mild distension.  Lab Results:   Recent Labs  05/22/13 1800 05/23/13 0520  WBC 8.4 7.6  HGB 15.1* 14.9  HCT 44.5 45.4  PLT 158 155   BMET  Recent Labs  05/22/13 0610 05/23/13 0520  NA 142 138  K 4.5 3.1*  CL 109 98  CO2 27 28  GLUCOSE 78 90  BUN 3* <3*  CREATININE 0.72 0.63  CALCIUM 8.6 9.3   PT/INR  Recent Labs  05/22/13 0610  LABPROT 14.6  INR 1.16   ABG No results found for this basename: PHART, PCO2, PO2, HCO3,  in the last 72 hours  Studies/Results: US Abdomen Complete  05/21/2013   *RADIOLOGY REPORT*  Clinical Data:  Abnormal liver function tests, history of carcinoma of the colon completed chemotherapy in 2007.  Also, history of uterine carcinoma many years ago.  COMPLETE ABDOMINAL ULTRASOUND  Comparison:  Ultrasound of the abdomen of 06/12/2011 and CT abdomen and pelvis of 03/29/2010  Findings:  Gallbladder:  The gallbladder wall is noted to be thickened measuring up to 8.1 mm diffusely.  This can be seen with acute cholecystitis, but no gallstones are seen and there is no pain over the gallbladder.  No definite pericholecystic fluid is seen.  Other considerations are that of hyperproteinemia, congestive heart failure, and cirrhosis.  Common bile duct:  The common bile duct is normal measuring 4.0 mm in diameter.  Liver:  The liver is somewhat inhomogeneous which suggests fatty infiltration.  No focal  hepatic lesion is seen and no ductal dilatation is noted.  There is a small amount of perihepatic fluid present.  IVC:  Appears normal.  Pancreas:  The pancreas is obscured by bowel gas and cannot be evaluated.  There needed  Spleen:  The spleen is enlarged measuring 12.5 cm with a volume of 414 ml.  Right Kidney:  No hydronephrosis is seen.  The right kidney measures 9.4 cm sagittally.  Left Kidney:  No hydronephrosis is noted.  The contours of the left kidney are slightly lobular, with left kidney measuring 8.9 cm.  Abdominal aorta:  The abdominal aorta appears normal in caliber proximally, but is obscured distally by bowel gas.  IMPRESSION:  1.  Inhomogeneous liver suggestive of fatty infiltration.  There is a small amount of perihepatic fluid present.  Question ascites. 2.  Diffusely thickened gallbladder wall as noted above.  No gallstones or pain over the gallbladder currently. 3.  Splenomegaly. 4.  Pancreas and portions of the abdominal aorta are obscured by bowel gas.   Original Report Authenticated By: Dwyane Dee, M.D.   Ct Abdomen Pelvis W Contrast  05/22/2013   *RADIOLOGY REPORT*  Clinical Data: Diffuse abdominal pain with diarrhea.  CT ABDOMEN AND PELVIS WITH CONTRAST  Technique:  Multidetector CT imaging of the abdomen and pelvis was performed following the standard protocol during bolus administration of intravenous contrast.  Contrast: OMNIPAQUE  IOHEXOL 300 MG/ML  SOLN  Comparison: Ultrasound 05/21/2013. 02/16/2009.  Findings: Lung Bases: Scattered areas of subpleural scarring and / or atelectasis.  Trace right small right pleural effusion layering dependently.  Esophageal varices.  Descending thoracic aortic atherosclerosis.  Liver:  Mildly heterogeneous perfusion of the liver.  No intrahepatic portal venous thrombosis.  No biliary ductal dilation. Perihepatic ascites.  Spleen:  Borderline splenomegaly.  Gallbladder:  High-density material is present within the gallbladder.  Ascites around  the gallbladder is well.  High density material may represent biliary sludge or vicarious excretion of contrast if the patient has had a other imaging.  Common bile duct:  No dilation.  No calcified common duct stone.  Pancreas:  No inflammatory changes or mass lesion.  Dystrophic calcification near the ampulla.  This is unchanged compared to 02/16/2009.  Adrenal glands:  Normal.  Kidneys:  Normal renal enhancement.  Tiny low density lesion in the interpolar left kidney probably represents a cyst but is too small to characterize definitively.  Normal renal excretion.  The ureters appear within normal limits.  Stomach:  As noted above, as noted above, esophageal varices are present.  There also gastric varices with mural thickening of the gastric fundus extending to the antrum.  This is nonspecific and may be related to portal venous hypertension.  Small bowel:  Diffuse stranding of the small bowel mesentery compatible with "misty mesentery".  Based on other findings, this is most compatible with portal venous congestion.  Colon:   Ileocolic anastomoses in the right lower quadrant. Diffuse edema of the proximal colon wall.  This is probably also secondary to portal venous and mesenteric congestion rather than inflammatory bowel disease or infection.  Pelvic Genitourinary:  Hysterectomy.  Moderate volume of ascites in the pelvis.  Urinary bladder appears normal.  Retroperitoneal collaterals are present associated with portal hypertension.  Bones:  No aggressive osseous lesions.  Lumbar spondylosis. Bilateral L5 pars defects with grade 1 anterolisthesis of L5 on S1. L4-L5 and L5-S1 predominant degenerative disease.  Vasculature: Aortoiliac atherosclerosis without aneurysm.  Portal vein thrombosis present which is nonocclusive.  This is best visualized on axial image number 31 series 2 and extends into the superior mesenteric vein.  The splenic vein and inferior mesenteric vein appear within normal limits.  Suspect  extension of thrombus into the ileocolic drainage (image number 44 series 2).  Body Wall: Grossly normal.  IMPRESSION: 1.  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 2.  Spleen upper limits of normal, probably due to portal venous hypertension and congestion. 3.  Moderate ascites. 4.  Mural thickening of the stomach and proximal colon, probably due to the mesenteric venous congestion.  No definite mesenteric venous infarct. 5.  High-density material in the gallbladder.  Reportedly the patient has had a recent CT and this is most compatible with vicarious excretion of contrast.   Original Report Authenticated By: Andreas Newport, M.D.    Anti-infectives: Anti-infectives   None      Assessment/Plan: SMV thrombosis Feels better and wants to go home.  Continue anticoagulation.  Recommend ETOH cessation.  OK to feed.  LOS: 3 days    Lachina Salsberry A. 05/23/2013

## 2013-05-23 NOTE — Consult Note (Signed)
Pt seen examined and chart reviewed.  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  On heparin.  No acute surgical need.  Will follow

## 2013-05-23 NOTE — Progress Notes (Signed)
[facility]  Subjective  -   The patient reports that she feels much better today. She feels like the swelling in her belly has decreased. She denies chest pain. She denies shortness of breath. She has not had significant oral intake   Physical Exam:   Cardiovascular: Regular rhythm Pulmonary: Respirations are nonlabored. Abdomen: She is less tender today. There is less distention of her abdomen as well. 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   Assessment/Plan:  Mesenteric venous thrombosis  The patient appears to be improving with heparinization. She shows no signs of ischemic bowel currently. There is no role for vascular surgery intervention at this time. I recommend continued anti-coagulation, which should be transitioned into the outpatient setting once she has had resolution of her abdominal pain and can tolerate a regular diet.  Marycatherine Maniscalco IV, V. WELLS 05/23/2013 1:17 PM --  Filed Vitals:   05/23/13 1109  BP: 179/94  Pulse: 66  Temp:   Resp:    No intake or output data in the 24 hours ending 05/23/13 1317   Laboratory CBC    Component Value Date/Time   WBC 7.6 05/23/2013 0520   WBC 9.5 03/23/2010 1244   HGB 14.9 05/23/2013 0520   HGB 16.3* 03/23/2010 1244   HCT 45.4 05/23/2013 0520   HCT 46.9* 03/23/2010 1244   PLT 155 05/23/2013 0520   PLT 245 03/23/2010 1244    BMET    Component Value Date/Time   NA 138 05/23/2013 0520   K 3.1* 05/23/2013 0520   CL 98 05/23/2013 0520   CO2 28 05/23/2013 0520   GLUCOSE 90 05/23/2013 0520   BUN <3* 05/23/2013 0520   CREATININE 0.63 05/23/2013 0520   CALCIUM 9.3 05/23/2013 0520   GFRNONAA >90 05/23/2013 0520   GFRAA >90 05/23/2013 0520    COAG Lab Results  Component Value Date   INR 1.16 05/22/2013   No results found for this basename: PTT    Antibiotics Anti-infectives   None       V. Charlena Cross,  M.D. [facility] Office: [phone] Pager:  [phone]

## 2013-05-24 LAB — BASIC METABOLIC PANEL
CO2: 24 mEq/L (ref 19–32)
Calcium: 9.4 mg/dL (ref 8.4–10.5)
Creatinine, Ser: 0.74 mg/dL (ref 0.50–1.10)
GFR calc non Af Amer: >90 mL/min (ref >90)
Glucose, Bld: 80 mg/dL (ref 70–99)

## 2013-05-24 LAB — GLUCOSE, CAPILLARY
Glucose-Capillary: 91 mg/dL (ref 70–99)
Glucose-Capillary: 99 mg/dL (ref 70–99)
Glucose-Capillary: 105 mg/dL — ABNORMAL HIGH (ref 70–99)

## 2013-05-24 LAB — HEPARIN LEVEL (UNFRACTIONATED): Heparin Unfractionated: 0.27 IU/mL — ABNORMAL LOW (ref 0.30–0.70)

## 2013-05-24 LAB — CBC
HCT: 44.8 % (ref 36.0–46.0)
MCHC: 33.3 g/dL (ref 30.0–36.0)
Platelets: 177 10*3/uL (ref 150–400)
RDW: 16.6 % — ABNORMAL HIGH (ref 11.5–15.5)
WBC: 7 10*3/uL (ref 4.0–10.5)

## 2013-05-24 LAB — PROTIME-INR: INR: 1.06 (ref 0.00–1.49)

## 2013-05-24 MED ORDER — WARFARIN SODIUM 5 MG PO TABS
5.0000 mg | ORAL_TABLET | Freq: Once | ORAL | Status: AC
  Administered 2013-05-24: 5 mg via ORAL
  Filled 2013-05-24: qty 1

## 2013-05-24 MED ORDER — ENOXAPARIN SODIUM 60 MG/0.6ML ~~LOC~~ SOLN: 60.0000 mg | Freq: Two times a day (BID) | SUBCUTANEOUS | Status: DC

## 2013-05-24 MED ORDER — ENOXAPARIN (LOVENOX) PATIENT EDUCATION KIT
PACK | Freq: Once | Status: AC
  Administered 2013-05-24: 12:00:00
  Filled 2013-05-24: qty 1

## 2013-05-24 MED ORDER — POTASSIUM CHLORIDE CRYS ER 20 MEQ PO TBCR
40.0000 meq | EXTENDED_RELEASE_TABLET | Freq: Once | ORAL | Status: AC
  Administered 2013-05-24: 40 meq via ORAL

## 2013-05-24 MED ORDER — ENOXAPARIN SODIUM 60 MG/0.6ML ~~LOC~~ SOLN
60.0000 mg | Freq: Two times a day (BID) | SUBCUTANEOUS | Status: DC
  Administered 2013-05-24 – 2013-05-25 (×3): 60 mg via SUBCUTANEOUS
  Filled 2013-05-24 (×4): qty 0.6

## 2013-05-24 NOTE — Progress Notes (Signed)
Pt got education on lovenox and coumadin, watched videos on lovenox injections and coumadin medication. Pt gave herself a lovenox injection at 1300 successfully. Lovenox education kit given to pt.  Peter Congo RN

## 2013-05-24 NOTE — Progress Notes (Signed)
PATIENT DETAILS Name: Laura Farley Age: 57 y.o. Sex: female Date of Birth: October 31, 1956 Admit Date: 05/20/2013 Admitting Physician Eduard Clos, MD PCP:Pcp Not In System  Subjective: Abdominal pain is significantly better- she is desperately wanting to go home today  Assessment/Plan: Principal Problem:   Abdominal pain - Secondary to superior mesenteric vein thrombosis - Abdominal pain is significantly better - She is tolerating diet - Stop heparin infusion and transition to Lovenox, continue with Coumadin per pharmacy - Patient has been requesting to go home for the past 2 days, she claims she will be able to inject herself Lovenox at home. I will ask the nursing staff to begin Lovenox education. - She will follow up with her primary care practitioner on 6/9 for INR check.  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 - Does have history of alcohol use. -An EGD done a couple of years ago showed some small esophageal varices and portal hypertensive gastropathy.    Tobacco abuse - Continue transdermal nicotine. Strongly advised patient to stop drinking.  Depression - Continue with Paxil  Disposition: Remain inpatient  DVT Prophylaxis: Not needed as on therapeutic Lovenox  Code Status: Full code   Family Communication None at bedside today. Did speak with husband a few days ago  Procedures:  None  CONSULTS:  GI, general surgery and vascular surgery   MEDICATIONS: Scheduled Meds: . amLODipine  10 mg Oral Daily  . antiseptic oral rinse  15 mL Mouth Rinse q12n4p  . chlorhexidine  15 mL Mouth Rinse BID  . enoxaparin (LOVENOX) injection  60 mg Subcutaneous Q12H  . lisinopril  40 mg Oral Daily  . nicotine  14 mg Transdermal Daily  . pantoprazole  40 mg Oral QHS  . PARoxetine  40 mg Oral Daily  . propranolol  20 mg Oral BID  .  sodium chloride  3 mL Intravenous Q12H  . spironolactone  50 mg Oral Daily  . warfarin  5 mg Oral ONCE-1800  . Warfarin - Pharmacist Dosing Inpatient   Does not apply q1800   Continuous Infusions:  PRN Meds:.acetaminophen, acetaminophen, clonazePAM, hydrALAZINE, ondansetron (ZOFRAN) IV, ondansetron, oxyCODONE  Antibiotics: Anti-infectives   None       PHYSICAL EXAM: Vital signs in last 24 hours: Filed Vitals:   05/24/13 0500 05/24/13 0526 05/24/13 1043 05/24/13 1340  BP:  131/81 142/85 125/80  Pulse:  61 73 66  Temp:  97.9 F (36.6 C)  98.4 F (36.9 C)  TempSrc:  Oral  Oral  Resp:  18  18  Height:      Weight: 57.335 kg (126 lb 6.4 oz)     SpO2:  96%  95%    Weight change: -0.68 kg (-1 lb 8 oz) Filed Weights   05/22/13 0625 05/23/13 0500 05/24/13 0500  Weight: 59.3 kg (130 lb 11.7 oz) 58.015 kg (127 lb 14.4 oz) 57.335 kg (126 lb 6.4 oz)   Body mass index is 23.9 kg/(m^2).   Gen Exam: Awake and alert with clear speech.  Neck: Supple, No JVD.   Chest: B/L Clear.   CVS: S1 S2 Regular, no murmurs.  Abdomen: soft, BS +, minimal periumbilical tenderness, non distended.  Extremities: no edema, lower extremities warm to touch. Neurologic: Non Focal.   Skin: No Rash.   Wounds: N/A.    Intake/Output from previous day:  Intake/Output Summary (Last 24 hours) at 05/24/13 1619 Last  data filed at 05/24/13 1147  Gross per 24 hour  Intake 1017.75 ml  Output      1 ml  Net 1016.75 ml     LAB RESULTS: CBC  Recent Labs Lab 05/20/13 1807 05/21/13 0600 05/22/13 0610 05/22/13 1800 05/23/13 0520 05/24/13 0547  WBC 6.1 6.0 5.5 8.4 7.6 7.0  HGB 15.1* 13.8 13.7 15.1* 14.9 14.9  HCT 43.7 41.9 41.9 44.5 45.4 44.8  PLT 186 156 139* 158 155 177  MCV 99.5 102.2* 104.2* 102.1* 101.8* 101.1*  MCH 34.4* 33.7 34.1* 34.6* 33.4 33.6  MCHC 34.6 32.9 32.7 33.9 32.8 33.3  RDW 16.8* 17.1* 16.9* 16.5* 16.6* 16.6*  LYMPHSABS 1.4 1.4  --   --   --   --   MONOABS 0.9 1.0  --   --    --   --   EOSABS 0.2 0.4  --   --   --   --   BASOSABS 0.0 0.1  --   --   --   --     Chemistries   Recent Labs Lab 05/20/13 1807 05/21/13 0600 05/22/13 0610 05/23/13 0520 05/24/13 0547  NA 141 143 142 138 139  K 3.0* 3.1* 4.5 3.1* 3.2*  CL 102 109 109 98 100  CO2 29 26 27 28 24   GLUCOSE 102* 89 78 90 80  BUN 6 5* 3* <3* 3*  CREATININE 0.75 0.76 0.72 0.63 0.74  CALCIUM 9.5 8.1* 8.6 9.3 9.4    CBG:  Recent Labs Lab 05/23/13 1956 05/24/13 0014 05/24/13 0351 05/24/13 0747 05/24/13 1212  GLUCAP 108* 93 91 80 99    GFR Estimated Creatinine Clearance: 57.8 ml/min (by C-G formula based on Cr of 0.74).  Coagulation profile  Recent Labs Lab 05/22/13 0610 05/24/13 0547  INR 1.16 1.06    Cardiac Enzymes No results found for this basename: CK, CKMB, TROPONINI, MYOGLOBIN,  in the last 168 hours  No components found with this basename: POCBNP,  No results found for this basename: DDIMER,  in the last 72 hours No results found for this basename: HGBA1C,  in the last 72 hours No results found for this basename: CHOL, HDL, LDLCALC, TRIG, CHOLHDL, LDLDIRECT,  in the last 72 hours No results found for this basename: TSH, T4TOTAL, FREET3, T3FREE, THYROIDAB,  in the last 72 hours  Recent Labs  05/23/13 0520  VITAMINB12 639   No results found for this basename: LIPASE, AMYLASE,  in the last 72 hours  Urine Studies No results found for this basename: UACOL, UAPR, USPG, UPH, UTP, UGL, UKET, UBIL, UHGB, UNIT, UROB, ULEU, UEPI, UWBC, URBC, UBAC, CAST, CRYS, UCOM, BILUA,  in the last 72 hours  MICROBIOLOGY: Recent Results (from the past 240 hour(s))  STOOL CULTURE     Status: None   Collection Time    05/21/13  3:10 PM      Result Value Range Status   Specimen Description STOOL   Final   Special Requests NONE   Final   Culture NO SUSPICIOUS COLONIES, CONTINUING TO HOLD   Final   Report Status PENDING   Incomplete  CLOSTRIDIUM DIFFICILE BY PCR     Status: None    Collection Time    05/21/13  3:10 PM      Result Value Range Status   C difficile by pcr NEGATIVE  NEGATIVE Final    RADIOLOGY STUDIES/RESULTS: US Abdomen Complete  05/21/2013   *RADIOLOGY REPORT*  Clinical Data:  Abnormal liver function tests,  history of carcinoma of the colon completed chemotherapy in 2007.  Also, history of uterine carcinoma many years ago.  COMPLETE ABDOMINAL ULTRASOUND  Comparison:  Ultrasound of the abdomen of 06/12/2011 and CT abdomen and pelvis of 03/29/2010  Findings:  Gallbladder:  The gallbladder wall is noted to be thickened measuring up to 8.1 mm diffusely.  This can be seen with acute cholecystitis, but no gallstones are seen and there is no pain over the gallbladder.  No definite pericholecystic fluid is seen.  Other considerations are that of hyperproteinemia, congestive heart failure, and cirrhosis.  Common bile duct:  The common bile duct is normal measuring 4.0 mm in diameter.  Liver:  The liver is somewhat inhomogeneous which suggests fatty infiltration.  No focal hepatic lesion is seen and no ductal dilatation is noted.  There is a small amount of perihepatic fluid present.  IVC:  Appears normal.  Pancreas:  The pancreas is obscured by bowel gas and cannot be evaluated.  There needed  Spleen:  The spleen is enlarged measuring 12.5 cm with a volume of 414 ml.  Right Kidney:  No hydronephrosis is seen.  The right kidney measures 9.4 cm sagittally.  Left Kidney:  No hydronephrosis is noted.  The contours of the left kidney are slightly lobular, with left kidney measuring 8.9 cm.  Abdominal aorta:  The abdominal aorta appears normal in caliber proximally, but is obscured distally by bowel gas.  IMPRESSION:  1.  Inhomogeneous liver suggestive of fatty infiltration.  There is a small amount of perihepatic fluid present.  Question ascites. 2.  Diffusely thickened gallbladder wall as noted above.  No gallstones or pain over the gallbladder currently. 3.  Splenomegaly. 4.   Pancreas and portions of the abdominal aorta are obscured by bowel gas.   Original Report Authenticated By: Dwyane Dee, M.D.   Ct Abdomen Pelvis W Contrast  05/22/2013   *RADIOLOGY REPORT*  Clinical Data: Diffuse abdominal pain with diarrhea.  CT ABDOMEN AND PELVIS WITH CONTRAST  Technique:  Multidetector CT imaging of the abdomen and pelvis was performed following the standard protocol during bolus administration of intravenous contrast.  Contrast: OMNIPAQUE IOHEXOL 300 MG/ML  SOLN  Comparison: Ultrasound 05/21/2013. 02/16/2009.  Findings: Lung Bases: Scattered areas of subpleural scarring and / or atelectasis.  Trace right small right pleural effusion layering dependently.  Esophageal varices.  Descending thoracic aortic atherosclerosis.  Liver:  Mildly heterogeneous perfusion of the liver.  No intrahepatic portal venous thrombosis.  No biliary ductal dilation. Perihepatic ascites.  Spleen:  Borderline splenomegaly.  Gallbladder:  High-density material is present within the gallbladder.  Ascites around the gallbladder is well.  High density material may represent biliary sludge or vicarious excretion of contrast if the patient has had a other imaging.  Common bile duct:  No dilation.  No calcified common duct stone.  Pancreas:  No inflammatory changes or mass lesion.  Dystrophic calcification near the ampulla.  This is unchanged compared to 02/16/2009.  Adrenal glands:  Normal.  Kidneys:  Normal renal enhancement.  Tiny low density lesion in the interpolar left kidney probably represents a cyst but is too small to characterize definitively.  Normal renal excretion.  The ureters appear within normal limits.  Stomach:  As noted above, as noted above, esophageal varices are present.  There also gastric varices with mural thickening of the gastric fundus extending to the antrum.  This is nonspecific and may be related to portal venous hypertension.  Small bowel:  Diffuse stranding  of the small bowel mesentery  compatible with "misty mesentery".  Based on other findings, this is most compatible with portal venous congestion.  Colon:   Ileocolic anastomoses in the right lower quadrant. Diffuse edema of the proximal colon wall.  This is probably also secondary to portal venous and mesenteric congestion rather than inflammatory bowel disease or infection.  Pelvic Genitourinary:  Hysterectomy.  Moderate volume of ascites in the pelvis.  Urinary bladder appears normal.  Retroperitoneal collaterals are present associated with portal hypertension.  Bones:  No aggressive osseous lesions.  Lumbar spondylosis. Bilateral L5 pars defects with grade 1 anterolisthesis of L5 on S1. L4-L5 and L5-S1 predominant degenerative disease.  Vasculature: Aortoiliac atherosclerosis without aneurysm.  Portal vein thrombosis present which is nonocclusive.  This is best visualized on axial image number 31 series 2 and extends into the superior mesenteric vein.  The splenic vein and inferior mesenteric vein appear within normal limits.  Suspect extension of thrombus into the ileocolic drainage (image number 44 series 2).  Body Wall: Grossly normal.  IMPRESSION: 1.  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 2.  Spleen upper limits of normal, probably due to portal venous hypertension and congestion. 3.  Moderate ascites. 4.  Mural thickening of the stomach and proximal colon, probably due to the mesenteric venous congestion.  No definite mesenteric venous infarct. 5.  High-density material in the gallbladder.  Reportedly the patient has had a recent CT and this is most compatible with vicarious excretion of contrast.   Original Report Authenticated By: Andreas Newport, M.D.    Jeoffrey Massed, MD  [facility] Pager:[phone]  If 7PM-7AM, please contact night-coverage [email or web address] Password [email or web address] 05/24/2013, 4:19 PM   LOS: 4 days

## 2013-05-24 NOTE — Progress Notes (Signed)
05/24/2013 1645 NCM contacted [facility] on [address] and they do not have Lovenox in stock . Contacted pt and she can use [facility] on [address]. Contacted [facility] and they do have in stock. Faxed Lovenox Rx with facesheet to check for copay price. Will fax additional Rx's in am to [facility]. Isidoro Donning RN CCM Case Mgmt phone [phone]

## 2013-05-24 NOTE — Progress Notes (Signed)
Subjective: Denies abdominal pain.  Objective: Vital signs in last 24 hours: Temp:  [97.8 F (36.6 C)-98.2 F (36.8 C)] 97.9 F (36.6 C) (06/07 0526) Pulse Rate:  [56-73] 73 (06/07 1043) Resp:  [18-20] 18 (06/07 0526) BP: (131-144)/(81-89) 142/85 mmHg (06/07 1043) SpO2:  [94 %-96 %] 96 % (06/07 0526) Weight:  [57.335 kg (126 lb 6.4 oz)] 57.335 kg (126 lb 6.4 oz) (06/07 0500) Weight change: -0.68 kg (-1 lb 8 oz) Last BM Date: 05/22/13  PE: GEN:  NAD, chronically ill-appearing, appears older than stated age SKIN:  Scattered ecchymoses  Assessment:  1.  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  Etiology is unclear, perhaps prothrombotic from prior history of cancer.  Denies prior history of pancreatitis.  Plan:  1.  Continue heparin, until INR therapeutic from warfarin. 2.  Patient will need hematology consultation (has seen Dr. Dalene Carrow in the past), specifically to help determine cause of her PV/SMV thrombosis and to help determine total duration of anticoagulation needed.  This can be done as inpatient or outpatient. 3.  Will sign-off; please call with questions; thank you for the consultation.   Freddy Jaksch 05/24/2013, 11:09 AM

## 2013-05-24 NOTE — Progress Notes (Signed)
Abdominal pain  Assessment: SMV thrombosis, clinically pimproving  Plan: No surgical indications at this time. We will see again PRN   Subjective: No pain, tolerating diet and wants to go home  Objective: Vital signs in last 24 hours: Temp:  [97.8 F (36.6 C)-98.2 F (36.8 C)] 97.9 F (36.6 C) (06/07 0526) Pulse Rate:  [56-66] 61 (06/07 0526) Resp:  [18-20] 18 (06/07 0526) BP: (131-179)/(81-94) 131/81 mmHg (06/07 0526) SpO2:  [94 %-96 %] 96 % (06/07 0526) Weight:  [126 lb 6.4 oz (57.335 kg)] 126 lb 6.4 oz (57.335 kg) (06/07 0500) Last BM Date: 05/22/13  Intake/Output from previous day: 06/06 0701 - 06/07 0700 In: 240 [P.O.:240] Out: -   General appearance: alert, cooperative and no distress GI: soft, non-tender; bowel sounds normal; no masses,  no organomegaly  Lab Results:  Results for orders placed during the hospital encounter of 05/20/13 (from the past 24 hour(s))  GLUCOSE, CAPILLARY     Status: None   Collection Time    05/23/13 12:01 PM      Result Value Range   Glucose-Capillary 93  70 - 99 mg/dL  ANTITHROMBIN III     Status: Abnormal   Collection Time    05/23/13  2:00 PM      Result Value Range   AntiThromb III Func 56 (*) 75 - 120 %  HOMOCYSTEINE     Status: Abnormal   Collection Time    05/23/13  2:00 PM      Result Value Range   Homocysteine 25.6 (*) 4.0 - 15.4 umol/L  GLUCOSE, CAPILLARY     Status: None   Collection Time    05/23/13  4:41 PM      Result Value Range   Glucose-Capillary 92  70 - 99 mg/dL  GLUCOSE, CAPILLARY     Status: Abnormal   Collection Time    05/23/13  7:56 PM      Result Value Range   Glucose-Capillary 108 (*) 70 - 99 mg/dL   Comment 1 Notify RN     Comment 2 Documented in Chart    GLUCOSE, CAPILLARY     Status: None   Collection Time    05/24/13 12:14 AM      Result Value Range   Glucose-Capillary 93  70 - 99 mg/dL   Comment 1 Notify RN    GLUCOSE, CAPILLARY     Status: None   Collection Time    05/24/13  3:51  AM      Result Value Range   Glucose-Capillary 91  70 - 99 mg/dL   Comment 1 Notify RN     Comment 2 Documented in Chart    HEPARIN LEVEL (UNFRACTIONATED)     Status: Abnormal   Collection Time    05/24/13  5:47 AM      Result Value Range   Heparin Unfractionated 0.27 (*) 0.30 - 0.70 IU/mL  CBC     Status: Abnormal   Collection Time    05/24/13  5:47 AM      Result Value Range   WBC 7.0  4.0 - 10.5 K/uL   RBC 4.43  3.87 - 5.11 MIL/uL   Hemoglobin 14.9  12.0 - 15.0 g/dL   HCT 16.1  09.6 - 04.5 %   MCV 101.1 (*) 78.0 - 100.0 fL   MCH 33.6  26.0 - 34.0 pg   MCHC 33.3  30.0 - 36.0 g/dL   RDW 40.9 (*) 81.1 - 91.4 %   Platelets  177  150 - 400 K/uL  PROTIME-INR     Status: None   Collection Time    05/24/13  5:47 AM      Result Value Range   Prothrombin Time 13.7  11.6 - 15.2 seconds   INR 1.06  0.00 - 1.49  BASIC METABOLIC PANEL     Status: Abnormal   Collection Time    05/24/13  5:47 AM      Result Value Range   Sodium 139  135 - 145 mEq/L   Potassium 3.2 (*) 3.5 - 5.1 mEq/L   Chloride 100  96 - 112 mEq/L   CO2 24  19 - 32 mEq/L   Glucose, Bld 80  70 - 99 mg/dL   BUN 3 (*) 6 - 23 mg/dL   Creatinine, Ser 1.61  0.50 - 1.10 mg/dL   Calcium 9.4  8.4 - 09.6 mg/dL   GFR calc non Af Amer >90  >90 mL/min   GFR calc Af Amer >90  >90 mL/min  GLUCOSE, CAPILLARY     Status: None   Collection Time    05/24/13  7:47 AM      Result Value Range   Glucose-Capillary 80  70 - 99 mg/dL     Studies/Results Radiology     MEDS, Scheduled . amLODipine  10 mg Oral Daily  . antiseptic oral rinse  15 mL Mouth Rinse q12n4p  . chlorhexidine  15 mL Mouth Rinse BID  . lisinopril  40 mg Oral Daily  . nicotine  14 mg Transdermal Daily  . pantoprazole  40 mg Oral QHS  . PARoxetine  40 mg Oral Daily  . propranolol  20 mg Oral BID  . sodium chloride  3 mL Intravenous Q12H  . spironolactone  50 mg Oral Daily  . Warfarin - Pharmacist Dosing Inpatient   Does not apply q1800       LOS: 4  days    Currie Paris, MD, [facility], [place] [phone]   05/24/2013 9:05 AM

## 2013-05-24 NOTE — Progress Notes (Signed)
Pt ambulated in hall 2 times today 450 ft each time. Tolerated well.  Peter Congo RN

## 2013-05-24 NOTE — ED Provider Notes (Signed)
Medical screening examination/treatment/procedure(s) were performed by non-physician practitioner and as supervising physician I was immediately available for consultation/collaboration.   Joya Gaskins, MD 05/24/13 [phone]

## 2013-05-24 NOTE — Progress Notes (Signed)
ANTICOAGULATION CONSULT NOTE - Follow Up Consult  Pharmacy Consult for heparin Indication: mesenteric thrombosis  Labs:  Recent Labs  05/22/13 0610  05/22/13 1800 05/23/13 0520 05/24/13 0547  HGB 13.7  --  15.1* 14.9 14.9  HCT 41.9  --  44.5 45.4 44.8  PLT 139*  --  158 155 177  LABPROT 14.6  --   --   --  13.7  INR 1.16  --   --   --  1.06  HEPARINUNFRC  --   < > 0.41 0.31 0.27*  CREATININE 0.72  --   --  0.63  --   < > = values in this interval not displayed.   Assessment: 57yo female now subtherapeutic on heparin despite rate increase for level trending down yesterday.  Goal of Therapy:  Heparin level 0.3-0.7 units/ml   Plan:  Will increase heparin gtt by 1 unit/kg/hr to 1100 unit/hr and check level in 6hr.  Vernard Gambles, PharmD, BCPS  05/24/2013,6:52 AM

## 2013-05-24 NOTE — Progress Notes (Signed)
ANTICOAGULATION CONSULT NOTE  Pharmacy Consult for heparin  SWITCH to LOVENOX / Coumadin  Indication: mesenteric thrombosis  Allergies  Allergen Reactions  . Venlafaxine Hcl Other (See Comments)    Shortness of breath, irritable and mean    Patient Measurements: Height: 5\' 1"  (154.9 cm) Weight: 126 lb 6.4 oz (57.335 kg) IBW/kg (Calculated) : 47.8 Heparin Dosing Weight: 47.5 kg  Vital Signs: Temp: 97.9 F (36.6 C) (06/07 0526) Temp src: Oral (06/07 0526) BP: 142/85 mmHg (06/07 1043) Pulse Rate: 73 (06/07 1043)  Labs:  Recent Labs  05/22/13 0610  05/22/13 1800 05/23/13 0520 05/24/13 0547  HGB 13.7  --  15.1* 14.9 14.9  HCT 41.9  --  44.5 45.4 44.8  PLT 139*  --  158 155 177  LABPROT 14.6  --   --   --  13.7  INR 1.16  --   --   --  1.06  HEPARINUNFRC  --   < > 0.41 0.31 0.27*  CREATININE 0.72  --   --  0.63 0.74  < > = values in this interval not displayed.  Estimated Creatinine Clearance: 57.8 ml/min (by C-G formula based on Cr of 0.74).   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  MD wants IV heparin switched to Lovenox bridge until coumadin therapeutic.  Today is Day #2/5 overlap for VTE treatment protocol.  Today the INR is 1.06 after 1st day of coumadin.  PLTC table, and Hgb 14.9 stable.   No bleeding noted.      57 yo female, weight = 58 kg, MSV thrombus-->warfarin points score=3   Goal of Therapy:  4hr antiXa level = 0.6-1.2 units/ml  INR = 2-3 Monitor platelets by anticoagulation protocol: Yes    Plan:  Discontinue IV heparin drip now.  1 hr after heparin drip stopped, start Lovenox 60 mg SQ q12h (~1mg /kg q12H) Give Coumadin 5 mg today x1  PT/INR daily and monitor CBC q72hr  Noah Delaine, RPh Clinical Pharmacist Pager: [phone] 05/24/2013

## 2013-05-25 LAB — CBC
HCT: 46.4 % — ABNORMAL HIGH (ref 36.0–46.0)
MCHC: 33.6 g/dL (ref 30.0–36.0)
MCV: 99.8 fL (ref 78.0–100.0)
Platelets: 200 10*3/uL (ref 150–400)
RDW: 16.6 % — ABNORMAL HIGH (ref 11.5–15.5)
WBC: 6 10*3/uL (ref 4.0–10.5)

## 2013-05-25 LAB — GLUCOSE, CAPILLARY
Glucose-Capillary: 101 mg/dL — ABNORMAL HIGH (ref 70–99)
Glucose-Capillary: 81 mg/dL (ref 70–99)

## 2013-05-25 LAB — STOOL CULTURE

## 2013-05-25 MED ORDER — WARFARIN SODIUM 5 MG PO TABS: 5.0000 mg | ORAL_TABLET | Freq: Every day | ORAL | Status: DC

## 2013-05-25 MED ORDER — PROPRANOLOL HCL 20 MG PO TABS: 20.0000 mg | ORAL_TABLET | Freq: Two times a day (BID) | ORAL | Status: AC

## 2013-05-25 MED ORDER — AMLODIPINE BESYLATE 10 MG PO TABS: 10.0000 mg | ORAL_TABLET | Freq: Every day | ORAL | Status: DC

## 2013-05-25 MED ORDER — OXYCODONE HCL 5 MG PO TABS: 5.0000 mg | ORAL_TABLET | Freq: Four times a day (QID) | ORAL | Status: DC | PRN

## 2013-05-25 MED ORDER — SPIRONOLACTONE 50 MG PO TABS: 50.0000 mg | ORAL_TABLET | Freq: Every day | ORAL | Status: AC

## 2013-05-25 MED ORDER — ENOXAPARIN SODIUM 60 MG/0.6ML ~~LOC~~ SOLN: 60.0000 mg | Freq: Two times a day (BID) | SUBCUTANEOUS | Status: DC

## 2013-05-25 MED ORDER — NICOTINE 14 MG/24HR TD PT24: 1.0000 | MEDICATED_PATCH | Freq: Every day | TRANSDERMAL | Status: DC

## 2013-05-25 NOTE — Progress Notes (Signed)
Laura Farley to be D/C'd Home per MD order.  Discussed with the patient and all questions fully answered.    Medication List    STOP taking these medications       lisinopril-hydrochlorothiazide 20-25 MG per tablet  Commonly known as:  PRINZIDE,ZESTORETIC     naproxen sodium 220 MG tablet  Commonly known as:  ANAPROX      TAKE these medications       amLODipine 10 MG tablet  Commonly known as:  NORVASC  Take 1 tablet (10 mg total) by mouth daily.     CALCIUM + D PO  Take 1 tablet by mouth every evening.     clonazePAM 2 MG tablet  Commonly known as:  KLONOPIN  Take 2 mg by mouth 2 (two) times daily as needed.     enoxaparin 60 MG/0.6ML injection  Commonly known as:  LOVENOX  Inject 0.6 mLs (60 mg total) into the skin every 12 (twelve) hours.     lisinopril 40 MG tablet  Commonly known as:  PRINIVIL,ZESTRIL  Take 40 mg by mouth daily.     multivitamin with minerals Tabs  Take 1 tablet by mouth every evening.     nicotine 14 mg/24hr patch  Commonly known as:  NICODERM CQ - dosed in mg/24 hours  Place 1 patch onto the skin daily.     oxyCODONE 5 MG immediate release tablet  Commonly known as:  Oxy IR/ROXICODONE  Take 1 tablet (5 mg total) by mouth every 6 (six) hours as needed for pain.     PARoxetine 40 MG tablet  Commonly known as:  PAXIL  Take 40 mg by mouth every morning.     propranolol 20 MG tablet  Commonly known as:  INDERAL  Take 1 tablet (20 mg total) by mouth 2 (two) times daily.     PROTONIX 40 MG tablet  Generic drug:  pantoprazole  Take 40 mg by mouth daily.     spironolactone 50 MG tablet  Commonly known as:  ALDACTONE  Take 1 tablet (50 mg total) by mouth daily.     warfarin 5 MG tablet  Commonly known as:  COUMADIN  Take 1 tablet (5 mg total) by mouth daily.        VVS, Skin clean, dry and intact without evidence of skin break down, no evidence of skin tears noted. IV catheter discontinued intact. Site without signs and symptoms  of complications. Dressing and pressure applied.  An After Visit Summary was printed and given to the patient. Follow up appointments , new prescriptions and medication administration times given. Pt injected self with lovenox successfully. Gave pt handouts and teachback on coumadin, lovenox, smoking cessation. All questions answered. Patient escorted via WC, and D/C home via private auto.  Cindra Eves, RN 05/25/2013 11:29 AM

## 2013-05-25 NOTE — Discharge Summary (Signed)
PATIENT DETAILS Name: Laura Farley Age: 57 y.o. Sex: female Date of Birth: 01/31/1956 MRN: [id number]. Admit Date: 05/20/2013 Admitting Physician: Eduard Clos, MD PCP:Pcp Not In System  Recommendations for Outpatient Follow-up:  1. Please check PT INR at next visit 2. Please refer to hematology/oncology to determine if further workup of superior mesenteric vein/portal vein thrombosis is needed.  PRIMARY DISCHARGE DIAGNOSIS:  Principal Problem:   Abdominal pain Active Problems:   Colon cancer   Alcohol abuse   Tobacco abuse   Pancreatitis   Mesenteric vein thrombosis   Ascites      PAST MEDICAL HISTORY: Past Medical History  Diagnosis Date  . Hypertension   . Tobacco use disorder   . Seizures     withdraw from xanax  . Cancer     Right colon cancer T3  . Colon cancer   . Neuromuscular disorder   . Colon cancer   . Uterine cancer     DISCHARGE MEDICATIONS:   Medication List    STOP taking these medications       lisinopril-hydrochlorothiazide 20-25 MG per tablet  Commonly known as:  PRINZIDE,ZESTORETIC     naproxen sodium 220 MG tablet  Commonly known as:  ANAPROX      TAKE these medications       amLODipine 10 MG tablet  Commonly known as:  NORVASC  Take 1 tablet (10 mg total) by mouth daily.     CALCIUM + D PO  Take 1 tablet by mouth every evening.     clonazePAM 2 MG tablet  Commonly known as:  KLONOPIN  Take 2 mg by mouth 2 (two) times daily as needed.     enoxaparin 60 MG/0.6ML injection  Commonly known as:  LOVENOX  Inject 0.6 mLs (60 mg total) into the skin every 12 (twelve) hours.     lisinopril 40 MG tablet  Commonly known as:  PRINIVIL,ZESTRIL  Take 40 mg by mouth daily.     multivitamin with minerals Tabs  Take 1 tablet by mouth every evening.     nicotine 14 mg/24hr patch  Commonly known as:  NICODERM CQ - dosed in mg/24 hours  Place 1 patch onto the skin daily.     oxyCODONE 5 MG immediate release tablet  Commonly  known as:  Oxy IR/ROXICODONE  Take 1 tablet (5 mg total) by mouth every 6 (six) hours as needed for pain.     PARoxetine 40 MG tablet  Commonly known as:  PAXIL  Take 40 mg by mouth every morning.     propranolol 20 MG tablet  Commonly known as:  INDERAL  Take 1 tablet (20 mg total) by mouth 2 (two) times daily.     PROTONIX 40 MG tablet  Generic drug:  pantoprazole  Take 40 mg by mouth daily.     spironolactone 50 MG tablet  Commonly known as:  ALDACTONE  Take 1 tablet (50 mg total) by mouth daily.     warfarin 5 MG tablet  Commonly known as:  COUMADIN  Take 1 tablet (5 mg total) by mouth daily.        ALLERGIES:   Allergies  Allergen Reactions  . Venlafaxine Hcl Other (See Comments)    Shortness of breath, irritable and mean    BRIEF HPI:  See H&P, Labs, Consult and Test reports for all details in brief, Laura Farley is a 57 y.o. female with a history of colon cancer status post right-sided hemicolectomy in 2005  and history of esophageal varices, hypertension tobacco and alcohol abuse presented the ER because of abdominal pain.Patient had gone to her PCP who had ordered a CT abdomen and pelvis which was done in [place]. CT abdomen and pelvis as per the PCP showed superior mesenteric vein nonocclusive thrombosis and patient was referred to the ER  CONSULTATIONS:   GI, general surgery and vascular surgery  PERTINENT RADIOLOGIC STUDIES: US Abdomen Complete  05/21/2013   *RADIOLOGY REPORT*  Clinical Data:  Abnormal liver function tests, history of carcinoma of the colon completed chemotherapy in 2007.  Also, history of uterine carcinoma many years ago.  COMPLETE ABDOMINAL ULTRASOUND  Comparison:  Ultrasound of the abdomen of 06/12/2011 and CT abdomen and pelvis of 03/29/2010  Findings:  Gallbladder:  The gallbladder wall is noted to be thickened measuring up to 8.1 mm diffusely.  This can be seen with acute cholecystitis, but no gallstones are seen and there is no pain  over the gallbladder.  No definite pericholecystic fluid is seen.  Other considerations are that of hyperproteinemia, congestive heart failure, and cirrhosis.  Common bile duct:  The common bile duct is normal measuring 4.0 mm in diameter.  Liver:  The liver is somewhat inhomogeneous which suggests fatty infiltration.  No focal hepatic lesion is seen and no ductal dilatation is noted.  There is a small amount of perihepatic fluid present.  IVC:  Appears normal.  Pancreas:  The pancreas is obscured by bowel gas and cannot be evaluated.  There needed  Spleen:  The spleen is enlarged measuring 12.5 cm with a volume of 414 ml.  Right Kidney:  No hydronephrosis is seen.  The right kidney measures 9.4 cm sagittally.  Left Kidney:  No hydronephrosis is noted.  The contours of the left kidney are slightly lobular, with left kidney measuring 8.9 cm.  Abdominal aorta:  The abdominal aorta appears normal in caliber proximally, but is obscured distally by bowel gas.  IMPRESSION:  1.  Inhomogeneous liver suggestive of fatty infiltration.  There is a small amount of perihepatic fluid present.  Question ascites. 2.  Diffusely thickened gallbladder wall as noted above.  No gallstones or pain over the gallbladder currently. 3.  Splenomegaly. 4.  Pancreas and portions of the abdominal aorta are obscured by bowel gas.   Original Report Authenticated By: Dwyane Dee, M.D.   Ct Abdomen Pelvis W Contrast  05/22/2013   *RADIOLOGY REPORT*  Clinical Data: Diffuse abdominal pain with diarrhea.  CT ABDOMEN AND PELVIS WITH CONTRAST  Technique:  Multidetector CT imaging of the abdomen and pelvis was performed following the standard protocol during bolus administration of intravenous contrast.  Contrast: OMNIPAQUE IOHEXOL 300 MG/ML  SOLN  Comparison: Ultrasound 05/21/2013. 02/16/2009.  Findings: Lung Bases: Scattered areas of subpleural scarring and / or atelectasis.  Trace right small right pleural effusion layering dependently.   Esophageal varices.  Descending thoracic aortic atherosclerosis.  Liver:  Mildly heterogeneous perfusion of the liver.  No intrahepatic portal venous thrombosis.  No biliary ductal dilation. Perihepatic ascites.  Spleen:  Borderline splenomegaly.  Gallbladder:  High-density material is present within the gallbladder.  Ascites around the gallbladder is well.  High density material may represent biliary sludge or vicarious excretion of contrast if the patient has had a other imaging.  Common bile duct:  No dilation.  No calcified common duct stone.  Pancreas:  No inflammatory changes or mass lesion.  Dystrophic calcification near the ampulla.  This is unchanged compared to 02/16/2009.  Adrenal  glands:  Normal.  Kidneys:  Normal renal enhancement.  Tiny low density lesion in the interpolar left kidney probably represents a cyst but is too small to characterize definitively.  Normal renal excretion.  The ureters appear within normal limits.  Stomach:  As noted above, as noted above, esophageal varices are present.  There also gastric varices with mural thickening of the gastric fundus extending to the antrum.  This is nonspecific and may be related to portal venous hypertension.  Small bowel:  Diffuse stranding of the small bowel mesentery compatible with "misty mesentery".  Based on other findings, this is most compatible with portal venous congestion.  Colon:   Ileocolic anastomoses in the right lower quadrant. Diffuse edema of the proximal colon wall.  This is probably also secondary to portal venous and mesenteric congestion rather than inflammatory bowel disease or infection.  Pelvic Genitourinary:  Hysterectomy.  Moderate volume of ascites in the pelvis.  Urinary bladder appears normal.  Retroperitoneal collaterals are present associated with portal hypertension.  Bones:  No aggressive osseous lesions.  Lumbar spondylosis. Bilateral L5 pars defects with grade 1 anterolisthesis of L5 on S1. L4-L5 and L5-S1  predominant degenerative disease.  Vasculature: Aortoiliac atherosclerosis without aneurysm.  Portal vein thrombosis present which is nonocclusive.  This is best visualized on axial image number 31 series 2 and extends into the superior mesenteric vein.  The splenic vein and inferior mesenteric vein appear within normal limits.  Suspect extension of thrombus into the ileocolic drainage (image number 44 series 2).  Body Wall: Grossly normal.  IMPRESSION: 1.  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 2.  Spleen upper limits of normal, probably due to portal venous hypertension and congestion. 3.  Moderate ascites. 4.  Mural thickening of the stomach and proximal colon, probably due to the mesenteric venous congestion.  No definite mesenteric venous infarct. 5.  High-density material in the gallbladder.  Reportedly the patient has had a recent CT and this is most compatible with vicarious excretion of contrast.   Original Report Authenticated By: Andreas Newport, M.D.     PERTINENT LAB RESULTS: CBC:  Recent Labs  05/24/13 0547 05/25/13 0445  WBC 7.0 6.0  HGB 14.9 15.6*  HCT 44.8 46.4*  PLT 177 200   CMET CMP     Component Value Date/Time   NA 139 05/24/2013 0547   K 3.2* 05/24/2013 0547   CL 100 05/24/2013 0547   CO2 24 05/24/2013 0547   GLUCOSE 80 05/24/2013 0547   BUN 3* 05/24/2013 0547   CREATININE 0.74 05/24/2013 0547   CALCIUM 9.4 05/24/2013 0547   PROT 5.9* 05/22/2013 0610   ALBUMIN 3.1* 05/22/2013 0610   AST 40* 05/22/2013 0610   ALT 28 05/22/2013 0610   ALKPHOS 165* 05/22/2013 0610   BILITOT 0.9 05/22/2013 0610   GFRNONAA >90 05/24/2013 0547   GFRAA >90 05/24/2013 0547    GFR Estimated Creatinine Clearance: 57.8 ml/min (by C-G formula based on Cr of 0.74). No results found for this basename: LIPASE, AMYLASE,  in the last 72 hours No results found for this basename: CKTOTAL, CKMB, CKMBINDEX, TROPONINI,  in the last 72 hours No components found with this basename: POCBNP,  No  results found for this basename: DDIMER,  in the last 72 hours No results found for this basename: HGBA1C,  in the last 72 hours No results found for this basename: CHOL, HDL, LDLCALC, TRIG, CHOLHDL, LDLDIRECT,  in the last 72 hours No results found for this  basename: TSH, T4TOTAL, FREET3, T3FREE, THYROIDAB,  in the last 72 hours  Recent Labs  05/23/13 0520  VITAMINB12 639   Coags:  Recent Labs  05/24/13 0547 05/25/13 0445  INR 1.06 1.30   Microbiology: Recent Results (from the past 240 hour(s))  STOOL CULTURE     Status: None   Collection Time    05/21/13  3:10 PM      Result Value Range Status   Specimen Description STOOL   Final   Special Requests NONE   Final   Culture NO SUSPICIOUS COLONIES, CONTINUING TO HOLD   Final   Report Status PENDING   Incomplete  CLOSTRIDIUM DIFFICILE BY PCR     Status: None   Collection Time    05/21/13  3:10 PM      Result Value Range Status   C difficile by pcr NEGATIVE  NEGATIVE Final     BRIEF HOSPITAL COURSE:   Principal Problem: Abdominal pain  - Secondary to superior mesenteric vein thrombosis - Patient was admitted, on admission she did have mild lipase elevation, however her symptoms were not really consistent with pancreatitis. It is thought that her abdominal pain was probably more related to superior mesenteric vein thrombosis. Initially patient was made n.p.o., and was provided supportive care. After consultation with gastroenterology, and heparin infusion was started. A repeat CT of the abdomen was done as the patient continued to have abdominal pain, it did show new mural thickening of the stomach and the proximal colon that was not present in the prior CT. General surgery and vascular surgery was consulted, and they recommended continued conservative treatment with anticoagulation. Thankfully, the patient clinically started to improve. Her diet was advanced and now she is tolerating a regular diet. Her pain is significantly  better, initially on admission she required IV Dilaudid, and now she is on when necessary oxycodone. This morning her belly is very soft and only very minimally tender in the right mid abdominal area. There is no rebound or rigidity at all. She is considered stable to be discharged home, she will be on overlapping Lovenox and Coumadin. A followup appointment with her primary care practitioner has been made for 6/9.  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 - Please see above discussion, patient currently is on overlapping Lovenox and Coumadin, today is day 3 of overlap. - INR has slowly increased to 1.30, we will discharge her on 5 mg of Coumadin, she will also be on 60 mg of Lovenox twice a day. She's had extensive Lovenox teaching done by the RN, and she feels confident that she will be able to continue the shots at home. - I've had a long session with the patient yesterday and today as well, importance of anticoagulation was explained to the patient great detail. Bleeding side effects have also been discussed with the patient, she has been told to seek immediate medical attention in case of any bleeding complications or major trauma. She claims understanding. - Since the etiology of her portal vein and superior mesenteric vein thrombosis is unclear, she would need evaluation by hematology/oncology, she used to previously she Dr. Dalene Carrow at the cancer center, I have asked her to call the cancer Center over the next few days and get a appointment with a hematologist/oncologist. She is very familiar with the cancer center as she has had numerous appointments there in the  past, and she reassures me that she can easily get an appointment.  Suspected liver cirrhosis with ascites  - Started on Aldactone, low-salt diet.  - Does have history of alcohol use.  -An EGD done a couple of years  ago showed some small esophageal varices and portal hypertensive gastropathy.  Hypertension  - Controlled with Inderal, lisinopril, Norvasc and Aldactone   Tobacco abuse  - Continue transdermal nicotine. Strongly advised patient to stop smoking. She claims that she will continue not to smoke from now on, and is asking for transdermal nicotine. 28 patches have been provided to the patient.   Depression  - Continue with Paxil    TODAY-DAY OF DISCHARGE:  Subjective:   Niva Murren today has no headache,no chest abdominal pain,no new weakness tingling or numbness, feels much better wants to go home today.   Objective:   Blood pressure 143/82, pulse 56, temperature 97.6 F (36.4 C), temperature source Oral, resp. rate 18, height 5\' 1"  (1.549 m), weight 56.881 kg (125 lb 6.4 oz), SpO2 100.00%.  Intake/Output Summary (Last 24 hours) at 05/25/13 0836 Last data filed at 05/25/13 0700  Gross per 24 hour  Intake 780.75 ml  Output      2 ml  Net 778.75 ml   Filed Weights   05/23/13 0500 05/24/13 0500 05/25/13 0435  Weight: 58.015 kg (127 lb 14.4 oz) 57.335 kg (126 lb 6.4 oz) 56.881 kg (125 lb 6.4 oz)    Exam Awake Alert, Oriented *3, No new F.N deficits, Normal affect [place].AT,PERRAL Supple Neck,No JVD, No cervical lymphadenopathy appriciated.  Symmetrical Chest wall movement, Good air movement bilaterally, CTAB RRR,No Gallops,Rubs or new Murmurs, No Parasternal Heave +ve B.Sounds, Abd Soft, Non tender, No organomegaly appriciated, No rebound -guarding or rigidity. No Cyanosis, Clubbing or edema, No new Rash or bruise  DISCHARGE CONDITION: Stable  DISPOSITION: Home  DISCHARGE INSTRUCTIONS:    Activity:  As tolerated   Diet recommendation: Heart Healthy diet      Discharge Orders   Future Orders Complete By Expires     Call MD for:  persistant nausea and vomiting  As directed     Call MD for:  severe uncontrolled pain  As directed     Diet - low sodium heart healthy   As directed     Increase activity slowly  As directed        Follow-up Information   Follow up with Iona Hansen, NP On 05/26/2013. (1:45  for pt /inr and physical exam. )    Contact information:   [address] [place] [address] [phone]       Follow up with ODOGWU,LAURETTA I., MD. Schedule an appointment as soon as possible for a visit in 1 week.   Contact information:   [address] [place] [address] [phone]      Total Time spent on discharge equals 45 minutes.  SignedJeoffrey Massed 05/25/2013 8:36 AM

## 2013-05-26 LAB — PROTEIN S ACTIVITY: Protein S Activity: 74 % (ref 69–129)

## 2013-05-26 LAB — CARDIOLIPIN ANTIBODIES, IGG, IGM, IGA
Anticardiolipin IgA: 8 APL U/mL — ABNORMAL LOW (ref <22)
Anticardiolipin IgG: 4 GPL U/mL — ABNORMAL LOW (ref <23)
Anticardiolipin IgM: 12 MPL U/mL — ABNORMAL HIGH (ref <11)

## 2013-05-26 LAB — LUPUS ANTICOAGULANT PANEL
DRVVT: 25.4 secs (ref <42.9)
Lupus Anticoagulant: NOT DETECTED
PTTLA 4:1 Mix: 50 secs — ABNORMAL HIGH (ref 28.0–43.0)
PTTLA Confirmation: 0 secs (ref <8.0)

## 2013-05-26 LAB — BETA-2-GLYCOPROTEIN I ABS, IGG/M/A: Beta-2-Glycoprotein I IgA: 3 A Units (ref <20)

## 2013-05-27 LAB — ALKALINE PHOSPHATASE ISOENZYMES
Alk Phos Bone Calc: 48 U/L (ref 5–58)
Alk Phos Intestinal Calc: 2 U/L (ref <15)
Alkaline phosphatase (APISO): 184 U/L — ABNORMAL HIGH (ref 33–130)
Liver %: 73 % (ref 44–84)

## 2013-06-10 ENCOUNTER — Other Ambulatory Visit (HOSPITAL_COMMUNITY): Payer: Self-pay | Admitting: Family Medicine

## 2013-06-10 DIAGNOSIS — R112 Nausea with vomiting, unspecified: Secondary | ICD-10-CM

## 2013-06-11 ENCOUNTER — Encounter (HOSPITAL_COMMUNITY): Payer: Self-pay

## 2013-06-11 ENCOUNTER — Ambulatory Visit (HOSPITAL_COMMUNITY)
Admission: RE | Admit: 2013-06-11 | Discharge: 2013-06-11 | Disposition: A | Discharge status: Home / Self Care | Payer: Medicare Other | Source: Ambulatory Visit | Attending: Family Medicine | Admitting: Family Medicine

## 2013-06-11 ENCOUNTER — Telehealth (HOSPITAL_COMMUNITY): Payer: Self-pay

## 2013-06-11 DIAGNOSIS — K8689 Other specified diseases of pancreas: Secondary | ICD-10-CM | POA: Insufficient documentation

## 2013-06-11 DIAGNOSIS — I81 Portal vein thrombosis: Secondary | ICD-10-CM | POA: Insufficient documentation

## 2013-06-11 DIAGNOSIS — Z85038 Personal history of other malignant neoplasm of large intestine: Secondary | ICD-10-CM | POA: Insufficient documentation

## 2013-06-11 DIAGNOSIS — R1033 Periumbilical pain: Secondary | ICD-10-CM | POA: Insufficient documentation

## 2013-06-11 DIAGNOSIS — I8500 Esophageal varices without bleeding: Secondary | ICD-10-CM | POA: Insufficient documentation

## 2013-06-11 DIAGNOSIS — R112 Nausea with vomiting, unspecified: Secondary | ICD-10-CM | POA: Insufficient documentation

## 2013-06-11 MED ORDER — IOHEXOL 300 MG/ML  SOLN
80.0000 mL | Freq: Once | INTRAMUSCULAR | Status: AC | PRN
  Administered 2013-06-11: 70 mL via INTRAVENOUS

## 2013-06-13 ENCOUNTER — Telehealth (HOSPITAL_COMMUNITY): Payer: Self-pay

## 2013-06-13 NOTE — Telephone Encounter (Signed)
06/11/13 8:31am - Called hm# unable to lv msg - called cell# left msg for pt to call and set-up appt.. 9:40am 06/12/13 Called hm# unable to lv msg - called cell# left msg for pt to call and set-up an appt.Marland KitchenMarguerite Olea

## 2013-06-17 DIAGNOSIS — Z8719 Personal history of other diseases of the digestive system: Secondary | ICD-10-CM

## 2013-06-17 DIAGNOSIS — K529 Noninfective gastroenteritis and colitis, unspecified: Secondary | ICD-10-CM

## 2013-06-17 DIAGNOSIS — D649 Anemia, unspecified: Secondary | ICD-10-CM

## 2013-06-17 HISTORY — DX: Noninfective gastroenteritis and colitis, unspecified: K52.9

## 2013-06-17 HISTORY — DX: Anemia, unspecified: D64.9

## 2013-06-17 HISTORY — DX: Personal history of other diseases of the digestive system: Z87.19

## 2013-06-22 ENCOUNTER — Inpatient Hospital Stay (HOSPITAL_COMMUNITY)
Admission: EM | Admit: 2013-06-22 | Discharge: 2013-06-26 | DRG: 391 | Disposition: A | Discharge status: Home / Self Care | Payer: Medicare Other | Attending: Internal Medicine | Admitting: Internal Medicine

## 2013-06-22 ENCOUNTER — Encounter (HOSPITAL_COMMUNITY): Payer: Self-pay | Admitting: *Deleted

## 2013-06-22 DIAGNOSIS — K5289 Other specified noninfective gastroenteritis and colitis: Principal | ICD-10-CM

## 2013-06-22 DIAGNOSIS — F419 Anxiety disorder, unspecified: Secondary | ICD-10-CM

## 2013-06-22 DIAGNOSIS — D45 Polycythemia vera: Secondary | ICD-10-CM | POA: Diagnosis present

## 2013-06-22 DIAGNOSIS — R109 Unspecified abdominal pain: Secondary | ICD-10-CM

## 2013-06-22 DIAGNOSIS — D62 Acute posthemorrhagic anemia: Secondary | ICD-10-CM

## 2013-06-22 DIAGNOSIS — K861 Other chronic pancreatitis: Secondary | ICD-10-CM | POA: Diagnosis present

## 2013-06-22 DIAGNOSIS — D6859 Other primary thrombophilia: Secondary | ICD-10-CM | POA: Diagnosis present

## 2013-06-22 DIAGNOSIS — D751 Secondary polycythemia: Secondary | ICD-10-CM

## 2013-06-22 DIAGNOSIS — D72829 Elevated white blood cell count, unspecified: Secondary | ICD-10-CM

## 2013-06-22 DIAGNOSIS — R188 Other ascites: Secondary | ICD-10-CM

## 2013-06-22 DIAGNOSIS — C189 Malignant neoplasm of colon, unspecified: Secondary | ICD-10-CM

## 2013-06-22 DIAGNOSIS — Z859 Personal history of malignant neoplasm, unspecified: Secondary | ICD-10-CM

## 2013-06-22 DIAGNOSIS — K55069 Acute infarction of intestine, part and extent unspecified: Secondary | ICD-10-CM

## 2013-06-22 DIAGNOSIS — F1010 Alcohol abuse, uncomplicated: Secondary | ICD-10-CM

## 2013-06-22 DIAGNOSIS — K55059 Acute (reversible) ischemia of intestine, part and extent unspecified: Secondary | ICD-10-CM | POA: Diagnosis present

## 2013-06-22 DIAGNOSIS — I10 Essential (primary) hypertension: Secondary | ICD-10-CM

## 2013-06-22 DIAGNOSIS — K922 Gastrointestinal hemorrhage, unspecified: Secondary | ICD-10-CM

## 2013-06-22 DIAGNOSIS — R310 Gross hematuria: Secondary | ICD-10-CM | POA: Diagnosis present

## 2013-06-22 DIAGNOSIS — R197 Diarrhea, unspecified: Secondary | ICD-10-CM

## 2013-06-22 DIAGNOSIS — Z85038 Personal history of other malignant neoplasm of large intestine: Secondary | ICD-10-CM

## 2013-06-22 DIAGNOSIS — K709 Alcoholic liver disease, unspecified: Secondary | ICD-10-CM | POA: Diagnosis present

## 2013-06-22 DIAGNOSIS — Z720 Tobacco use: Secondary | ICD-10-CM

## 2013-06-22 DIAGNOSIS — F17200 Nicotine dependence, unspecified, uncomplicated: Secondary | ICD-10-CM | POA: Diagnosis present

## 2013-06-22 DIAGNOSIS — K859 Acute pancreatitis without necrosis or infection, unspecified: Secondary | ICD-10-CM

## 2013-06-22 DIAGNOSIS — Z8542 Personal history of malignant neoplasm of other parts of uterus: Secondary | ICD-10-CM

## 2013-06-22 LAB — COMPREHENSIVE METABOLIC PANEL
ALT: 56 U/L — ABNORMAL HIGH (ref 0–35)
AST: 57 U/L — ABNORMAL HIGH (ref 0–37)
Albumin: 4.2 g/dL (ref 3.5–5.2)
CO2: 19 mEq/L (ref 19–32)
Calcium: 10.1 mg/dL (ref 8.4–10.5)
Chloride: 98 mEq/L (ref 96–112)
GFR calc non Af Amer: 49 mL/min — ABNORMAL LOW (ref >90)
Sodium: 132 mEq/L — ABNORMAL LOW (ref 135–145)
Total Bilirubin: 0.9 mg/dL (ref 0.3–1.2)

## 2013-06-22 LAB — URINALYSIS, ROUTINE W REFLEX MICROSCOPIC
Glucose, UA: NEGATIVE mg/dL
Protein, ur: 30 mg/dL — AB
pH: 5.5 (ref 5.0–8.0)

## 2013-06-22 LAB — CBC WITH DIFFERENTIAL/PLATELET
Basophils Absolute: 0.1 10*3/uL (ref 0.0–0.1)
Basophils Relative: 0 % (ref 0–1)
Lymphocytes Relative: 12 % (ref 12–46)
MCHC: 35.3 g/dL (ref 30.0–36.0)
Neutro Abs: 21.1 10*3/uL — ABNORMAL HIGH (ref 1.7–7.7)
Platelets: 299 10*3/uL (ref 150–400)
RDW: 15.1 % (ref 11.5–15.5)
WBC: 27.1 10*3/uL — ABNORMAL HIGH (ref 4.0–10.5)

## 2013-06-22 LAB — URINE MICROSCOPIC-ADD ON

## 2013-06-22 MED ORDER — HYDROMORPHONE HCL PF 1 MG/ML IJ SOLN: 0.5000 mg | Freq: Once | INTRAMUSCULAR | Status: AC

## 2013-06-22 MED ORDER — ONDANSETRON HCL 4 MG/2ML IJ SOLN
INTRAMUSCULAR | Status: AC
  Administered 2013-06-22: 4 mg via INTRAVENOUS
  Filled 2013-06-22: qty 2

## 2013-06-22 MED ORDER — SODIUM CHLORIDE 0.9 % IV BOLUS (SEPSIS)
1000.0000 mL | Freq: Once | INTRAVENOUS | Status: AC
  Administered 2013-06-22: 1000 mL via INTRAVENOUS

## 2013-06-22 MED ORDER — IOHEXOL 300 MG/ML  SOLN
50.0000 mL | Freq: Once | INTRAMUSCULAR | Status: AC | PRN
  Administered 2013-06-22: 50 mL via ORAL

## 2013-06-22 MED ORDER — ONDANSETRON 4 MG PO TBDP
8.0000 mg | ORAL_TABLET | Freq: Once | ORAL | Status: AC
  Administered 2013-06-22: 8 mg via ORAL
  Filled 2013-06-22: qty 2

## 2013-06-22 MED ORDER — ONDANSETRON HCL 4 MG/2ML IJ SOLN: 4.0000 mg | Freq: Once | INTRAMUSCULAR | Status: AC

## 2013-06-22 MED ORDER — HYDROMORPHONE HCL PF 1 MG/ML IJ SOLN
INTRAMUSCULAR | Status: AC
  Administered 2013-06-22: 0.5 mg via INTRAVENOUS
  Filled 2013-06-22: qty 1

## 2013-06-22 NOTE — ED Provider Notes (Signed)
History    CSN: [id number] Arrival date & time 06/22/13  1191  First MD Initiated Contact with Patient 06/22/13 2200     Chief Complaint  Patient presents with  . Abdominal Pain   (Consider location/radiation/quality/duration/timing/severity/associated sxs/prior Treatment) HPI Laura Farley 57 y.o. with a history of hypertension, colon cancer, superior mesenteric venous thrombosis presents emergency department for worsening abdominal pain. Pain started 3 or 4 days ago has been worsening. Pain is generalized around the umbilicus, a cramping aching in character, it's 7/10, worsened with vomiting, no known relieving factors, severe in severity. Associated symptoms include watery diarrhea 7-10 times a day. She denies blood in her stool. Also had intermittent vomiting nonbilious nonbloody for the past one day. She denies fevers. He does note that she's had chills, and "I have felt warm". Patient was being adequately with Coumadin but she recently had an elevated INR and has not taken in the past week. Past Medical History  Diagnosis Date  . Hypertension   . Tobacco use disorder   . Seizures     withdraw from xanax  . Cancer     Right colon cancer T3  . Colon cancer   . Neuromuscular disorder   . Colon cancer   . Uterine cancer    Past Surgical History  Procedure Laterality Date  . Colon surgery  2006  . Abdominal hysterectomy  1988    cancer  . Broken arm  2012    left  . Clavicle surgery  2012    left  . Tonsillectomy    . Tunneled venous port placement  2006  . Port-a-cath removal  10/31/2011    Procedure: REMOVAL PORT-A-CATH;  Surgeon: Atilano Ina, MD;  Location: [facility];  Service: General;  Laterality: N/A;  port-a-cath removal  . Esophagogastroduodenoscopy  02/10/2012    Procedure: ESOPHAGOGASTRODUODENOSCOPY (EGD);  Surgeon: Theda Belfast, MD;  Location: [facility] ENDOSCOPY;  Service: Endoscopy;  Laterality: N/A;   Family History  Problem Relation Age of  Onset  . Cancer Father     prostate   History  Substance Use Topics  . Smoking status: Current Every Day Smoker -- 0.50 packs/day  . Smokeless tobacco: Not on file  . Alcohol Use: No     Comment: stated she quit   OB History   Grav Para Term Preterm Abortions TAB SAB Ect Mult Living                 Review of Systems  Constitutional: Positive for chills, appetite change and fatigue. Negative for fever, diaphoresis and activity change.  HENT: Negative for sore throat, rhinorrhea, sneezing, drooling and trouble swallowing.   Eyes: Negative for discharge and redness.  Respiratory: Negative for cough, chest tightness, shortness of breath, wheezing and stridor.   Cardiovascular: Negative for chest pain and leg swelling.  Gastrointestinal: Positive for nausea, vomiting, abdominal pain and diarrhea. Negative for constipation and blood in stool.  Genitourinary: Negative for difficulty urinating.  Musculoskeletal: Negative for myalgias and arthralgias.  Skin: Negative for pallor.  Neurological: Negative for dizziness, syncope, speech difficulty, weakness, light-headedness and headaches.  Hematological: Negative for adenopathy. Does not bruise/bleed easily.  Psychiatric/Behavioral: Negative for confusion and agitation.    Allergies  Venlafaxine hcl  Home Medications   Current Outpatient Rx  Name  Route  Sig  Dispense  Refill  . amLODipine (NORVASC) 10 MG tablet   Oral   Take 1 tablet (10 mg total) by mouth daily.  30 tablet   0   . Calcium Carbonate-Vitamin D (CALCIUM + D PO)   Oral   Take 1 tablet by mouth every evening.         . clonazePAM (KLONOPIN) 2 MG tablet   Oral   Take 2 mg by mouth 2 (two) times daily as needed.          Marland Kitchen lisinopril (PRINIVIL,ZESTRIL) 40 MG tablet   Oral   Take 40 mg by mouth daily.         . Multiple Vitamin (MULTIVITAMIN WITH MINERALS) TABS   Oral   Take 1 tablet by mouth every evening.         Marland Kitchen oxyCODONE (OXY IR/ROXICODONE) 5  MG immediate release tablet   Oral   Take 1 tablet (5 mg total) by mouth every 6 (six) hours as needed for pain.   30 tablet   0   . pantoprazole (PROTONIX) 40 MG tablet   Oral   Take 40 mg by mouth daily.         Marland Kitchen PARoxetine (PAXIL) 40 MG tablet   Oral   Take 40 mg by mouth every morning.         . propranolol (INDERAL) 20 MG tablet   Oral   Take 1 tablet (20 mg total) by mouth 2 (two) times daily.   60 tablet   0   . spironolactone (ALDACTONE) 50 MG tablet   Oral   Take 1 tablet (50 mg total) by mouth daily.   30 tablet   0    BP 163/86  Pulse 84  Temp(Src) 97.4 F (36.3 C) (Axillary)  Resp 20  SpO2 98% Physical Exam  Constitutional: She is oriented to person, place, and time. She appears well-developed and well-nourished. No distress.  HENT:  Head: Normocephalic and atraumatic.  Right Ear: External ear normal.  Left Ear: External ear normal.  Eyes: Conjunctivae and EOM are normal. Right eye exhibits no discharge. Left eye exhibits no discharge.  Neck: Normal range of motion. Neck supple. No JVD present.  Cardiovascular: Normal rate, regular rhythm and normal heart sounds.  Exam reveals no gallop and no friction rub.   No murmur heard. Pulmonary/Chest: Effort normal and breath sounds normal. No stridor. No respiratory distress. She has no wheezes. She has no rales. She exhibits no tenderness.  Abdominal: Soft. Bowel sounds are normal. She exhibits no distension, no fluid wave and no mass. There is tenderness in the epigastric area, periumbilical area and suprapubic area. There is no rebound, no guarding, no tenderness at McBurney's point and negative Murphy's sign.  Musculoskeletal: Normal range of motion. She exhibits no edema.  Neurological: She is alert and oriented to person, place, and time.  Skin: Skin is warm. No rash noted. She is not diaphoretic.  Psychiatric: She has a normal mood and affect. Her behavior is normal.    ED Course  Procedures  (including critical care time) Labs Reviewed  CBC WITH DIFFERENTIAL - Abnormal; Notable for the following:    WBC 27.1 (*)    RBC 5.61 (*)    Hemoglobin 19.2 (*)    HCT 54.4 (*)    MCH 34.2 (*)    Neutrophils Relative % 78 (*)    Neutro Abs 21.1 (*)    Monocytes Absolute 2.5 (*)    All other components within normal limits  COMPREHENSIVE METABOLIC PANEL - Abnormal; Notable for the following:    Sodium 132 (*)    Potassium 5.3 (*)  Glucose, Bld 136 (*)    Creatinine, Ser 1.20 (*)    AST 57 (*)    ALT 56 (*)    Alkaline Phosphatase 283 (*)    GFR calc non Af Amer 49 (*)    GFR calc Af Amer 57 (*)    All other components within normal limits  LIPASE, BLOOD - Abnormal; Notable for the following:    Lipase 134 (*)    All other components within normal limits  URINALYSIS, ROUTINE W REFLEX MICROSCOPIC - Abnormal; Notable for the following:    Color, Urine RED (*)    APPearance CLOUDY (*)    Hgb urine dipstick LARGE (*)    Bilirubin Urine SMALL (*)    Protein, ur 30 (*)    Leukocytes, UA TRACE (*)    All other components within normal limits  URINE MICROSCOPIC-ADD ON - Abnormal; Notable for the following:    Squamous Epithelial / LPF FEW (*)    Bacteria, UA FEW (*)    Casts HYALINE CASTS (*)    All other components within normal limits  CG4 I-STAT (LACTIC ACID)   Ct Abdomen Pelvis W Contrast  06/23/2013   *RADIOLOGY REPORT*  Clinical Data: Abdominal pain for 4 days.  Blood in the urine.  CT ABDOMEN AND PELVIS WITH CONTRAST  Technique:  Multidetector CT imaging of the abdomen and pelvis was performed following the standard protocol during bolus administration of intravenous contrast.  Contrast: OMNIPAQUE IOHEXOL 300 MG/ML  SOLN  Comparison: 06/11/2013  Findings: Respiratory motion limits visualization of the lung bases.  There is thickening of the wall of the distal esophagus with contrast material in the esophagus suggesting reflux disease. Para esophageal varices are  present.  Interval improvement of thrombosis in the mesenteric veins.  The liver, spleen, gallbladder, adrenal glands, kidneys, abdominal aorta, and inferior vena cava are unremarkable.  Calcification in the head of the pancreas probably due to old chronic pancreatitis. No significant pancreatic ductal dilatation.  Calcification of the abdominal aorta without aneurysm.  The stomach is not abnormally distended.  There is wall thickening in the ileum which could represent enteritis or other infiltrative process. Focal fluid in the mesentery.  Given the presence of mesenteric vein thrombosis, ischemia is not excluded.  There is no evidence of pneumatosis or portal venous gas.  No small bowel distension.  Stool filled colon without evidence of wall thickening. No free air in the abdomen.  Pelvis:  Uterus appears to be surgically absent.  No abnormal adnexal masses.  Bladder wall is not thickened.  Stool filled rectosigmoid colon without diverticulitis.  Appendix is not identified.  No free or loculated pelvic fluid collections.  No significant pelvic lymphadenopathy.  Degenerative changes in the lumbar spine.  Spondylolysis and spondylolisthesis at L5-S1.  IMPRESSION: Improving mesenteric vein thrombosis is demonstrated.  However, there is wall thickening in the ileum with interloop fluid collection.  This could represent enteritis although given the previous mesenteric thrombus, bowel ischemia is not excluded. Calcification in the pancreatic head suggesting chronic pancreatitis.  Changes suggesting the lower esophageal reflux disease with esophageal varices.   Original Report Authenticated By: Burman Nieves, M.D.   No diagnosis found.  MDM  Patient is a 57 year old female with a history of superior mesenteric thrombosis, colon cancer, uterine cancer, hypertension presents emergency Department with worsening abdominal pain. She also had nonbilious nonbloody emesis and watery diarrhea. She specifically denies any  blood in her urine which is contrary to the nursing note. Afebrile  vital signs are stable. He appears in no acute distress. On exam is generalized tenderness palpation of her abdomen with some localization to the umbilicus. There is no rebound and no guarding. With distraction there is less evidence of tenderness on exam. Signs or symptoms of peritonitis. Negative heel tap. LFTs slightly elevated compared to her last CMP. White blood cell count remarkably elevated at 27,000, whereas she was at 6000 on her last visit. Hemoglobin is elevated at 19.2 as well. Slight worsening of creatinine as well. Lipase similar to previous. Urine was significant for a multiple RBCs. Considering the patient's history of mesenteric thrombosis cannot rule out mesenteric ischemia at this time. CT abdomen and pelvis indicated pending.  CT shows improving mesenteric vein thrombosis but there continues to be bowel thickening, in which ischemia cannot be excluded. Admission indicated for concern of colitis. Lactic acid negative, likely nonischemic. Labs and imaging reviewed. Patient admitted to hospital service.  I discussed patient's care with my attending, Dr. Jeraldine Loots.  Sena Hitch, MD 06/23/13 [phone]

## 2013-06-22 NOTE — ED Notes (Signed)
Correction to the above statement bloody urine instead of bloody stool

## 2013-06-22 NOTE — ED Notes (Signed)
The pts sone reports that5 the pts pain is worse.  He is unhappy

## 2013-06-22 NOTE — ED Notes (Signed)
abd pain for 3-4 days and she has had bloody diarrhea.  No n or v .  The pt has seen her doctor x2 and is scheduled to see a  Urologist soon

## 2013-06-23 ENCOUNTER — Emergency Department (HOSPITAL_COMMUNITY): Payer: Medicare Other

## 2013-06-23 ENCOUNTER — Encounter (HOSPITAL_COMMUNITY): Payer: Self-pay | Admitting: Radiology

## 2013-06-23 DIAGNOSIS — F17200 Nicotine dependence, unspecified, uncomplicated: Secondary | ICD-10-CM

## 2013-06-23 DIAGNOSIS — D72829 Elevated white blood cell count, unspecified: Secondary | ICD-10-CM | POA: Diagnosis present

## 2013-06-23 DIAGNOSIS — F1010 Alcohol abuse, uncomplicated: Secondary | ICD-10-CM

## 2013-06-23 DIAGNOSIS — I10 Essential (primary) hypertension: Secondary | ICD-10-CM

## 2013-06-23 DIAGNOSIS — D62 Acute posthemorrhagic anemia: Secondary | ICD-10-CM

## 2013-06-23 DIAGNOSIS — K922 Gastrointestinal hemorrhage, unspecified: Secondary | ICD-10-CM

## 2013-06-23 DIAGNOSIS — K55059 Acute (reversible) ischemia of intestine, part and extent unspecified: Secondary | ICD-10-CM

## 2013-06-23 DIAGNOSIS — R109 Unspecified abdominal pain: Secondary | ICD-10-CM

## 2013-06-23 DIAGNOSIS — D45 Polycythemia vera: Secondary | ICD-10-CM

## 2013-06-23 DIAGNOSIS — R319 Hematuria, unspecified: Secondary | ICD-10-CM

## 2013-06-23 DIAGNOSIS — K5289 Other specified noninfective gastroenteritis and colitis: Principal | ICD-10-CM | POA: Diagnosis present

## 2013-06-23 DIAGNOSIS — D751 Secondary polycythemia: Secondary | ICD-10-CM | POA: Diagnosis present

## 2013-06-23 LAB — CBC
MCH: 33.3 pg (ref 26.0–34.0)
MCHC: 34.5 g/dL (ref 30.0–36.0)
MCV: 96.3 fL (ref 78.0–100.0)
Platelets: 232 10*3/uL (ref 150–400)
RDW: 15.1 % (ref 11.5–15.5)

## 2013-06-23 LAB — BASIC METABOLIC PANEL
BUN: 18 mg/dL (ref 6–23)
CO2: 23 mEq/L (ref 19–32)
Calcium: 8.9 mg/dL (ref 8.4–10.5)
Creatinine, Ser: 1.07 mg/dL (ref 0.50–1.10)
Glucose, Bld: 103 mg/dL — ABNORMAL HIGH (ref 70–99)

## 2013-06-23 LAB — HEMOGLOBIN AND HEMATOCRIT, BLOOD
HCT: 41.6 % (ref 36.0–46.0)
Hemoglobin: 15.3 g/dL — ABNORMAL HIGH (ref 12.0–15.0)

## 2013-06-23 LAB — PROTIME-INR: INR: 1.17 (ref 0.00–1.49)

## 2013-06-23 MED ORDER — SODIUM CHLORIDE 0.9 % IV SOLN
INTRAVENOUS | Status: DC
  Administered 2013-06-23: 100 mL/h via INTRAVENOUS

## 2013-06-23 MED ORDER — SPIRONOLACTONE 50 MG PO TABS
50.0000 mg | ORAL_TABLET | Freq: Every day | ORAL | Status: DC
  Filled 2013-06-23: qty 1

## 2013-06-23 MED ORDER — ONDANSETRON HCL 4 MG PO TABS
4.0000 mg | ORAL_TABLET | Freq: Four times a day (QID) | ORAL | Status: DC | PRN
  Administered 2013-06-23: 4 mg via ORAL
  Filled 2013-06-23 (×2): qty 1

## 2013-06-23 MED ORDER — PAROXETINE HCL 20 MG PO TABS
40.0000 mg | ORAL_TABLET | Freq: Every day | ORAL | Status: DC
  Administered 2013-06-23 – 2013-06-26 (×4): 40 mg via ORAL
  Filled 2013-06-23 (×5): qty 2

## 2013-06-23 MED ORDER — ZOLPIDEM TARTRATE 5 MG PO TABS
5.0000 mg | ORAL_TABLET | Freq: Every evening | ORAL | Status: DC | PRN
  Administered 2013-06-23 – 2013-06-24 (×2): 5 mg via ORAL
  Filled 2013-06-23 (×2): qty 1

## 2013-06-23 MED ORDER — IOHEXOL 300 MG/ML  SOLN
100.0000 mL | Freq: Once | INTRAMUSCULAR | Status: AC | PRN
  Administered 2013-06-23: 100 mL via INTRAVENOUS

## 2013-06-23 MED ORDER — WARFARIN - PHARMACIST DOSING INPATIENT
Freq: Every day | Status: DC
  Administered 2013-06-25: 18:00:00

## 2013-06-23 MED ORDER — ACETAMINOPHEN 650 MG RE SUPP: 650.0000 mg | Freq: Four times a day (QID) | RECTAL | Status: DC | PRN

## 2013-06-23 MED ORDER — ACETAMINOPHEN 325 MG PO TABS: 650.0000 mg | ORAL_TABLET | Freq: Four times a day (QID) | ORAL | Status: DC | PRN

## 2013-06-23 MED ORDER — VITAMIN B-1 100 MG PO TABS
100.0000 mg | ORAL_TABLET | Freq: Every day | ORAL | Status: DC
  Administered 2013-06-24 – 2013-06-26 (×3): 100 mg via ORAL
  Filled 2013-06-23 (×4): qty 1

## 2013-06-23 MED ORDER — AMLODIPINE BESYLATE 10 MG PO TABS
10.0000 mg | ORAL_TABLET | Freq: Every day | ORAL | Status: DC
  Administered 2013-06-23 – 2013-06-25 (×2): 10 mg via ORAL
  Filled 2013-06-23 (×4): qty 1

## 2013-06-23 MED ORDER — FOLIC ACID 1 MG PO TABS
1.0000 mg | ORAL_TABLET | Freq: Every day | ORAL | Status: DC
  Administered 2013-06-23 – 2013-06-26 (×4): 1 mg via ORAL
  Filled 2013-06-23 (×4): qty 1

## 2013-06-23 MED ORDER — ONDANSETRON HCL 4 MG/2ML IJ SOLN: 4.0000 mg | Freq: Four times a day (QID) | INTRAMUSCULAR | Status: DC | PRN

## 2013-06-23 MED ORDER — PANTOPRAZOLE SODIUM 40 MG IV SOLR
40.0000 mg | Freq: Two times a day (BID) | INTRAVENOUS | Status: DC
  Administered 2013-06-23 – 2013-06-26 (×7): 40 mg via INTRAVENOUS
  Filled 2013-06-23 (×9): qty 40

## 2013-06-23 MED ORDER — LORAZEPAM 1 MG PO TABS: 1.0000 mg | ORAL_TABLET | Freq: Four times a day (QID) | ORAL | Status: DC | PRN

## 2013-06-23 MED ORDER — LORAZEPAM 2 MG/ML IJ SOLN: 1.0000 mg | Freq: Four times a day (QID) | INTRAMUSCULAR | Status: DC | PRN

## 2013-06-23 MED ORDER — LORAZEPAM 2 MG/ML IJ SOLN
0.0000 mg | Freq: Two times a day (BID) | INTRAMUSCULAR | Status: DC
  Filled 2013-06-23: qty 1

## 2013-06-23 MED ORDER — ADULT MULTIVITAMIN W/MINERALS CH
1.0000 | ORAL_TABLET | Freq: Every day | ORAL | Status: DC
  Administered 2013-06-23 – 2013-06-26 (×4): 1 via ORAL
  Filled 2013-06-23 (×4): qty 1

## 2013-06-23 MED ORDER — ONDANSETRON HCL 4 MG/2ML IJ SOLN
4.0000 mg | Freq: Three times a day (TID) | INTRAMUSCULAR | Status: DC | PRN
  Administered 2013-06-23: 4 mg via INTRAVENOUS
  Filled 2013-06-23: qty 2

## 2013-06-23 MED ORDER — ALUM & MAG HYDROXIDE-SIMETH 200-200-20 MG/5ML PO SUSP: 30.0000 mL | Freq: Four times a day (QID) | ORAL | Status: DC | PRN

## 2013-06-23 MED ORDER — NICOTINE 21 MG/24HR TD PT24
21.0000 mg | MEDICATED_PATCH | Freq: Every day | TRANSDERMAL | Status: DC
  Administered 2013-06-23 – 2013-06-26 (×4): 21 mg via TRANSDERMAL
  Filled 2013-06-23 (×4): qty 1

## 2013-06-23 MED ORDER — HEPARIN (PORCINE) IN NACL 100-0.45 UNIT/ML-% IJ SOLN
750.0000 [IU]/h | INTRAMUSCULAR | Status: DC
  Administered 2013-06-23 – 2013-06-24 (×2): 750 [IU]/h via INTRAVENOUS
  Filled 2013-06-23 (×2): qty 250

## 2013-06-23 MED ORDER — CALCIUM CARBONATE-VITAMIN D 500-200 MG-UNIT PO TABS
1.0000 | ORAL_TABLET | Freq: Every day | ORAL | Status: DC
  Administered 2013-06-23 – 2013-06-26 (×4): 1 via ORAL
  Filled 2013-06-23 (×4): qty 1

## 2013-06-23 MED ORDER — PROPRANOLOL HCL 20 MG PO TABS
20.0000 mg | ORAL_TABLET | Freq: Two times a day (BID) | ORAL | Status: DC
  Administered 2013-06-23 – 2013-06-26 (×5): 20 mg via ORAL
  Filled 2013-06-23 (×10): qty 1

## 2013-06-23 MED ORDER — PIPERACILLIN-TAZOBACTAM 3.375 G IVPB
3.3750 g | Freq: Three times a day (TID) | INTRAVENOUS | Status: DC
  Administered 2013-06-23 – 2013-06-26 (×9): 3.375 g via INTRAVENOUS
  Filled 2013-06-23 (×12): qty 50

## 2013-06-23 MED ORDER — WARFARIN SODIUM 5 MG PO TABS
5.0000 mg | ORAL_TABLET | ORAL | Status: AC
  Administered 2013-06-23: 5 mg via ORAL
  Filled 2013-06-23: qty 1

## 2013-06-23 MED ORDER — OXYCODONE HCL 5 MG PO TABS
5.0000 mg | ORAL_TABLET | ORAL | Status: DC | PRN
  Administered 2013-06-23 – 2013-06-24 (×2): 5 mg via ORAL
  Filled 2013-06-23 (×2): qty 1

## 2013-06-23 MED ORDER — BOOST / RESOURCE BREEZE PO LIQD
1.0000 | Freq: Two times a day (BID) | ORAL | Status: DC
  Administered 2013-06-23 – 2013-06-26 (×6): 1 via ORAL

## 2013-06-23 MED ORDER — CHLORDIAZEPOXIDE HCL 5 MG PO CAPS: 10.0000 mg | ORAL_CAPSULE | Freq: Three times a day (TID) | ORAL | Status: DC

## 2013-06-23 MED ORDER — HYDROMORPHONE HCL PF 1 MG/ML IJ SOLN: 0.5000 mg | INTRAMUSCULAR | Status: DC | PRN

## 2013-06-23 MED ORDER — LORAZEPAM 2 MG/ML IJ SOLN: 0.0000 mg | Freq: Four times a day (QID) | INTRAMUSCULAR | Status: AC

## 2013-06-23 MED ORDER — SODIUM CHLORIDE 0.9 % IV SOLN
INTRAVENOUS | Status: DC
  Administered 2013-06-23: 12:00:00 via INTRAVENOUS

## 2013-06-23 MED ORDER — THIAMINE HCL 100 MG/ML IJ SOLN
100.0000 mg | Freq: Every day | INTRAMUSCULAR | Status: DC
  Administered 2013-06-23: 100 mg via INTRAVENOUS
  Filled 2013-06-23 (×4): qty 1

## 2013-06-23 MED ORDER — CHLORDIAZEPOXIDE HCL 5 MG PO CAPS
10.0000 mg | ORAL_CAPSULE | Freq: Three times a day (TID) | ORAL | Status: DC
  Administered 2013-06-23 – 2013-06-24 (×6): 10 mg via ORAL
  Filled 2013-06-23 (×2): qty 2
  Filled 2013-06-23: qty 1
  Filled 2013-06-23 (×2): qty 2
  Filled 2013-06-23 (×2): qty 1

## 2013-06-23 NOTE — Consult Note (Signed)
[facility] Reason for consult: abdominal pain, diarrhea Referring Physician: [facility]. PCP: Dr Caryl Asp Laura Farley is an 57 y.o. female.  HPI: 57 year old woman who is hospitalized after being discharged about 4 weeks ago. She has a history of colon and uterine cancer and has had previous surgeries as well as chemotherapy. She has a history of previous right hammy colectomy in a history of esophageal varices alcoholic liver disease. 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 The patient had CT scan demonstrating portal vein and SMV thrombosis. She clinically improved with anticoagulation and was discharged on Coumadin. Patient reports that she did well until yesterday. On July 4 she went to a picnic and was able to eat a reasonable amount of food with no particular symptoms. The best of her knowledge, no one else became ill at the picnic she developed fairly sudden abdominal pain yesterday associated with diarrhea and there was a note of body stools but the patient tells me the blood may have been in her urine. In any event it all seems to have cleared. Her main complaint was nausea and vomiting which is much better and she now is keeping down clear liquids. On admission lipase was up slightly 134 as were AST and ALT. Potassium and sodium relegated as well. WBC 27.1 improved with antibiotics. Urinalysis reveal gross hematuria. Repeat CT showed improved thrombosis in the mesenteric vein with a thick and ileum. Patient is currently on Zosyn in addition to her other chronic medications. She appears to be tolerating clear liquid diet. INR was normal. Patient is on heparin infusion Past Medical History  Diagnosis Date  . Hypertension   . Tobacco use disorder   . Seizures     withdraw from xanax  . Cancer     Right colon cancer T3  . Colon cancer   . Neuromuscular disorder   . Colon cancer   . Uterine  cancer     Past Surgical History  Procedure Laterality Date  . Colon surgery  2006  . Abdominal hysterectomy  1988    cancer  . Broken arm  2012    left  . Clavicle surgery  2012    left  . Tonsillectomy    . Tunneled venous port placement  2006  . Port-a-cath removal  10/31/2011    Procedure: REMOVAL PORT-A-CATH;  Surgeon: Atilano Ina, MD;  Location: [facility];  Service: General;  Laterality: N/A;  port-a-cath removal  . Esophagogastroduodenoscopy  02/10/2012    Procedure: ESOPHAGOGASTRODUODENOSCOPY (EGD);  Surgeon: Theda Belfast, MD;  [facility]: [facility] END[facility]COPY;  Service: Endoscopy;  Laterality: N/A;    Family History  Problem Relation Age of Onset  . Cancer Father     prostate  . Hypertension Sister      X  2  . Hypertension Brother     Social History:  reports that she has been smoking.  She does not have any smokeless tobacco history on file. She reports that she does not drink alcohol or use illicit drugs.  Allergies:  Allergies  Allergen Reactions  . Venlafaxine Hcl Other (See Comments)    Shortness of breath, irritable and mean    Medications; . amLODipine  10 mg Oral Daily  . calcium-vitamin D  1 tablet Oral Daily  . chlordiazePOXIDE  10 mg Oral TID  . folic acid  1 mg Oral Daily  . LORazepam  0-4 mg Intravenous Q6H  Followed by  . [START ON 06/25/2013] LORazepam  0-4 mg Intravenous Q12H  . multivitamin with minerals  1 tablet Oral Daily  . nicotine  21 mg Transdermal Daily  . pantoprazole (PROTONIX) IV  40 mg Intravenous Q12H  . PARoxetine  40 mg Oral Daily  . piperacillin-tazobactam (ZOSYN)  IV  3.375 g Intravenous Q8H  . propranolol  20 mg Oral BID  . thiamine  100 mg Oral Daily   Or  . thiamine  100 mg Intravenous Daily   PRN Meds acetaminophen, acetaminophen, alum & mag hydroxide-simeth, HYDROmorphone (DILAUDID) injection, ondansetron (ZOFRAN) IV, oxyCODONE, zolpidem Results for orders placed during the hospital encounter of  06/22/13 (from the past 48 hour(s))  CBC WITH DIFFERENTIAL     Status: Abnormal   Collection Time    06/22/13  7:45 PM      Result Value Range   WBC 27.1 (*) 4.0 - 10.5 K/uL   RBC 5.61 (*) 3.87 - 5.11 MIL/uL   Hemoglobin 19.2 (*) 12.0 - 15.0 g/dL   HCT 16.1 (*) 09.6 - 04.5 %   MCV 97.0  78.0 - 100.0 fL   MCH 34.2 (*) 26.0 - 34.0 pg   MCHC 35.3  30.0 - 36.0 g/dL   RDW 40.9  81.1 - 91.4 %   Platelets 299  150 - 400 K/uL   Neutrophils Relative % 78 (*) 43 - 77 %   Neutro Abs 21.1 (*) 1.7 - 7.7 K/uL   Lymphocytes Relative 12  12 - 46 %   Lymphs Abs 3.3  0.7 - 4.0 K/uL   Monocytes Relative 9  3 - 12 %   Monocytes Absolute 2.5 (*) 0.1 - 1.0 K/uL   Eosinophils Relative 1  0 - 5 %   Eosinophils Absolute 0.3  0.0 - 0.7 K/uL   Basophils Relative 0  0 - 1 %   Basophils Absolute 0.1  0.0 - 0.1 K/uL  COMPREHENSIVE METABOLIC PANEL     Status: Abnormal   Collection Time    06/22/13  7:45 PM      Result Value Range   Sodium 132 (*) 135 - 145 mEq/L   Potassium 5.3 (*) 3.5 - 5.1 mEq/L   Chloride 98  96 - 112 mEq/L   CO2 19  19 - 32 mEq/L   Glucose, Bld 136 (*) 70 - 99 mg/dL   BUN 21  6 - 23 mg/dL   Creatinine, Ser 7.82 (*) 0.50 - 1.10 mg/dL   Calcium 95.6  8.4 - 21.3 mg/dL   Total Protein 8.3  6.0 - 8.3 g/dL   Albumin 4.2  3.5 - 5.2 g/dL   AST 57 (*) 0 - 37 U/L   ALT 56 (*) 0 - 35 U/L   Alkaline Phosphatase 283 (*) 39 - 117 U/L   Total Bilirubin 0.9  0.3 - 1.2 mg/dL   GFR calc non Af Amer 49 (*) >90 mL/min   GFR calc Af Amer 57 (*) >90 mL/min   Comment:            The eGFR has been calculated     using the CKD EPI equation.     This calculation has not been     validated in all clinical     situations.     eGFR's persistently     <90 mL/min signify     possible Chronic Kidney Disease.  LIPASE, BLOOD     Status: Abnormal   Collection Time  06/22/13  7:45 PM      Result Value Range   Lipase 134 (*) 11 - 59 U/L  URINALYSIS, ROUTINE W REFLEX MICROSCOPIC     Status: Abnormal    Collection Time    06/22/13  9:44 PM      Result Value Range   Color, Urine RED (*) YELLOW   Comment: BIOCHEMICALS MAY BE AFFECTED BY COLOR   APPearance CLOUDY (*) CLEAR   Specific Gravity, Urine 1.012  1.005 - 1.030   pH 5.5  5.0 - 8.0   Glucose, UA NEGATIVE  NEGATIVE mg/dL   Hgb urine dipstick LARGE (*) NEGATIVE   Bilirubin Urine SMALL (*) NEGATIVE   Ketones, ur NEGATIVE  NEGATIVE mg/dL   Protein, ur 30 (*) NEGATIVE mg/dL   Urobilinogen, UA 0.2  0.0 - 1.0 mg/dL   Nitrite NEGATIVE  NEGATIVE   Leukocytes, UA TRACE (*) NEGATIVE  URINE MICROSCOPIC-ADD ON     Status: Abnormal   Collection Time    06/22/13  9:44 PM      Result Value Range   Squamous Epithelial / LPF FEW (*) RARE   WBC, UA 0-2  <3 WBC/hpf   RBC / HPF TOO NUMEROUS TO COUNT  <3 RBC/hpf   Bacteria, UA FEW (*) RARE   Casts HYALINE CASTS (*) NEGATIVE  CG4 I-STAT (LACTIC ACID)     Status: None   Collection Time    06/23/13  1:21 AM      Result Value Range   Lactic Acid, Venous 0.70  0.5 - 2.2 mmol/L  BASIC METABOLIC PANEL     Status: Abnormal   Collection Time    06/23/13  5:15 AM      Result Value Range   Sodium 132 (*) 135 - 145 mEq/L   Potassium 5.0  3.5 - 5.1 mEq/L   Chloride 99  96 - 112 mEq/L   CO2 23  19 - 32 mEq/L   Glucose, Bld 103 (*) 70 - 99 mg/dL   BUN 18  6 - 23 mg/dL   Creatinine, Ser 1.61  0.50 - 1.10 mg/dL   Calcium 8.9  8.4 - 09.6 mg/dL   GFR calc non Af Amer 56 (*) >90 mL/min   GFR calc Af Amer 65 (*) >90 mL/min   Comment:            The eGFR has been calculated     using the CKD EPI equation.     This calculation has not been     validated in all clinical     situations.     eGFR's persistently     <90 mL/min signify     possible Chronic Kidney Disease.  CBC     Status: Abnormal   Collection Time    06/23/13  5:15 AM      Result Value Range   WBC 12.1 (*) 4.0 - 10.5 K/uL   RBC 4.63  3.87 - 5.11 MIL/uL   Hemoglobin 15.4 (*) 12.0 - 15.0 g/dL   Comment: DELTA CHECK NOTED     REPEATED  TO VERIFY   HCT 44.6  36.0 - 46.0 %   MCV 96.3  78.0 - 100.0 fL   MCH 33.3  26.0 - 34.0 pg   MCHC 34.5  30.0 - 36.0 g/dL   RDW 04.5  40.9 - 81.1 %   Platelets 232  150 - 400 K/uL  HEMOGLOBIN AND HEMATOCRIT, BLOOD     Status: Abnormal   Collection Time  06/23/13  8:19 AM      Result Value Range   Hemoglobin 15.3 (*) 12.0 - 15.0 g/dL   HCT 78.2  95.6 - 21.3 %  TYPE AND SCREEN     Status: None   Collection Time    06/23/13  8:20 AM      Result Value Range   ABO/RH(D) O POS     Antibody Screen NEG     Sample Expiration 06/26/2013     Unit Number [id number]     Blood Component Type RED CELLS,LR     Unit division 00     Status of Unit ALLOCATED     Transfusion Status OK TO TRANSFUSE     Crossmatch Result Compatible     Unit Number [id number]     Blood Component Type RED CELLS,LR     Unit division 00     Status of Unit ALLOCATED     Transfusion Status OK TO TRANSFUSE     Crossmatch Result Compatible    PROTIME-INR     Status: None   Collection Time    06/23/13  9:57 AM      Result Value Range   Prothrombin Time 14.7  11.6 - 15.2 seconds   INR 1.17  0.00 - 1.49    Ct Abdomen Pelvis W Contrast  06/23/2013   *RADIOLOGY REPORT*  Clinical Data: Abdominal pain for 4 days.  Blood in the urine.  CT ABDOMEN AND PELVIS WITH CONTRAST  Technique:  Multidetector CT imaging of the abdomen and pelvis was performed following the standard protocol during bolus administration of intravenous contrast.  Contrast: OMNIPAQUE IOHEXOL 300 MG/ML  SOLN  Comparison: 06/11/2013  Findings: Respiratory motion limits visualization of the lung bases.  There is thickening of the wall of the distal esophagus with contrast material in the esophagus suggesting reflux disease. Para esophageal varices are present.  Interval improvement of thrombosis in the mesenteric veins.  The liver, spleen, gallbladder, adrenal glands, kidneys, abdominal aorta, and inferior vena cava are unremarkable.  Calcification in  the head of the pancreas probably due to old chronic pancreatitis. No significant pancreatic ductal dilatation.  Calcification of the abdominal aorta without aneurysm.  The stomach is not abnormally distended.  There is wall thickening in the ileum which could represent enteritis or other infiltrative process. Focal fluid in the mesentery.  Given the presence of mesenteric vein thrombosis, ischemia is not excluded.  There is no evidence of pneumatosis or portal venous gas.  No small bowel distension.  Stool filled colon without evidence of wall thickening. No free air in the abdomen.  Pelvis:  Uterus appears to be surgically absent.  No abnormal adnexal masses.  Bladder wall is not thickened.  Stool filled rectosigmoid colon without diverticulitis.  Appendix is not identified.  No free or loculated pelvic fluid collections.  No significant pelvic lymphadenopathy.  Degenerative changes in the lumbar spine.  Spondylolysis and spondylolisthesis at L5-S1.  IMPRESSION: Improving mesenteric vein thrombosis is demonstrated.  However, there is wall thickening in the ileum with interloop fluid collection.  This could represent enteritis although given the previous mesenteric thrombus, bowel ischemia is not excluded. Calcification in the pancreatic head suggesting chronic pancreatitis.  Changes suggesting the lower esophageal reflux disease with esophageal varices.   Original Report Authenticated By: Burman Nieves, M.D.               Blood pressure 121/84, pulse 79, temperature 98.2 F (36.8 C), temperature source Oral, resp. rate 16,  height 5\' 2"  (1.575 m), weight 54.885 kg (121 lb), SpO2 97.00%.  Physical exam:   General-- alert white female in no acute distress Heart-- regular rate and rhythm without murmurs are gallops Lungs--clear Abdomen-- soft with mild tenderness bowel sounds are normal   Assessment: 1. Acute nausea and vomiting/diarrhea/abdominal pain. This actually appears more to be  consistent with an acute gastroenteritis than significant G.I. bleeding. The patient was able to eat a large amount of food at a picnic over the weekend and already is feeling better. 2. Recent acute SMA/PV thrombosis. Possibly due to cirrhosis or to underlying problem related to her previous cancers and chemotherapy. The patient and apparently was supposed to return to the oncologist for workup for hypercoagulable state. She was supposedly on Coumadin is outpatient per PT was normal. She definitely needs to be anticoagulated. 3. History of alcohol abuse. 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 Prior history of colon cancer and uterine cancer 5. Hematuria. Probably related to Coumadin  Plan: 1. Patient is clearly improving with antibiotics and I would go ahead and continue this. Culture stool urine she remains febrile or white count increases. Feel that she does need to follow-up with hematology to evaluate for cause of for hypercoagulable state 2. Would resume oral anticoagulation is it is possible   Jasin Brazel JR,Alireza Pollack L 06/23/2013, 2:17 PM

## 2013-06-23 NOTE — ED Notes (Signed)
Report called successfully.

## 2013-06-23 NOTE — H&P (Signed)
[facility] History and Physical  Laura Farley HYQ:[id number] DOB: 1956-09-16 DOA: 06/22/2013  Referring physician: EDP PCP: Aura Dials, MD  Specialists:   Chief Complaint: ABD Pain and Bloody Stools  HPI: Laura Farley is a 57 y.o. female with a history of Colon and Uterine Cancers S/P resections along with a history of Superior Mesenteric Vein Thrombosis who presents to the ED with complaints of Severe Lower ABD Pain  Worsening over the past 3 days.  She reports having pain in her lower ABD for months, but the past 3 days the pain was worse. She alss reports having blood in her stools and in her urine.   She has had nausea, vomiting, and bloody diarrhea, along with fevers and chills.       Review of Systems: The patient denies anorexia, fever, chills, headaches, weight loss,, vision loss, diplopia, dizziness, decreased hearing, rhinitis, hoarseness, chest pain, syncope, dyspnea on exertion, peripheral edema, balance deficits, cough, hemoptysis, abdominal pain, nausea, vomiting, diarrhea, constipation, hematemesis, melena, severe indigestion/heartburn, dysuria, hematuria, incontinence, muscle weakness, suspicious skin lesions, transient blindness, difficulty walking, depression, unusual weight change, abnormal bleeding, enlarged lymph nodes, angioedema, and breast masses.    Past Medical History  Diagnosis Date  . Hypertension   . Tobacco use disorder   . Seizures     withdraw from xanax  . Cancer     Right colon cancer T3  . Colon cancer   . Neuromuscular disorder   . Colon cancer   . Uterine cancer     Past Surgical History  Procedure Laterality Date  . Colon surgery  2006  . Abdominal hysterectomy  1988    cancer  . Broken arm  2012    left  . Clavicle surgery  2012    left  . Tonsillectomy    . Tunneled venous port placement  2006  . Port-a-cath removal  10/31/2011    Procedure: REMOVAL PORT-A-CATH;  Surgeon: Atilano Ina, MD;  Location: [facility];  Service: General;  Laterality: N/A;  port-a-cath removal  . Esophagogastroduodenoscopy  02/10/2012    Procedure: ESOPHAGOGASTRODUODENOSCOPY (EGD);  Surgeon: Theda Belfast, MD;  Location: [facility] END[facility]COPY;  Service: Endoscopy;  Laterality: N/A;    Prior to Admission medications   Medication Sig Start Date End Date Taking? Authorizing Provider  amLODipine (NORVASC) 10 MG tablet Take 1 tablet (10 mg total) by mouth daily. 05/25/13  Yes Shanker Levora Dredge, MD  Calcium Carbonate-Vitamin D (CALCIUM + D PO) Take 1 tablet by mouth every evening.   Yes Historical Provider, MD  clonazePAM (KLONOPIN) 2 MG tablet Take 2 mg by mouth 2 (two) times daily as needed.    Yes Historical Provider, MD  lisinopril (PRINIVIL,ZESTRIL) 40 MG tablet Take 40 mg by mouth daily.   Yes Historical Provider, MD  Multiple Vitamin (MULTIVITAMIN WITH MINERALS) TABS Take 1 tablet by mouth every evening.   Yes Historical Provider, MD  oxyCODONE (OXY IR/ROXICODONE) 5 MG immediate release tablet Take 1 tablet (5 mg total) by mouth every 6 (six) hours as needed for pain. 05/25/13  Yes Shanker Levora Dredge, MD  pantoprazole (PROTONIX) 40 MG tablet Take 40 mg by mouth daily.   Yes Historical Provider, MD  PARoxetine (PAXIL) 40 MG tablet Take 40 mg by mouth every morning.   Yes Historical Provider, MD  propranolol (INDERAL) 20 MG tablet Take 1 tablet (20 mg total) by mouth 2 (two) times daily. 05/25/13  Yes Shanker Levora Dredge, MD  spironolactone (  ALDACTONE) 50 MG tablet Take 1 tablet (50 mg total) by mouth daily. 05/25/13  Yes Shanker Levora Dredge, MD    Allergies  Allergen Reactions  . Venlafaxine Hcl Other (See Comments)    Shortness of breath, irritable and mean    Social History:  reports that she has been smoking.  She does not have any smokeless tobacco history on file. She reports that she does not drink alcohol or use illicit drugs.     Family History  Problem Relation Age of Onset  . Cancer Father     prostate  .  Hypertension Sister      X  2  . Hypertension Brother     (be sure to complete)   Physical Exam:  GEN:  Pleasant well developed  57 y.o. Caucasian female  examined  and in no acute distress; cooperative with exam Filed Vitals:   06/23/13 0058 06/23/13 0200 06/23/13 0228 06/23/13 0320  BP: 163/86 105/61 107/62 130/70  Pulse: 84 79 75 76  Temp:      TempSrc:      Resp:      SpO2: 98% 95% 94% 97%   Blood pressure 130/70, pulse 76, temperature 97.4 F (36.3 C), temperature source Axillary, resp. rate 20, SpO2 97.00%. PSYCH: She is alert and oriented x4; does not appear anxious does not appear depressed; affect is normal HEENT: Normocephalic and Atraumatic, Mucous membranes pink; PERRLA; EOM intact; Fundi:  Benign;  No scleral icterus, Nares: Patent, Oropharynx: Clear, Fair Dentition, Neck:  FROM, no cervical lymphadenopathy nor thyromegaly or carotid bruit; no JVD; Breasts:: Not examined CHEST WALL: No tenderness CHEST: Normal respiration, clear to auscultation bilaterally HEART: Regular rate and rhythm; no murmurs rubs or gallops BACK: No kyphosis or scoliosis; no CVA tenderness ABDOMEN: Positive Bowel Sounds, soft non-tender; no masses, no organomegaly.    Rectal Exam: Not done EXTREMITIES: No cyanosis, clubbing or edema; no ulcerations. Genitalia: not examined PULSES: 2+ and symmetric SKIN: Normal hydration no rash or ulceration CNS: Cranial nerves 2-12 grossly intact no focal neurologic deficit    Labs on Admission:  Basic Metabolic Panel:  Recent Labs Lab 06/22/13 1945  NA 132*  K 5.3*  CL 98  CO2 19  GLUCOSE 136*  BUN 21  CREATININE 1.20*  CALCIUM 10.1   Liver Function Tests:  Recent Labs Lab 06/22/13 1945  AST 57*  ALT 56*  ALKPHOS 283*  BILITOT 0.9  PROT 8.3  ALBUMIN 4.2    Recent Labs Lab 06/22/13 1945  LIPASE 134*   No results found for this basename: AMMONIA,  in the last 168 hours CBC:  Recent Labs Lab 06/22/13 1945  WBC 27.1*   NEUTROABS 21.1*  HGB 19.2*  HCT 54.4*  MCV 97.0  PLT 299   Cardiac Enzymes: No results found for this basename: CKTOTAL, CKMB, CKMBINDEX, TROPONINI,  in the last 168 hours  BNP (last 3 results) No results found for this basename: PROBNP,  in the last 8760 hours CBG: No results found for this basename: GLUCAP,  in the last 168 hours  Radiological Exams on Admission: Ct Abdomen Pelvis W Contrast  06/23/2013   *RADIOLOGY REPORT*  Clinical Data: Abdominal pain for 4 days.  Blood in the urine.  CT ABDOMEN AND PELVIS WITH CONTRAST  Technique:  Multidetector CT imaging of the abdomen and pelvis was performed following the standard protocol during bolus administration of intravenous contrast.  Contrast: OMNIPAQUE IOHEXOL 300 MG/ML  SOLN  Comparison: 06/11/2013  Findings: Respiratory  motion limits visualization of the lung bases.  There is thickening of the wall of the distal esophagus with contrast material in the esophagus suggesting reflux disease. Para esophageal varices are present.  Interval improvement of thrombosis in the mesenteric veins.  The liver, spleen, gallbladder, adrenal glands, kidneys, abdominal aorta, and inferior vena cava are unremarkable.  Calcification in the head of the pancreas probably due to old chronic pancreatitis. No significant pancreatic ductal dilatation.  Calcification of the abdominal aorta without aneurysm.  The stomach is not abnormally distended.  There is wall thickening in the ileum which could represent enteritis or other infiltrative process. Focal fluid in the mesentery.  Given the presence of mesenteric vein thrombosis, ischemia is not excluded.  There is no evidence of pneumatosis or portal venous gas.  No small bowel distension.  Stool filled colon without evidence of wall thickening. No free air in the abdomen.  Pelvis:  Uterus appears to be surgically absent.  No abnormal adnexal masses.  Bladder wall is not thickened.  Stool filled rectosigmoid colon  without diverticulitis.  Appendix is not identified.  No free or loculated pelvic fluid collections.  No significant pelvic lymphadenopathy.  Degenerative changes in the lumbar spine.  Spondylolysis and spondylolisthesis at L5-S1.  IMPRESSION: Improving mesenteric vein thrombosis is demonstrated.  However, there is wall thickening in the ileum with interloop fluid collection.  This could represent enteritis although given the previous mesenteric thrombus, bowel ischemia is not excluded. Calcification in the pancreatic head suggesting chronic pancreatitis.  Changes suggesting the lower esophageal reflux disease with esophageal varices.   Original Report Authenticated By: Burman Nieves, M.D.     Assessment/Plan Principal Problem:   Other and unspecified noninfectious gastroenteritis and colitis(558.9) Active Problems:   GI bleed   Abdominal pain   Mesenteric vein thrombosis   Hypertension   Alcohol abuse   Tobacco abuse   Polycythemia   Leukocytosis, unspecified    1.  Acute Colitis-   IV  Zosyn, IVFs  Check C.diff PCR.   Supportive Care, consider GI consultation.      2.  GI Bleed- due to #1.   But Increased Hb(-?hemoconcentration or polycythemia).   Monitor Hb levels.    Consider GI Consultation.     3.   Mesenteric Vein thrombosis- Improving per CTscan results, had been on coumadin rx.    4.  ABD Pain - due to #1, and #2.    5.  HTN-  Monitor BPs, and IV Hydralazine PRN.    6.  Alcohol Abuse-  Add CIWA Protocol.    7.  Tobacco Abuse- reports decreasing down to 1/2 pack per week, counseled to continue to decrease until no longer smoking.     8.   SCDs for DVT prophylaxis.       Code Status:      FULL CODE Family Communication:  No Family Present Disposition Plan:    Return to Home on Discharge  Time spent:  53 Minutes  Ron Parker [facility] Pager [phone]  If 7PM-7AM, please contact night-coverage [email or web address] Password [facility] 06/23/2013, 3:41 AM

## 2013-06-23 NOTE — Progress Notes (Signed)
INITIAL NUTRITION ASSESSMENT  DOCUMENTATION CODES Per approved criteria  -Not Applicable   INTERVENTION:  Resource Breeze twice daily (250 kcals, 9 gm protein per 8 fl oz bottle) RD to follow for nutrition care plan  NUTRITION DIAGNOSIS: Inadequate oral intake related to N/V, abdominal pain as evidenced by patient report  Goal: Oral intake with meals & supplements to meet >/= 90% of estimated nutrition needs  Monitor:  PO & supplemental intake, weight, labs, I/O's  Reason for Assessment: Malnutrition Screening Tool Report  57 y.o. female  Admitting Dx: Other and unspecified noninfectious gastroenteritis and colitis(558.9)  ASSESSMENT: Patient with a history of colon and uterine cancers, s/p resections along with a history of superior mesenteric vein thrombosis who presented to ED with complaints of severe lower abdominal pain, N/V and bloody diarrhea along with fevers and chills.   Patient reports her appetite is poor, having nausea & vomiting this AM; no significant weight loss; GI consult note reviewed ---> N/V more consistent with acute gastroenteritis vs GI bleeding; signs of chronic pancreatitis on imaging studies; patient amenable to [employer] supplement ---> RD to order.  Height: Ht Readings from Last 1 Encounters:  06/23/13 5\' 2"  (1.575 m)    Weight: Wt Readings from Last 1 Encounters:  06/23/13 121 lb (54.885 kg)    Ideal Body Weight: 110 lb  % Ideal Body Weight: 110%  Wt Readings from Last 10 Encounters:  06/23/13 121 lb (54.885 kg)  05/25/13 125 lb 6.4 oz (56.881 kg)  02/09/12 104 lb 9.6 oz (47.446 kg)  02/09/12 104 lb 9.6 oz (47.446 kg)  10/27/11 110 lb 9.6 oz (50.168 kg)    Usual Body Weight: 125 lb  % Usual Body Weight: 96%  BMI:  Body mass index is 22.13 kg/(m^2).  Estimated Nutritional Needs: Kcal: 1600-1800 Protein: 80-90 gm Fluid: 1.6-1.8 L  Skin: Intact  Diet Order: Clear Liquid  EDUCATION NEEDS: -No education needs  identified at this time   Intake/Output Summary (Last 24 hours) at 06/23/13 1505 Last data filed at 06/23/13 0938  Gross per 24 hour  Intake      0 ml  Output    150 ml  Net   -150 ml    Labs:   Recent Labs Lab 06/22/13 1945 06/23/13 0515  NA 132* 132*  K 5.3* 5.0  CL 98 99  CO2 19 23  BUN 21 18  CREATININE 1.20* 1.07  CALCIUM 10.1 8.9  GLUCOSE 136* 103*    Scheduled Meds: . amLODipine  10 mg Oral Daily  . calcium-vitamin D  1 tablet Oral Daily  . chlordiazePOXIDE  10 mg Oral TID  . folic acid  1 mg Oral Daily  . LORazepam  0-4 mg Intravenous Q6H   Followed by  . [START ON 06/25/2013] LORazepam  0-4 mg Intravenous Q12H  . multivitamin with minerals  1 tablet Oral Daily  . nicotine  21 mg Transdermal Daily  . pantoprazole (PROTONIX) IV  40 mg Intravenous Q12H  . PARoxetine  40 mg Oral Daily  . piperacillin-tazobactam (ZOSYN)  IV  3.375 g Intravenous Q8H  . propranolol  20 mg Oral BID  . thiamine  100 mg Oral Daily   Or  . thiamine  100 mg Intravenous Daily    Continuous Infusions: . sodium chloride 75 mL/hr at 06/23/13 1130  . heparin 750 Units/hr (06/23/13 1425)    Past Medical History  Diagnosis Date  . Hypertension   . Tobacco use disorder   . Seizures  withdraw from xanax  . Cancer     Right colon cancer T3  . Colon cancer   . Neuromuscular disorder   . Colon cancer   . Uterine cancer     Past Surgical History  Procedure Laterality Date  . Colon surgery  2006  . Abdominal hysterectomy  1988    cancer  . Broken arm  2012    left  . Clavicle surgery  2012    left  . Tonsillectomy    . Tunneled venous port placement  2006  . Port-a-cath removal  10/31/2011    Procedure: REMOVAL PORT-A-CATH;  Surgeon: Atilano Ina, MD;  Location: [facility];  Service: General;  Laterality: N/A;  port-a-cath removal  . Esophagogastroduodenoscopy  02/10/2012    Procedure: ESOPHAGOGASTRODUODENOSCOPY (EGD);  Surgeon: Theda Belfast, MD;   Location: [facility] ENDOSCOPY;  Service: Endoscopy;  Laterality: N/A;    Maureen Chatters, RD, LDN Pager #: [phone] After-Hours Pager #: [phone]

## 2013-06-23 NOTE — Progress Notes (Signed)
ANTICOAGULATION CONSULT NOTE - Initial Consult  Pharmacy Consult for Coumadin Indication: mesenteric vein thrombosis  Allergies  Allergen Reactions  . Venlafaxine Hcl Other (See Comments)    Shortness of breath, irritable and mean    Patient Measurements: Height: 5\' 2"  (157.5 cm) Weight: 121 lb (54.885 kg) IBW/kg (Calculated) : 50.1 Heparin Dosing Weight: 55  Vital Signs: Temp: 98.2 F (36.8 C) (07/07 1405) Temp src: Oral (07/07 0938) BP: 121/84 mmHg (07/07 1405) Pulse Rate: 79 (07/07 1405)  Labs:  Recent Labs  06/22/13 1945 06/23/13 0515 06/23/13 0819 06/23/13 0957 06/23/13 1700 06/23/13 1933  HGB 19.2* 15.4* 15.3*  --  13.9 13.9  HCT 54.4* 44.6 43.7  --  41.6 40.1  PLT 299 232  --   --   --   --   LABPROT  --   --   --  14.7  --   --   INR  --   --   --  1.17  --   --   CREATININE 1.20* 1.07  --   --   --   --     Estimated Creatinine Clearance: 45.3 ml/min (by C-G formula based on Cr of 1.07).   Medical History: Past Medical History  Diagnosis Date  . Hypertension   . Tobacco use disorder   . Seizures     withdraw from xanax  . Cancer     Right colon cancer T3  . Colon cancer   . Neuromuscular disorder   . Colon cancer   . Uterine cancer     Medications:  Scheduled:  . amLODipine  10 mg Oral Daily  . calcium-vitamin D  1 tablet Oral Daily  . chlordiazePOXIDE  10 mg Oral TID  . feeding supplement  1 Container Oral BID BM  . folic acid  1 mg Oral Daily  . LORazepam  0-4 mg Intravenous Q6H   Followed by  . [START ON 06/25/2013] LORazepam  0-4 mg Intravenous Q12H  . multivitamin with minerals  1 tablet Oral Daily  . nicotine  21 mg Transdermal Daily  . pantoprazole (PROTONIX) IV  40 mg Intravenous Q12H  . PARoxetine  40 mg Oral Daily  . piperacillin-tazobactam (ZOSYN)  IV  3.375 g Intravenous Q8H  . propranolol  20 mg Oral BID  . thiamine  100 mg Oral Daily   Or  . thiamine  100 mg Intravenous Daily    Assessment: 57yo female who has been  on Coumadin for treatment of mesenteric vein thrombosis, presented to ED with 3-4 days of abdominal pain and hematuria.  She had been on Coumadin 5mg  daily, but due to INR 3.38 on 7/3 (verified with Dr. Zoe Lan' office) had been told to take 2.5mg  daily through weekend and was to follow-up today.  She did not take any Coumadin this weekend due to being sick.  Heparin is ordered to bridge while off Coumadin.  Hematuria is completely resolved per discussion with Dr. Thedore Mins.  Pharmacy asked to resume Coumadin this evening.  INR 1.17.  Per RN report urine has been clear.  Goal of Therapy:  INR 2-3   Plan:  1. Coumadin 5 mg po x 1. 2. Daily PT/INR. 3. Heparin level pending at 2200. 4. Monitor for any further hematuria.  Tad Moore, BCPS  Clinical Pharmacist Pager [phone]  06/23/2013 8:37 PM

## 2013-06-23 NOTE — Progress Notes (Signed)
[facility]                                                                                Patient Demographics  Laura Farley, is a 57 y.o. female, DOB - 1956/03/17, ZOX:[id number], WJX:[id number]  Admit date - 06/22/2013  Admitting Physician Ron Parker, MD  Outpatient Primary MD for the patient is Aura Dials, MD  LOS - 1   Chief Complaint  Patient presents with  . Abdominal Pain        Assessment & Plan    1. Acute Colitis- IV Zosyn, IVFs Check C.diff PCR. Supportive Care, GI called to see the patient in the light of her past history of colon cancer and mesenteric vein thrombosis. Of note she does not report any blood in her stool she says all the blood was in her urine, her Coumadin has been held we'll check INR, his bleeding has resolved the urine i.e. no hematuria clinically and H&H remains stable I will start her on low-dose heparin drip and monitor. Question if this is ischemic colitis.     2. GI Bleed- she denies any blood in stool says all the blood was in her urine which has now cleared, we'll monitor H&H type and screen, check stat INR as she is on Coumadin.   3. 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   4. ABD Pain - due to #1, and #2.    5. HTN- Monitor BPs, and IV Hydralazine PRN.    6. Alcohol Abuse and tobacco abuse. Counseled to quit both, continue on CIWA protocol, thiamine and folic acid supplementation, at schedule Librium and nicotine patch.      Code Status: Full  Family Communication: None present  Disposition Plan: Home   Procedures CT abdomen pelvis   Consults GI   DVT Prophylaxis  CDs and we'll initiate low-dose heparin drip  Lab Results  Component Value Date   PLT 232 06/23/2013    Medications  Scheduled Meds: . amLODipine  10 mg Oral Daily  . calcium-vitamin D  1 tablet Oral Daily  .  chlordiazePOXIDE  10 mg Oral TID  . folic acid  1 mg Oral Daily  . LORazepam  0-4 mg Intravenous Q6H   Followed by  . [START ON 06/25/2013] LORazepam  0-4 mg Intravenous Q12H  . multivitamin with minerals  1 tablet Oral Daily  . pantoprazole (PROTONIX) IV  40 mg Intravenous Q12H  . PARoxetine  40 mg Oral Daily  . propranolol  20 mg Oral BID  . thiamine  100 mg Oral Daily   Or  . thiamine  100 mg Intravenous Daily   Continuous Infusions: . sodium chloride     PRN Meds:.acetaminophen, acetaminophen, alum & mag hydroxide-simeth, HYDROmorphone (DILAUDID) injection, ondansetron (ZOFRAN) IV, oxyCODONE, zolpidem  Antibiotics    Anti-infectives   None       Time Spent in minutes  35   Tom Macpherson K M.D on 06/23/2013 at 9:45 AM  Between 7am to 7pm - Pager - [phone]  After 7pm go to [email or web address] - password [facility]  And look for the night coverage person covering for me after hours  [facility] Office  [phone]    Subjective:   Laura Farley today has, No headache, No chest pain, has periumbilical abdominal pain - No Nausea, No new weakness tingling or numbness, No Cough - SOB.     Objective:   Filed Vitals:   06/23/13 0320 06/23/13 0331 06/23/13 0651 06/23/13 0938  BP: 130/70 143/82 133/73 129/69  Pulse: 76 75 74 81  Temp:  98 F (36.7 C) 97.9 F (36.6 C) 98.2 F (36.8 C)  TempSrc:  Oral  Oral  Resp:  19 17 16   Height:  5\' 2"  (1.575 m)    Weight:  54.885 kg (121 lb)    SpO2: 97% 97% 98% 95%    Wt Readings from Last 3 Encounters:  06/23/13 54.885 kg (121 lb)  05/25/13 56.881 kg (125 lb 6.4 oz)  02/09/12 47.446 kg (104 lb 9.6 oz)     Intake/Output Summary (Last 24 hours) at 06/23/13 0945 Last data filed at 06/23/13 0938  Gross per 24 hour  Intake      0 ml  Output    150 ml  Net   -150 ml    Exam Awake Alert, Oriented X 3, No new F.N deficits, Normal affect Cylinder.AT,PERRAL Supple Neck,No JVD, No cervical lymphadenopathy appriciated.   Symmetrical Chest wall movement, Good air movement bilaterally, CTAB RRR,No Gallops,Rubs or new Murmurs, No Parasternal Heave +ve B.Sounds, Abd Soft, No organomegaly appriciated, No rebound - guarding or rigidity. There is periumbilical tenderness on palpation No Cyanosis, Clubbing or edema, No new Rash or bruise      Data Review   Micro Results No results found for this or any previous visit (from the past 240 hour(s)).  Radiology Reports Ct Abdomen W Contrast  06/11/2013   *RADIOLOGY REPORT*  Clinical Data: Periumbilical abdominal pain, nausea and vomiting. History of colon cancer diagnosed 2009.  CT ABDOMEN WITH CONTRAST  Technique:  Multidetector CT imaging of the abdomen was performed following the standard protocol during bolus administration of intravenous contrast.  Contrast: 70mL OMNIPAQUE IOHEXOL 300 MG/ML  SOLN  Comparison: 05/22/2013 CT abdomen/pelvis  Findings: Lung bases are clear.  Esophageal varices are redemonstrated.  Apparent interval resolution of previously seen perihepatic ascites.  There has been interval reduction in nonocclusive portal venous thrombus extension to the superior mesenteric vein, for example image 29, now with more peripheral and linear orientation in keeping with at least subacute chronicity.  The liver is minimally inhomogeneous without new focal abnormality. Gallbladder, right kidney, and adrenal glands are normal.  Peripheral wedge-shaped inhomogeneous hypodensity at the superior portion of the spleen image 12 is noted.  There is opacification of the splenic vein but a small thrombus could be obscured due to technique.  Pancreatic head and uncinate process calcification could indicate previous pancreatitis.  The pancreas enhances homogeneously with minimal prominence of the duct at the level of the pancreatic head measuring 3 mm maximally.  3 mm too small to characterize left renal cortical hypodensity image 25 is stable.  Trace left upper quadrant stranding  / fluid noted.  Possible injection site within the subcutaneous tissues of the left upper quadrant, image 26.  Degenerative changes are noted in the spine.  IMPRESSION: Reduction in the portal venous thrombosis clot burden with apparent evolution given its linear and more eccentrically positioned location within the portal vein and superior mesenteric vein.  Inhomogeneous enhancement at the superior margin of the spleen  which has been prior exams and could represent hypo enhancement relative to focal thrombosis although this could be related to vascular congestion or phase of enhancement.  Sequela of probable previous pancreatitis.  No acute intra-abdominal abnormality.   Original Report Authenticated By: Christiana Pellant, M.D.   Ct Abdomen Pelvis W Contrast  06/23/2013   *RADIOLOGY REPORT*  Clinical Data: Abdominal pain for 4 days.  Blood in the urine.  CT ABDOMEN AND PELVIS WITH CONTRAST  Technique:  Multidetector CT imaging of the abdomen and pelvis was performed following the standard protocol during bolus administration of intravenous contrast.  Contrast: OMNIPAQUE IOHEXOL 300 MG/ML  SOLN  Comparison: 06/11/2013  Findings: Respiratory motion limits visualization of the lung bases.  There is thickening of the wall of the distal esophagus with contrast material in the esophagus suggesting reflux disease. Para esophageal varices are present.  Interval improvement of thrombosis in the mesenteric veins.  The liver, spleen, gallbladder, adrenal glands, kidneys, abdominal aorta, and inferior vena cava are unremarkable.  Calcification in the head of the pancreas probably due to old chronic pancreatitis. No significant pancreatic ductal dilatation.  Calcification of the abdominal aorta without aneurysm.  The stomach is not abnormally distended.  There is wall thickening in the ileum which could represent enteritis or other infiltrative process. Focal fluid in the mesentery.  Given the presence of mesenteric vein  thrombosis, ischemia is not excluded.  There is no evidence of pneumatosis or portal venous gas.  No small bowel distension.  Stool filled colon without evidence of wall thickening. No free air in the abdomen.  Pelvis:  Uterus appears to be surgically absent.  No abnormal adnexal masses.  Bladder wall is not thickened.  Stool filled rectosigmoid colon without diverticulitis.  Appendix is not identified.  No free or loculated pelvic fluid collections.  No significant pelvic lymphadenopathy.  Degenerative changes in the lumbar spine.  Spondylolysis and spondylolisthesis at L5-S1.  IMPRESSION: Improving mesenteric vein thrombosis is demonstrated.  However, there is wall thickening in the ileum with interloop fluid collection.  This could represent enteritis although given the previous mesenteric thrombus, bowel ischemia is not excluded. Calcification in the pancreatic head suggesting chronic pancreatitis.  Changes suggesting the lower esophageal reflux disease with esophageal varices.   Original Report Authenticated By: Burman Nieves, M.D.    CBC  Recent Labs Lab 06/22/13 1945 06/23/13 0515 06/23/13 0819  WBC 27.1* 12.1*  --   HGB 19.2* 15.4* 15.3*  HCT 54.4* 44.6 43.7  PLT 299 232  --   MCV 97.0 96.3  --   MCH 34.2* 33.3  --   MCHC 35.3 34.5  --   RDW 15.1 15.1  --   LYMPHSABS 3.3  --   --   MONOABS 2.5*  --   --   EOSABS 0.3  --   --   BASOSABS 0.1  --   --     Chemistries   Recent Labs Lab 06/22/13 1945 06/23/13 0515  NA 132* 132*  K 5.3* 5.0  CL 98 99  CO2 19 23  GLUCOSE 136* 103*  BUN 21 18  CREATININE 1.20* 1.07  CALCIUM 10.1 8.9  AST 57*  --   ALT 56*  --   ALKPHOS 283*  --   BILITOT 0.9  --    ------------------------------------------------------------------------------------------------------------------ estimated creatinine clearance is 45.3 ml/min (by C-G formula based on Cr of  1.07). ------------------------------------------------------------------------------------------------------------------ No results found for this basename: HGBA1C,  in the last 72 hours ------------------------------------------------------------------------------------------------------------------  No results found for this basename: CHOL, HDL, LDLCALC, TRIG, CHOLHDL, LDLDIRECT,  in the last 72 hours ------------------------------------------------------------------------------------------------------------------ No results found for this basename: TSH, T4TOTAL, FREET3, T3FREE, THYROIDAB,  in the last 72 hours ------------------------------------------------------------------------------------------------------------------ No results found for this basename: VITAMINB12, FOLATE, FERRITIN, TIBC, IRON, RETICCTPCT,  in the last 72 hours  Coagulation profile No results found for this basename: INR, PROTIME,  in the last 168 hours  No results found for this basename: DDIMER,  in the last 72 hours  Cardiac Enzymes No results found for this basename: CK, CKMB, TROPONINI, MYOGLOBIN,  in the last 168 hours ------------------------------------------------------------------------------------------------------------------ No components found with this basename: POCBNP,

## 2013-06-23 NOTE — Progress Notes (Addendum)
ANTICOAGULATION & ANTIBIOTIC CONSULT NOTE - Initial Consult  Pharmacy Consult for Heparin, Zosyn Indication: Mesenteric Vein Thrombosis, Acute Cholecysititis  Allergies  Allergen Reactions  . Venlafaxine Hcl Other (See Comments)    Shortness of breath, irritable and mean    Patient Measurements: Height: 5\' 2"  (157.5 cm) Weight: 121 lb (54.885 kg) IBW/kg (Calculated) : 50.1 Heparin Dosing Weight: 55  Vital Signs: Temp: 98.2 F (36.8 C) (07/07 0938) Temp src: Oral (07/07 0938) BP: 129/69 mmHg (07/07 0938) Pulse Rate: 81 (07/07 0938)  Labs:  Recent Labs  06/22/13 1945 06/23/13 0515 06/23/13 0819  HGB 19.2* 15.4* 15.3*  HCT 54.4* 44.6 43.7  PLT 299 232  --   CREATININE 1.20* 1.07  --     Estimated Creatinine Clearance: 45.3 ml/min (by C-G formula based on Cr of 1.07).   Medical History: Past Medical History  Diagnosis Date  . Hypertension   . Tobacco use disorder   . Seizures     withdraw from xanax  . Cancer     Right colon cancer T3  . Colon cancer   . Neuromuscular disorder   . Colon cancer   . Uterine cancer     Medications:  Scheduled:  . amLODipine  10 mg Oral Daily  . calcium-vitamin D  1 tablet Oral Daily  . chlordiazePOXIDE  10 mg Oral TID  . folic acid  1 mg Oral Daily  . LORazepam  0-4 mg Intravenous Q6H   Followed by  . [START ON 06/25/2013] LORazepam  0-4 mg Intravenous Q12H  . multivitamin with minerals  1 tablet Oral Daily  . nicotine  21 mg Transdermal Daily  . pantoprazole (PROTONIX) IV  40 mg Intravenous Q12H  . PARoxetine  40 mg Oral Daily  . propranolol  20 mg Oral BID  . thiamine  100 mg Oral Daily   Or  . thiamine  100 mg Intravenous Daily    Assessment: 57yo female who has been on Coumadin for treatment of mesenteric vein thrombosis, presented to ED with 3-4 days of abdominal pain and hematuria.  She had been on Coumadin 5mg  daily, but due to INR 3.38 on 7/3 (verified with Dr. Zoe Lan' office) had been told to take 2.5mg   daily through weekend and was to follow-up today.  She did not take any Coumadin this weekend due to being sick.  Heparin is ordered to bridge while off Coumadin, INR is pending.  Hematuria is completely resolved per discussion with Dr. Thedore Mins.  She is also to start Zosyn for acute cholecystitis, CrCl is 57ml/min.  No adjustment for renal is necessary.  Goal of Therapy:  Heparin level 0.3-0.7 units/ml Monitor platelets by anticoagulation protocol: Yes   Plan:  1.  F/U INR before initiating heparin 2.  Zosyn 3.375gm IV q8, infuse over 4hr.  Marisue Humble, PharmD Clinical Pharmacist [facility]   06/23/13  Pharmacy- Heparin 1120  INR = 1.17, reflecting no doses through weekend.  U/A is (+)large hemoglobin and is red in appearance.  Will give no heparin bolus.  1.  Heparin 750 units/hr 2.  Heparin level in 8 hours 3.  Daily HL and CBC.  Marisue Humble, PharmD Clinical Pharmacist [facility]

## 2013-06-24 LAB — CBC
Hemoglobin: 14.2 g/dL (ref 12.0–15.0)
MCH: 33.3 pg (ref 26.0–34.0)
RBC: 4.27 MIL/uL (ref 3.87–5.11)
WBC: 6.5 10*3/uL (ref 4.0–10.5)

## 2013-06-24 LAB — PROTIME-INR
INR: 1.22 (ref 0.00–1.49)
Prothrombin Time: 15.1 seconds (ref 11.6–15.2)

## 2013-06-24 LAB — BASIC METABOLIC PANEL
CO2: 23 mEq/L (ref 19–32)
Calcium: 9.3 mg/dL (ref 8.4–10.5)
Chloride: 102 mEq/L (ref 96–112)
Glucose, Bld: 84 mg/dL (ref 70–99)
Potassium: 4.4 mEq/L (ref 3.5–5.1)
Sodium: 136 mEq/L (ref 135–145)

## 2013-06-24 LAB — HEPARIN LEVEL (UNFRACTIONATED): Heparin Unfractionated: 0.35 IU/mL (ref 0.30–0.70)

## 2013-06-24 MED ORDER — WARFARIN SODIUM 5 MG PO TABS
5.0000 mg | ORAL_TABLET | Freq: Once | ORAL | Status: AC
  Administered 2013-06-24: 5 mg via ORAL
  Filled 2013-06-24: qty 1

## 2013-06-24 MED ORDER — SODIUM CHLORIDE 0.9 % IV SOLN
INTRAVENOUS | Status: AC
  Administered 2013-06-24 – 2013-06-25 (×2): via INTRAVENOUS

## 2013-06-24 NOTE — ED Provider Notes (Signed)
I saw the patient in conjunction with the resident (Dr. Malen Gauze) and agree with his documentation with the following addendum / supplemental findings. I also saw the radiographic reports / ECG as documented, and agree with the interpretation.  On my exam patient was uncomfortable.  With her remarkable history CT scan was performed.  With the findings, she was admitted for further evaluation and management.  Gerhard Munch, MD 06/24/13 [phone]

## 2013-06-24 NOTE — Progress Notes (Signed)
[facility] PROGRESS NOTE Subjective Pt hungry no N+V or diarrhea. Apparently still getting clears in spite of order for full liquids.  Objective: Vital signs in last 24 hours: Temp:  [97.8 F (36.6 C)-98.2 F (36.8 C)] 97.8 F (36.6 C) (07/08 0853) Pulse Rate:  [54-79] 58 (07/08 0853) Resp:  [16-19] 16 (07/08 0853) BP: (93-121)/(50-84) 94/51 mmHg (07/08 0853) SpO2:  [97 %-100 %] 100 % (07/08 0853) Last BM Date: 06/22/13  Intake/Output from previous day: 07/07 0701 - 07/08 0700 In: 1046.8 [I.V.:996.8; IV Piggyback:50] Out: 350 [Urine:350] Intake/Output this shift:    PE: General--ambulating in room Heart-- Lungs-- Abdomen--soft nontender  Lab Results:  Recent Labs  06/22/13 1945 06/23/13 0515 06/23/13 0819 06/23/13 1700 06/23/13 1933  WBC 27.1* 12.1*  --   --   --   HGB 19.2* 15.4* 15.3* 13.9 13.9  HCT 54.4* 44.6 43.7 41.6 40.1  PLT 299 232  --   --   --    BMET  Recent Labs  06/22/13 1945 06/23/13 0515  NA 132* 132*  K 5.3* 5.0  CL 98 99  CO2 19 23  CREATININE 1.20* 1.07   LFT  Recent Labs  06/22/13 1945  PROT 8.3  AST 57*  ALT 56*  ALKPHOS 283*  BILITOT 0.9   PT/INR  Recent Labs  06/23/13 0957  LABPROT 14.7  INR 1.17   PANCREAS  Recent Labs  06/22/13 1945  LIPASE 134*         Studies/Results: Ct Abdomen Pelvis W Contrast  06/23/2013   *RADIOLOGY REPORT*  Clinical Data: Abdominal pain for 4 days.  Blood in the urine.  CT ABDOMEN AND PELVIS WITH CONTRAST  Technique:  Multidetector CT imaging of the abdomen and pelvis was performed following the standard protocol during bolus administration of intravenous contrast.  Contrast: OMNIPAQUE IOHEXOL 300 MG/ML  SOLN  Comparison: 06/11/2013  Findings: Respiratory motion limits visualization of the lung bases.  There is thickening of the wall of the distal esophagus with contrast material in the esophagus suggesting reflux disease. Para esophageal varices are present.   Interval improvement of thrombosis in the mesenteric veins.  The liver, spleen, gallbladder, adrenal glands, kidneys, abdominal aorta, and inferior vena cava are unremarkable.  Calcification in the head of the pancreas probably due to old chronic pancreatitis. No significant pancreatic ductal dilatation.  Calcification of the abdominal aorta without aneurysm.  The stomach is not abnormally distended.  There is wall thickening in the ileum which could represent enteritis or other infiltrative process. Focal fluid in the mesentery.  Given the presence of mesenteric vein thrombosis, ischemia is not excluded.  There is no evidence of pneumatosis or portal venous gas.  No small bowel distension.  Stool filled colon without evidence of wall thickening. No free air in the abdomen.  Pelvis:  Uterus appears to be surgically absent.  No abnormal adnexal masses.  Bladder wall is not thickened.  Stool filled rectosigmoid colon without diverticulitis.  Appendix is not identified.  No free or loculated pelvic fluid collections.  No significant pelvic lymphadenopathy.  Degenerative changes in the lumbar spine.  Spondylolysis and spondylolisthesis at L5-S1.  IMPRESSION: Improving mesenteric vein thrombosis is demonstrated.  However, there is wall thickening in the ileum with interloop fluid collection.  This could represent enteritis although given the previous mesenteric thrombus, bowel ischemia is not excluded. Calcification in the pancreatic head suggesting chronic pancreatitis.  Changes suggesting the lower esophageal reflux disease with esophageal varices.   Original Report  Authenticated By: Burman Nieves, M.D.    Medications: I have reviewed the patient's current medications.  Assessment/Plan: 1.N+V/diarrhea/abdominal pain. Apparently resolved was probably infectious. Will advance to [facility] diet and would advance as tolerated   Daymian Lill JR,Jamaira Sherk L 06/24/2013, 10:01 AM

## 2013-06-24 NOTE — Progress Notes (Signed)
ANTICOAGULATION CONSULT NOTE - Follow Up Consult  Pharmacy Consult for heparin Indication: mesenteric vein thrombosis  Labs:  Recent Labs  06/22/13 1945 06/23/13 0515 06/23/13 0819 06/23/13 0957 06/23/13 1700 06/23/13 1933 06/23/13 2200  HGB 19.2* 15.4* 15.3*  --  13.9 13.9  --   HCT 54.4* 44.6 43.7  --  41.6 40.1  --   PLT 299 232  --   --   --   --   --   LABPROT  --   --   --  14.7  --   --   --   INR  --   --   --  1.17  --   --   --   HEPARINUNFRC  --   --   --   --   --   --  0.48  CREATININE 1.20* 1.07  --   --   --   --   --     Assessment/Plan:  57yo female therapeutic on heparin with initial dosing for mesenteric vein thrombosis.  Will continue gtt at current rate and confirm stable with am labs.  Vernard Gambles, PharmD, BCPS  06/24/2013,3:07 AM

## 2013-06-24 NOTE — Progress Notes (Signed)
ANTICOAGULATION CONSULT NOTE - Follow Up Consult  Pharmacy Consult for Heparin/Warfarin Indication: mesenteric vein thrombosis  Allergies   Allergen  Reactions   .  Venlafaxine Hcl  Other (See Comments)     Shortness of breath, irritable and mean    Patient Measurements:  Height: 5\' 2"  (157.5 cm)  Weight: 121 lb (54.885 kg)  IBW/kg (Calculated) : 50.1  Heparin Dosing Weight: 55  Vital Signs:  Temp: 98.2 F (36.8 C) (07/07 1405)  Temp src: Oral (07/07 0938)  BP: 121/84 mmHg (07/07 1405)  Pulse Rate: 79 (07/07 1405)  Labs:  Recent Labs  06/22/13 1945 06/23/13 0515  06/23/13 0957 06/23/13 1700 06/23/13 1933 06/23/13 2200 06/24/13 0910  HGB 19.2* 15.4*  < >  --  13.9 13.9  --  14.2  HCT 54.4* 44.6  < >  --  41.6 40.1  --  41.6  PLT 299 232  --   --   --   --   --  180  LABPROT  --   --   --  14.7  --   --   --  15.1  INR  --   --   --  1.17  --   --   --  1.22  HEPARINUNFRC  --   --   --   --   --   --  0.48 0.35  CREATININE 1.20* 1.07  --   --   --   --   --  1.13*  < > = values in this interval not displayed.  Medical History:  Diagnosis   .  Hypertension   .  Tobacco use disorder   .  Seizures   .  Cancer   .  Colon cancer   .  Neuromuscular disorder   .  Colon cancer   .  Uterine cancer    Assessment  56yo female therapeutic on heparin at 0.35 IU/ml on rate of 750 units/hr.  This was her initial dosing for mesenteric vein thrombosis.  She was also resumed on her Warfarin therapy and her INR this morning is 1.22 with a goal of 2.0-3.0.  Her warfarin was not given until ~ 10PM, therefore full effect of that dose may not yet be seen.  Her H/H and platelets are all stable and she has no noted bleeding.    Labs this morning: Results for ANTARA, BRECHEISEN (MRN [id number]) as of 06/24/2013 12:07  Ref. Range 06/24/2013 09:10  Heparin Unfractionated Latest Range: 0.30-0.70 IU/mL 0.35  Prothrombin Time Latest Range: 11.6-15.2 seconds 15.1  INR Latest Range: 0.00-1.49   1.22   Goal of Therapy: INR    2.0-3.0 Heparin Level 0.3-0.7  Plan: 1.  Continue IV heparin at current rate and f/u AM level 2.  Give Warfarin 5mg  po x 1 tonight and f/u AM PT/INR 3.  Monitor for bleeding complications.  Nadara Mustard, PharmD., MS Clinical Pharmacist Pager:  [phone] Thank you for allowing pharmacy to be part of this patients care team. 06/24/2013,12:08 PM

## 2013-06-24 NOTE — Progress Notes (Addendum)
[facility]                                                                                Patient Demographics  Laura Farley, is a 57 y.o. female, DOB - 06-08-1956, ZOX:[id number], WJX:[id number]  Admit date - 06/22/2013  Admitting Physician Ron Parker, MD  Outpatient Primary MD for the patient is Aura Dials, MD  LOS - 2   Chief Complaint  Patient presents with  . Abdominal Pain        Assessment & Plan    1. Acute Colitis- IV Zosyn, IVFs Check C.diff PCR. Supportive Care, she has past history of colon cancer and mesenteric vein thrombosis. Of note she does not report any blood in her stool she says all the blood was in her urine, Question if this is ischemic colitis. She has been reinitiated on heparin and Coumadin as per GI recommendation, GI following the patient, with supportive care she has improved, no diarrhea since she is here, C. difficile PCR pending however clinically I doubt this is C. difficile. This could have been ischemic colitis. She is on full liquid diet will wait for GI for further recommendations.   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      2. ? GI Bleed which turned out to be chronic hematuria - she denies any blood in stool says all the blood was in her urine which has now cleared, we'll monitor H&H type and screen, hematuria is mild and chronic and she has already gotten an appointment with urology outpatient which she will maintain, Coumadin with heparin bridge has been started, continue to monitor H&H, today's lab is still pending, I have called the lab twice on morning missed labs they are about to draw it now.  Lab Results  Component Value Date   INR 1.17 06/23/2013   INR 1.30 05/25/2013    INR 1.06 05/24/2013      3. 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 Note today's leg labs are still pending.    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     4. HTN- blood pressure on the lower side, IV fluids, hydralazine when necessary if needed.     5. Alcohol Abuse and tobacco abuse. Counseled to quit both, continue on CIWA protocol, thiamine and folic acid supplementation, at schedule Librium and nicotine patch.      Code Status: Full  Family Communication: None present  Disposition Plan: Home   Procedures CT abdomen pelvis   Consults GI   DVT Prophylaxis  SCDs and we'll initiate low-dose heparin drip with Coumadin per pharm  Lab Results  Component Value Date  PLT 232 06/23/2013    Medications  Scheduled Meds: . amLODipine  10 mg Oral Daily  . calcium-vitamin D  1 tablet Oral Daily  . chlordiazePOXIDE  10 mg Oral TID  . feeding supplement  1 Container Oral BID BM  . folic acid  1 mg Oral Daily  . LORazepam  0-4 mg Intravenous Q6H   Followed by  . [START ON 06/25/2013] LORazepam  0-4 mg Intravenous Q12H  . multivitamin with minerals  1 tablet Oral Daily  . nicotine  21 mg Transdermal Daily  . pantoprazole (PROTONIX) IV  40 mg Intravenous Q12H  . PARoxetine  40 mg Oral Daily  . piperacillin-tazobactam (ZOSYN)  IV  3.375 g Intravenous Q8H  . propranolol  20 mg Oral BID  . thiamine  100 mg Oral Daily   Or  . thiamine  100 mg Intravenous Daily  . Warfarin - Pharmacist Dosing Inpatient   Does not apply q1800   Continuous Infusions: . sodium chloride    . heparin 750 Units/hr (06/23/13 1425)   PRN Meds:.acetaminophen, acetaminophen, alum &  mag hydroxide-simeth, HYDROmorphone (DILAUDID) injection, ondansetron (ZOFRAN) IV, oxyCODONE, zolpidem  Antibiotics    Anti-infectives   Start     Dose/Rate Route Frequency Ordered Stop   06/23/13 1400  piperacillin-tazobactam (ZOSYN) IVPB 3.375 g     3.375 g 12.5 mL/hr over 240 Minutes Intravenous 3 times per day 06/23/13 1051         Time Spent in minutes  35   Steve Gregg K M.D on 06/24/2013 at 9:47 AM  Between 7am to 7pm - Pager - [phone]  After 7pm go to [email or web address] - password [email or web address]  And look for the night coverage person covering for me after hours  [facility] Office  [phone]    Subjective:   Laura Farley today has, No headache, No chest pain, has periumbilical abdominal pain which is improving - No Nausea, No new weakness tingling or numbness, No Cough - SOB.     Objective:   Filed Vitals:   06/23/13 1405 06/23/13 2122 06/24/13 0610 06/24/13 0853  BP: 121/84 93/50 102/55 94/51  Pulse: 79 55 54 58  Temp: 98.2 F (36.8 C) 97.9 F (36.6 C) 97.9 F (36.6 C) 97.8 F (36.6 C)  TempSrc:  Oral Oral Oral  Resp:  18 19 16   Height:      Weight:      SpO2: 97% 98% 99% 100%    Wt Readings from Last 3 Encounters:  06/23/13 54.885 kg (121 lb)  05/25/13 56.881 kg (125 lb 6.4 oz)  02/09/12 47.446 kg (104 lb 9.6 oz)     Intake/Output Summary (Last 24 hours) at 06/24/13 0947 Last data filed at 06/24/13 0538  Gross per 24 hour  Intake 1046.76 ml  Output    200 ml  Net 846.76 ml    Exam Awake Alert, Oriented X 3, No new F.N deficits, Normal affect [place].AT,PERRAL Supple Neck,No JVD, No cervical lymphadenopathy appriciated.  Symmetrical Chest wall movement, Good air movement bilaterally, CTAB RRR,No Gallops,Rubs or new Murmurs, No Parasternal Heave +ve B.Sounds, Abd Soft, No organomegaly appriciated, No rebound - guarding or rigidity. There is periumbilical tenderness on palpation No Cyanosis, Clubbing or edema, No new Rash or bruise       Data Review   Micro Results No results found for this or any previous visit (from the past 240 hour(s)).  Radiology Reports Ct Abdomen W Contrast  06/11/2013   *RADIOLOGY REPORT*  Clinical Data:  Periumbilical abdominal pain, nausea and vomiting. History of colon cancer diagnosed 2009.  CT ABDOMEN WITH CONTRAST  Technique:  Multidetector CT imaging of the abdomen was performed following the standard protocol during bolus administration of intravenous contrast.  Contrast: 70mL OMNIPAQUE IOHEXOL 300 MG/ML  SOLN  Comparison: 05/22/2013 CT abdomen/pelvis  Findings: Lung bases are clear.  Esophageal varices are redemonstrated.  Apparent interval resolution of previously seen perihepatic ascites.  There has been interval reduction in nonocclusive portal venous thrombus extension to the superior mesenteric vein, for example image 29, now with more peripheral and linear orientation in keeping with at least subacute chronicity.  The liver is minimally inhomogeneous without new focal abnormality. Gallbladder, right kidney, and adrenal glands are normal.  Peripheral wedge-shaped inhomogeneous hypodensity at the superior portion of the spleen image 12 is noted.  There is opacification of the splenic vein but a small thrombus could be obscured due to technique.  Pancreatic head and uncinate process calcification could indicate previous pancreatitis.  The pancreas enhances homogeneously with minimal prominence of the duct at the level of the pancreatic head measuring 3 mm maximally.  3 mm too small to characterize left renal cortical hypodensity image 25 is stable.  Trace left upper quadrant stranding / fluid noted.  Possible injection site within the subcutaneous tissues of the left upper quadrant, image 26.  Degenerative changes are noted in the spine.  IMPRESSION: Reduction in the portal venous thrombosis clot burden with apparent evolution given its linear and more eccentrically positioned location within the  portal vein and superior mesenteric vein.  Inhomogeneous enhancement at the superior margin of the spleen which has been prior exams and could represent hypo enhancement relative to focal thrombosis although this could be related to vascular congestion or phase of enhancement.  Sequela of probable previous pancreatitis.  No acute intra-abdominal abnormality.   Original Report Authenticated By: Christiana Pellant, M.D.   Ct Abdomen Pelvis W Contrast  06/23/2013   *RADIOLOGY REPORT*  Clinical Data: Abdominal pain for 4 days.  Blood in the urine.  CT ABDOMEN AND PELVIS WITH CONTRAST  Technique:  Multidetector CT imaging of the abdomen and pelvis was performed following the standard protocol during bolus administration of intravenous contrast.  Contrast: OMNIPAQUE IOHEXOL 300 MG/ML  SOLN  Comparison: 06/11/2013  Findings: Respiratory motion limits visualization of the lung bases.  There is thickening of the wall of the distal esophagus with contrast material in the esophagus suggesting reflux disease. Para esophageal varices are present.  Interval improvement of thrombosis in the mesenteric veins.  The liver, spleen, gallbladder, adrenal glands, kidneys, abdominal aorta, and inferior vena cava are unremarkable.  Calcification in the head of the pancreas probably due to old chronic pancreatitis. No significant pancreatic ductal dilatation.  Calcification of the abdominal aorta without aneurysm.  The stomach is not abnormally distended.  There is wall thickening in the ileum which could represent enteritis or other infiltrative process. Focal fluid in the mesentery.  Given the presence of mesenteric vein thrombosis, ischemia is not excluded.  There is no evidence of pneumatosis or portal venous gas.  No small bowel distension.  Stool filled colon without evidence of wall thickening. No free air in the abdomen.  Pelvis:  Uterus appears to be surgically absent.  No abnormal adnexal masses.  Bladder wall is not  thickened.  Stool filled rectosigmoid colon without diverticulitis.  Appendix is not identified.  No free or loculated pelvic fluid collections.  No significant pelvic lymphadenopathy.  Degenerative changes  in the lumbar spine.  Spondylolysis and spondylolisthesis at L5-S1.  IMPRESSION: Improving mesenteric vein thrombosis is demonstrated.  However, there is wall thickening in the ileum with interloop fluid collection.  This could represent enteritis although given the previous mesenteric thrombus, bowel ischemia is not excluded. Calcification in the pancreatic head suggesting chronic pancreatitis.  Changes suggesting the lower esophageal reflux disease with esophageal varices.   Original Report Authenticated By: Burman Nieves, M.D.    CBC  Recent Labs Lab 06/22/13 1945 06/23/13 0515 06/23/13 0819 06/23/13 1700 06/23/13 1933  WBC 27.1* 12.1*  --   --   --   HGB 19.2* 15.4* 15.3* 13.9 13.9  HCT 54.4* 44.6 43.7 41.6 40.1  PLT 299 232  --   --   --   MCV 97.0 96.3  --   --   --   MCH 34.2* 33.3  --   --   --   MCHC 35.3 34.5  --   --   --   RDW 15.1 15.1  --   --   --   LYMPHSABS 3.3  --   --   --   --   MONOABS 2.5*  --   --   --   --   EOSABS 0.3  --   --   --   --   BASOSABS 0.1  --   --   --   --     Chemistries   Recent Labs Lab 06/22/13 1945 06/23/13 0515  NA 132* 132*  K 5.3* 5.0  CL 98 99  CO2 19 23  GLUCOSE 136* 103*  BUN 21 18  CREATININE 1.20* 1.07  CALCIUM 10.1 8.9  AST 57*  --   ALT 56*  --   ALKPHOS 283*  --   BILITOT 0.9  --    ------------------------------------------------------------------------------------------------------------------ estimated creatinine clearance is 45.3 ml/min (by C-G formula based on Cr of 1.07). ------------------------------------------------------------------------------------------------------------------ No results found for this basename: HGBA1C,  in the last 72  hours ------------------------------------------------------------------------------------------------------------------ No results found for this basename: CHOL, HDL, LDLCALC, TRIG, CHOLHDL, LDLDIRECT,  in the last 72 hours ------------------------------------------------------------------------------------------------------------------ No results found for this basename: TSH, T4TOTAL, FREET3, T3FREE, THYROIDAB,  in the last 72 hours ------------------------------------------------------------------------------------------------------------------ No results found for this basename: VITAMINB12, FOLATE, FERRITIN, TIBC, IRON, RETICCTPCT,  in the last 72 hours  Coagulation profile  Recent Labs Lab 06/23/13 0957  INR 1.17    No results found for this basename: DDIMER,  in the last 72 hours  Cardiac Enzymes No results found for this basename: CK, CKMB, TROPONINI, MYOGLOBIN,  in the last 168 hours ------------------------------------------------------------------------------------------------------------------ No components found with this basename: POCBNP,

## 2013-06-25 LAB — TYPE AND SCREEN: Unit division: 0

## 2013-06-25 LAB — CBC
Hemoglobin: 13.4 g/dL (ref 12.0–15.0)
Platelets: 154 10*3/uL (ref 150–400)
RBC: 4.06 MIL/uL (ref 3.87–5.11)
WBC: 5.5 10*3/uL (ref 4.0–10.5)

## 2013-06-25 LAB — HEPARIN LEVEL (UNFRACTIONATED): Heparin Unfractionated: 0.57 IU/mL (ref 0.30–0.70)

## 2013-06-25 LAB — BASIC METABOLIC PANEL
CO2: 24 mEq/L (ref 19–32)
Chloride: 105 mEq/L (ref 96–112)
Glucose, Bld: 89 mg/dL (ref 70–99)
Potassium: 4 mEq/L (ref 3.5–5.1)
Sodium: 138 mEq/L (ref 135–145)

## 2013-06-25 LAB — PROTIME-INR
INR: 1.51 — ABNORMAL HIGH (ref 0.00–1.49)
Prothrombin Time: 17.8 seconds — ABNORMAL HIGH (ref 11.6–15.2)

## 2013-06-25 MED ORDER — ENOXAPARIN (LOVENOX) PATIENT EDUCATION KIT
PACK | Freq: Once | Status: AC
  Administered 2013-06-25: 13:00:00
  Filled 2013-06-25: qty 1

## 2013-06-25 MED ORDER — WARFARIN SODIUM 5 MG PO TABS
5.0000 mg | ORAL_TABLET | Freq: Once | ORAL | Status: AC
  Administered 2013-06-25: 5 mg via ORAL
  Filled 2013-06-25: qty 1

## 2013-06-25 MED ORDER — ENOXAPARIN SODIUM 60 MG/0.6ML ~~LOC~~ SOLN
55.0000 mg | Freq: Two times a day (BID) | SUBCUTANEOUS | Status: DC
  Administered 2013-06-25 – 2013-06-26 (×3): 55 mg via SUBCUTANEOUS
  Filled 2013-06-25 (×4): qty 0.6

## 2013-06-25 NOTE — Care Management Note (Signed)
  Page 1 of 1   06/25/2013     11:23:00 AM   CARE MANAGEMENT NOTE 06/25/2013  Patient:  Laura Farley, Laura Farley   Account Number:  [id number]  Date Initiated:  06/25/2013  Documentation initiated by:  Ronny Flurry  Subjective/Objective Assessment:     Action/Plan:   Anticipated DC Date:     Anticipated DC Plan:           Choice offered to / List presented to:             Status of service:   Medicare Important Message given?   (If response is "NO", the following Medicare IM given date fields will be blank) Date Medicare IM given:   Date Additional Medicare IM given:    Discharge Disposition:    Per UR Regulation:    If discussed at Long Length of Stay Meetings, dates discussed:    Comments:    06-24-13 Benefits check Lovenox 60 mg BID SQ x 3 days  lovenox is not covered/ generic is covered at tier 4, retial co-pay is $95.00, no auth required, [facility] can [facility]  Ronny Flurry RN BSN [phone]

## 2013-06-25 NOTE — Progress Notes (Addendum)
Addendum Per physician request patient was switched from heparin to lovenox. Discussed discontinuation of heparin with nurse will wait 1 hr before starting lovenox SQ 1mg /kg (55mg ) q12hr. Informed and educated patient about lovenox.  Stuti Sandin M. Allena Katz, PharmD Clinical Pharmacist- Resident Pager: [phone] Pharmacy: [phone] 06/25/2013 11:57 AM  ANTICOAGULATION CONSULT NOTE - Follow Up Consult  Pharmacy Consult for Heparin/Warfarin Indication: mesenteric vein thrombosis  Allergies  Allergen Reactions  . Venlafaxine Hcl Other (See Comments)    Shortness of breath, irritable and mean    Patient Measurements: Height: 5\' 2"  (157.5 cm) Weight: 121 lb (54.885 kg) IBW/kg (Calculated) : 50.1 Heparin Dosing Weight: 55  Vital Signs: Temp: 98.1 F (36.7 C) (07/09 0546) Temp src: Oral (07/09 0546) BP: 107/54 mmHg (07/09 0546) Pulse Rate: 53 (07/09 0546)  Labs:  Recent Labs  06/23/13 0515  06/23/13 0957  06/23/13 1933 06/23/13 2200 06/24/13 0910 06/25/13 0515  HGB 15.4*  < >  --   < > 13.9  --  14.2 13.4  HCT 44.6  < >  --   < > 40.1  --  41.6 39.6  PLT 232  --   --   --   --   --  180 154  LABPROT  --   --  14.7  --   --   --  15.1 17.8*  INR  --   --  1.17  --   --   --  1.22 1.51*  HEPARINUNFRC  --   --   --   --   --  0.48 0.35 0.57  CREATININE 1.07  --   --   --   --   --  1.13* 1.04  < > = values in this interval not displayed.  Estimated Creatinine Clearance: 46.6 ml/min (by C-G formula based on Cr of 1.04).   Medications:  Prescriptions prior to admission  Medication Sig Dispense Refill  . amLODipine (NORVASC) 10 MG tablet Take 1 tablet (10 mg total) by mouth daily.  30 tablet  0  . Calcium Carbonate-Vitamin D (CALCIUM + D PO) Take 1 tablet by mouth every evening.      . clonazePAM (KLONOPIN) 2 MG tablet Take 2 mg by mouth 2 (two) times daily as needed.       Marland Kitchen lisinopril (PRINIVIL,ZESTRIL) 40 MG tablet Take 40 mg by mouth daily.      . Multiple Vitamin  (MULTIVITAMIN WITH MINERALS) TABS Take 1 tablet by mouth every evening.      Marland Kitchen oxyCODONE (OXY IR/ROXICODONE) 5 MG immediate release tablet Take 1 tablet (5 mg total) by mouth every 6 (six) hours as needed for pain.  30 tablet  0  . pantoprazole (PROTONIX) 40 MG tablet Take 40 mg by mouth daily.      Marland Kitchen PARoxetine (PAXIL) 40 MG tablet Take 40 mg by mouth every morning.      . propranolol (INDERAL) 20 MG tablet Take 1 tablet (20 mg total) by mouth 2 (two) times daily.  60 tablet  0  . spironolactone (ALDACTONE) 50 MG tablet Take 1 tablet (50 mg total) by mouth daily.  30 tablet  0    Assessment: 57yo female admitted with mesenteric vein thrombosis. Patient is on heparin infusion 750 units/hr with a therapeutic heparin level of 0.57 IU/mL. Patient is also on Warfarin 5mg  daily and her INR is trending up at 1.51 this morning with a goal of 2.0-3.0. Her CBC and platelets are all stable and she has some hematuria  and mentioned a urology appointment following discharge. Have educated patient to inform her doctor for any changes in current bleeding. Patient was educated this morning on warfarin.  Goal of Therapy:  INR 2-3 Heparin level 0.3-0.7 units/ml Heparin level 0.57 units/ml Monitor platelets by anticoagulation protocol: Yes   Plan:  1. Continue IV heparin at 750 units/hr and continue to check  daily heparin levels with AM labs 2. Continue Warfarin 5 mg po and continue to monitor PT/INR to reach goal INR range of 2-3.  3. Monitor for bleeding complications  4. Provided Warfarin education prior to discharge  Myrtle Haller M. Allena Katz, PharmD Clinical Pharmacist- Resident Pager: [phone] Pharmacy: [phone] 06/25/2013 10:05 AM

## 2013-06-25 NOTE — Progress Notes (Signed)
Pt continues to have blood in urine.  Instructed pt to save urine in measuring container for staff to observe.

## 2013-06-25 NOTE — Progress Notes (Signed)
[facility] PROGRESS NOTE  Laura Farley OZH:[id number] DOB: May 20, 1956 DOA: 06/22/2013 PCP: Aura Dials, MD  Assessment/Plan: Acute Colitis- IV Zosyn, IVFs Check C.diff PCR. Supportive Care, she has past history of colon cancer and mesenteric vein thrombosis. Of note she does not report any blood in her stool she says all the blood was in her urine, Question if this is ischemic colitis. She has been reinitiated on heparin and Coumadin as per GI recommendation, GI following the patient, with supportive care she has improved, no diarrhea since she is here, C. difficile PCR negative. Per GI prob infectious- advance diet as tolerated   ? 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    Alcohol Abuse and tobacco abuse. Counseled to quit both, continue on CIWA protocol, thiamine and folic acid supplementation, and nicotine patch.   Code Status: full Family Communication: patient  Disposition Plan: home in AM with lovenox   Consultants:  GI  Procedures:  none  Antibiotics:  none  HPI/Subjective: Wanting to go home -feeling better  Objective: Filed  Vitals:   06/24/13 1420 06/24/13 1757 06/24/13 2202 06/25/13 0546  BP: 100/50 103/50 110/64 107/54  Pulse: 66 65 71 53  Temp: 98.9 F (37.2 C) 98.4 F (36.9 C) 98.1 F (36.7 C) 98.1 F (36.7 C)  TempSrc: Oral Oral Oral Oral  Resp: 18 20 16 18   Height:      Weight:      SpO2: 97% 98% 98% 99%    Intake/Output Summary (Last 24 hours) at 06/25/13 1024 Last data filed at 06/25/13 0535  Gross per 24 hour  Intake 1746.43 ml  Output      0 ml  Net 1746.43 ml   Filed Weights   06/23/13 0331  Weight: 54.885 kg (121 lb)    Exam:   General:  Older than stated age  Cardiovascular: rrr  Respiratory: clear anterior  Abdomen: +BS, soft, NT  Musculoskeletal: moves all 4 ext   Data Reviewed: Basic Metabolic Panel:  Recent Labs Lab 06/22/13 1945 06/23/13 0515 06/24/13 0910 06/25/13 0515  NA 132* 132* 136 138  K 5.3* 5.0 4.4 4.0  CL 98 99 102 105  CO2 19 23 23 24   GLUCOSE 136* 103* 84 89  BUN 21 18 12 9   CREATININE 1.20* 1.07 1.13* 1.04  CALCIUM 10.1 8.9 9.3 8.8   Liver Function Tests:  Recent Labs Lab 06/22/13 1945  AST 57*  ALT 56*  ALKPHOS 283*  BILITOT 0.9  PROT 8.3  ALBUMIN 4.2    Recent Labs Lab 06/22/13 1945  LIPASE 134*   No results found for this basename: AMMONIA,  in  the last 168 hours CBC:  Recent Labs Lab 06/22/13 1945 06/23/13 0515 06/23/13 0819 06/23/13 1700 06/23/13 1933 06/24/13 0910 06/25/13 0515  WBC 27.1* 12.1*  --   --   --  6.5 5.5  NEUTROABS 21.1*  --   --   --   --   --   --   HGB 19.2* 15.4* 15.3* 13.9 13.9 14.2 13.4  HCT 54.4* 44.6 43.7 41.6 40.1 41.6 39.6  MCV 97.0 96.3  --   --   --  97.4 97.5  PLT 299 232  --   --   --  180 154   Cardiac Enzymes: No results found for this basename: CKTOTAL, CKMB, CKMBINDEX, TROPONINI,  in the last 168 hours BNP (last 3 results) No results found for this basename: PROBNP,  in the last 8760 hours CBG: No results found for this basename: GLUCAP,  in the last 168 hours  Recent  Results (from the past 240 hour(s))  CLOSTRIDIUM DIFFICILE BY PCR     Status: None   Collection Time    06/24/13 10:09 AM      Result Value Range Status   C difficile by pcr NEGATIVE  NEGATIVE Final     Studies: No results found.  Scheduled Meds: . amLODipine  10 mg Oral Daily  . calcium-vitamin D  1 tablet Oral Daily  . chlordiazePOXIDE  10 mg Oral TID  . feeding supplement  1 Container Oral BID BM  . folic acid  1 mg Oral Daily  . LORazepam  0-4 mg Intravenous Q12H  . multivitamin with minerals  1 tablet Oral Daily  . nicotine  21 mg Transdermal Daily  . pantoprazole (PROTONIX) IV  40 mg Intravenous Q12H  . PARoxetine  40 mg Oral Daily  . piperacillin-tazobactam (ZOSYN)  IV  3.375 g Intravenous Q8H  . propranolol  20 mg Oral BID  . thiamine  100 mg Oral Daily   Or  . thiamine  100 mg Intravenous Daily  . Warfarin - Pharmacist Dosing Inpatient   Does not apply q1800   Continuous Infusions: . sodium chloride 100 mL/hr at 06/24/13 1647  . heparin 750 Units/hr (06/24/13 2224)    Principal Problem:   Other and unspecified noninfectious gastroenteritis and colitis(558.9) Active Problems:   Hypertension   GI bleed   Alcohol abuse   Tobacco abuse   Abdominal pain   Mesenteric vein thrombosis   Polycythemia   Leukocytosis, unspecified    Time spent: 25    [facility], JESSICA  [facility] Pager [phone]. If 7PM-7AM, please contact night-coverage at [email or web address], password [email or web address] 06/25/2013, 10:24 AM  LOS: 3 days

## 2013-06-25 NOTE — Progress Notes (Signed)
[facility] PROGRESS NOTE Subjective Pt feels well tolerating diet and has no abdominal pain.  Objective: Vital signs in last 24 hours: Temp:  [97.8 F (36.6 C)-98.9 F (37.2 C)] 98.1 F (36.7 C) (07/09 0546) Pulse Rate:  [53-71] 53 (07/09 0546) Resp:  [16-20] 18 (07/09 0546) BP: (94-110)/(50-64) 107/54 mmHg (07/09 0546) SpO2:  [97 %-100 %] 99 % (07/09 0546) Last BM Date: 06/24/13  Intake/Output from previous day: 07/08 0701 - 07/09 0700 In: 1746.4 [P.O.:480; I.V.:1216.4; IV Piggyback:50] Out: -  Intake/Output this shift:    PE: General--no distress walking around the room  Abdomen--soft nontender  Lab Results:  Recent Labs  06/22/13 1945 06/23/13 0515 06/23/13 0819 06/23/13 1700 06/23/13 1933 06/24/13 0910 06/25/13 0515  WBC 27.1* 12.1*  --   --   --  6.5 5.5  HGB 19.2* 15.4* 15.3* 13.9 13.9 14.2 13.4  HCT 54.4* 44.6 43.7 41.6 40.1 41.6 39.6  PLT 299 232  --   --   --  180 154   BMET  Recent Labs  06/22/13 1945 06/23/13 0515 06/24/13 0910 06/25/13 0515  NA 132* 132* 136 138  K 5.3* 5.0 4.4 4.0  CL 98 99 102 105  CO2 19 23 23 24   CREATININE 1.20* 1.07 1.13* 1.04   LFT  Recent Labs  06/22/13 1945  PROT 8.3  AST 57*  ALT 56*  ALKPHOS 283*  BILITOT 0.9   PT/INR  Recent Labs  06/23/13 0957 06/24/13 0910 06/25/13 0515  LABPROT 14.7 15.1 17.8*  INR 1.17 1.22 1.51*   PANCREAS  Recent Labs  06/22/13 1945  LIPASE 134*         Studies/Results: No results found.  Medications: I have reviewed the patient's current medications.  Assessment/Plan: 1. Abdominal pain/N+V/Diarrhea. All resolved w/o intervention. Pt is w/o symptoms. Probably infectious. 2. 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  Would advance diet and discharge home if tolerated with Hematology follow-up and follow-up with Dr Dulce Sellar in 1 month   Fernado Brigante JR,Briceson Broadwater L 06/25/2013, 8:09 AM

## 2013-06-26 DIAGNOSIS — C189 Malignant neoplasm of colon, unspecified: Secondary | ICD-10-CM

## 2013-06-26 LAB — BASIC METABOLIC PANEL
BUN: 7 mg/dL (ref 6–23)
GFR calc non Af Amer: 54 mL/min — ABNORMAL LOW (ref >90)
Glucose, Bld: 105 mg/dL — ABNORMAL HIGH (ref 70–99)
Potassium: 3.8 mEq/L (ref 3.5–5.1)

## 2013-06-26 LAB — CBC
HCT: 39.5 % (ref 36.0–46.0)
Hemoglobin: 13.4 g/dL (ref 12.0–15.0)
MCH: 32.8 pg (ref 26.0–34.0)
MCHC: 33.9 g/dL (ref 30.0–36.0)
MCV: 96.8 fL (ref 78.0–100.0)

## 2013-06-26 MED ORDER — METRONIDAZOLE 500 MG PO TABS: 500.0000 mg | ORAL_TABLET | Freq: Three times a day (TID) | ORAL | Status: DC

## 2013-06-26 MED ORDER — WARFARIN SODIUM 5 MG PO TABS: 5.0000 mg | ORAL_TABLET | Freq: Every day | ORAL | Status: DC

## 2013-06-26 MED ORDER — ENOXAPARIN SODIUM 60 MG/0.6ML ~~LOC~~ SOLN: 55.0000 mg | Freq: Two times a day (BID) | SUBCUTANEOUS | Status: DC

## 2013-06-26 MED ORDER — CIPROFLOXACIN HCL 500 MG PO TABS: 500.0000 mg | ORAL_TABLET | Freq: Two times a day (BID) | ORAL | Status: DC

## 2013-06-26 MED ORDER — CLONAZEPAM 1 MG PO TABS: 2.0000 mg | ORAL_TABLET | Freq: Two times a day (BID) | ORAL | Status: DC | PRN

## 2013-06-26 MED ORDER — WARFARIN SODIUM 5 MG PO TABS
5.0000 mg | ORAL_TABLET | Freq: Every day | ORAL | Status: DC
  Filled 2013-06-26: qty 1

## 2013-06-26 NOTE — Discharge Summary (Signed)
Physician Discharge Summary  Laura Farley:[id number] DOB: 1956-08-07 DOA: 06/22/2013  PCP: Aura Dials, MD  Admit date: 06/22/2013 Discharge date: 06/26/2013  Time spent: 35 minutes  Recommendations for Outpatient Follow-up:  Pt/INR on Friday- continue lovenox until stopped by PCP- be cautious as patient on abx for 5 more days  Discharge Diagnoses:  Principal Problem:   Other and unspecified noninfectious gastroenteritis and colitis(558.9) Active Problems:   Hypertension   GI bleed   Alcohol abuse   Tobacco abuse   Abdominal pain   Mesenteric vein thrombosis   Polycythemia   Leukocytosis, unspecified   Discharge Condition: improved  Diet recommendation: cardiac  Filed Weights   06/23/13 0331  Weight: 54.885 kg (121 lb)    History of present illness:  Laura Farley is a 57 y.o. female with a history of Colon and Uterine Cancers S/P resections along with a history of Superior Mesenteric Vein Thrombosis who presents to the ED with complaints of Severe Lower ABD Pain Worsening over the past 3 days. She reports having pain in her lower ABD for months, but the past 3 days the pain was worse. She alss reports having blood in her stools and in her urine. She has had nausea, vomiting, and bloody diarrhea, along with fevers and chills.    Hospital Course:  Acute Colitis- IVFs; C.diff PCR. Supportive Care, she has past history of colon cancer and mesenteric vein thrombosis. Of note she does not report any blood in her stool she says all the blood was in her urine, supportive care she has improved, Per GI prob infectious- advance diet as tolerated   ? GI Bleed which turned out to be chronic hematuria - she denies any blood in stool says all the blood was in her urine which has now cleared, hematuria is mild and chronic and she has already gotten an appointment with urology outpatient which she will maintain, Coumadin   Mesenteric Vein thrombosis- Improving per CT scan, on  Coumadin - will transition heparin to lovenox for d/c  Note patient has been requested to followup with hematology oncology as outpatient due to her history of mesenteric vein thrombosis which was found a few months ago, she had been recommended to follow with hematology oncology for hypercoagulable workup which he has failed to do so far.- I gave patient the number and asked her to make an appointment today for 1-2 weeks I also called and left message for patient to get appointment   HTN- hold home meds  Alcohol Abuse and tobacco abuse. Counseled to quit both, no signs of withdrawal   Procedures:    Consultations:  GI  Discharge Exam: Filed Vitals:   06/25/13 1413 06/25/13 1600 06/25/13 2145 06/26/13 0531  BP: 113/58 118/62 95/56 115/60  Pulse: 55 60 63 72  Temp: 98.3 F (36.8 C)  98 F (36.7 C) 98.2 F (36.8 C)  TempSrc: Oral  Oral Oral  Resp: 15  16 16   Height:      Weight:      SpO2: 99%  98% 99%    General: pleasant/cooeprative Cardiovascular: rrr Respiratory: clear  Discharge Instructions      Discharge Orders   Future Orders Complete By Expires     Call MD for:  severe uncontrolled pain  As directed     Call MD for:  temperature >100.4  As directed     Diet - low sodium heart healthy  As directed     Discharge instructions  As directed  Comments:      Follow up with urology Follow up at the cancer center- [phone]- left message for appointment need to call and follow up for hypercoagulable work up Follow up with GI Get PT/INR check in AM (friday) at PCP- continue lovenox until d/c'd by PCP- very important to get checked as you are on antibiotics    Increase activity slowly  As directed         Medication List    STOP taking these medications       amLODipine 10 MG tablet  Commonly known as:  NORVASC     lisinopril 40 MG tablet  Commonly known as:  PRINIVIL,ZESTRIL      TAKE these medications       CALCIUM + D PO  Take 1 tablet by mouth  every evening.     ciprofloxacin 500 MG tablet  Commonly known as:  CIPRO  Take 1 tablet (500 mg total) by mouth 2 (two) times daily.     clonazePAM 2 MG tablet  Commonly known as:  KLONOPIN  Take 2 mg by mouth 2 (two) times daily as needed.     enoxaparin 60 MG/0.6ML injection  Commonly known as:  LOVENOX  Inject 0.55 mLs (55 mg total) into the skin every 12 (twelve) hours.     metroNIDAZOLE 500 MG tablet  Commonly known as:  FLAGYL  Take 1 tablet (500 mg total) by mouth 3 (three) times daily.     multivitamin with minerals Tabs  Take 1 tablet by mouth every evening.     oxyCODONE 5 MG immediate release tablet  Commonly known as:  Oxy IR/ROXICODONE  Take 1 tablet (5 mg total) by mouth every 6 (six) hours as needed for pain.     PARoxetine 40 MG tablet  Commonly known as:  PAXIL  Take 40 mg by mouth every morning.     propranolol 20 MG tablet  Commonly known as:  INDERAL  Take 1 tablet (20 mg total) by mouth 2 (two) times daily.     PROTONIX 40 MG tablet  Generic drug:  pantoprazole  Take 40 mg by mouth daily.     spironolactone 50 MG tablet  Commonly known as:  ALDACTONE  Take 1 tablet (50 mg total) by mouth daily.     warfarin 5 MG tablet  Commonly known as:  COUMADIN  Take 1 tablet (5 mg total) by mouth daily.       Allergies  Allergen Reactions  . Venlafaxine Hcl Other (See Comments)    Shortness of breath, irritable and mean   Follow-up Information   Follow up with BOUSKA,DAVID E, MD. Schedule an appointment as soon as possible for a visit in 4 days. (get INR,CBC,BMP checked)    Contact information:   [address] [place] [address] [phone]       Follow up with Freddy Jaksch, MD. Schedule an appointment as soon as possible for a visit in 1 week.   Contact information:   [address] [place] [address] [phone]       Follow up with Levert Feinstein, MD. Schedule an appointment as soon as possible for a  visit in 1 week.   Contact information:   [address] [place] [address] [phone]        The results of significant diagnostics from this hospitalization (including imaging, microbiology, ancillary and laboratory) are listed below for reference.    Significant Diagnostic Studies: Ct  Abdomen W Contrast  06/11/2013   *RADIOLOGY REPORT*  Clinical Data: Periumbilical abdominal pain, nausea and vomiting. History of colon cancer diagnosed 2009.  CT ABDOMEN WITH CONTRAST  Technique:  Multidetector CT imaging of the abdomen was performed following the standard protocol during bolus administration of intravenous contrast.  Contrast: 70mL OMNIPAQUE IOHEXOL 300 MG/ML  SOLN  Comparison: 05/22/2013 CT abdomen/pelvis  Findings: Lung bases are clear.  Esophageal varices are redemonstrated.  Apparent interval resolution of previously seen perihepatic ascites.  There has been interval reduction in nonocclusive portal venous thrombus extension to the superior mesenteric vein, for example image 29, now with more peripheral and linear orientation in keeping with at least subacute chronicity.  The liver is minimally inhomogeneous without new focal abnormality. Gallbladder, right kidney, and adrenal glands are normal.  Peripheral wedge-shaped inhomogeneous hypodensity at the superior portion of the spleen image 12 is noted.  There is opacification of the splenic vein but a small thrombus could be obscured due to technique.  Pancreatic head and uncinate process calcification could indicate previous pancreatitis.  The pancreas enhances homogeneously with minimal prominence of the duct at the level of the pancreatic head measuring 3 mm maximally.  3 mm too small to characterize left renal cortical hypodensity image 25 is stable.  Trace left upper quadrant stranding / fluid noted.  Possible injection site within the subcutaneous tissues of the left upper quadrant, image 26.  Degenerative changes are noted in the spine.   IMPRESSION: Reduction in the portal venous thrombosis clot burden with apparent evolution given its linear and more eccentrically positioned location within the portal vein and superior mesenteric vein.  Inhomogeneous enhancement at the superior margin of the spleen which has been prior exams and could represent hypo enhancement relative to focal thrombosis although this could be related to vascular congestion or phase of enhancement.  Sequela of probable previous pancreatitis.  No acute intra-abdominal abnormality.   Original Report Authenticated By: Christiana Pellant, M.D.   Ct Abdomen Pelvis W Contrast  06/23/2013   *RADIOLOGY REPORT*  Clinical Data: Abdominal pain for 4 days.  Blood in the urine.  CT ABDOMEN AND PELVIS WITH CONTRAST  Technique:  Multidetector CT imaging of the abdomen and pelvis was performed following the standard protocol during bolus administration of intravenous contrast.  Contrast: OMNIPAQUE IOHEXOL 300 MG/ML  SOLN  Comparison: 06/11/2013  Findings: Respiratory motion limits visualization of the lung bases.  There is thickening of the wall of the distal esophagus with contrast material in the esophagus suggesting reflux disease. Para esophageal varices are present.  Interval improvement of thrombosis in the mesenteric veins.  The liver, spleen, gallbladder, adrenal glands, kidneys, abdominal aorta, and inferior vena cava are unremarkable.  Calcification in the head of the pancreas probably due to old chronic pancreatitis. No significant pancreatic ductal dilatation.  Calcification of the abdominal aorta without aneurysm.  The stomach is not abnormally distended.  There is wall thickening in the ileum which could represent enteritis or other infiltrative process. Focal fluid in the mesentery.  Given the presence of mesenteric vein thrombosis, ischemia is not excluded.  There is no evidence of pneumatosis or portal venous gas.  No small bowel distension.  Stool filled colon without  evidence of wall thickening. No free air in the abdomen.  Pelvis:  Uterus appears to be surgically absent.  No abnormal adnexal masses.  Bladder wall is not thickened.  Stool filled rectosigmoid colon without diverticulitis.  Appendix is not identified.  No free or  loculated pelvic fluid collections.  No significant pelvic lymphadenopathy.  Degenerative changes in the lumbar spine.  Spondylolysis and spondylolisthesis at L5-S1.  IMPRESSION: Improving mesenteric vein thrombosis is demonstrated.  However, there is wall thickening in the ileum with interloop fluid collection.  This could represent enteritis although given the previous mesenteric thrombus, bowel ischemia is not excluded. Calcification in the pancreatic head suggesting chronic pancreatitis.  Changes suggesting the lower esophageal reflux disease with esophageal varices.   Original Report Authenticated By: Burman Nieves, M.D.    Microbiology: Recent Results (from the past 240 hour(s))  CLOSTRIDIUM DIFFICILE BY PCR     Status: None   Collection Time    06/24/13 10:09 AM      Result Value Range Status   C difficile by pcr NEGATIVE  NEGATIVE Final     Labs: Basic Metabolic Panel:  Recent Labs Lab 06/22/13 1945 06/23/13 0515 06/24/13 0910 06/25/13 0515 06/26/13 0420  NA 132* 132* 136 138 140  K 5.3* 5.0 4.4 4.0 3.8  CL 98 99 102 105 104  CO2 19 23 23 24 22   GLUCOSE 136* 103* 84 89 105*  BUN 21 18 12 9 7   CREATININE 1.20* 1.07 1.13* 1.04 1.11*  CALCIUM 10.1 8.9 9.3 8.8 9.4   Liver Function Tests:  Recent Labs Lab 06/22/13 1945  AST 57*  ALT 56*  ALKPHOS 283*  BILITOT 0.9  PROT 8.3  ALBUMIN 4.2    Recent Labs Lab 06/22/13 1945  LIPASE 134*   No results found for this basename: AMMONIA,  in the last 168 hours CBC:  Recent Labs Lab 06/22/13 1945 06/23/13 0515  06/23/13 1700 06/23/13 1933 06/24/13 0910 06/25/13 0515 06/26/13 0420  WBC 27.1* 12.1*  --   --   --  6.5 5.5 5.4  NEUTROABS 21.1*  --   --    --   --   --   --   --   HGB 19.2* 15.4*  < > 13.9 13.9 14.2 13.4 13.4  HCT 54.4* 44.6  < > 41.6 40.1 41.6 39.6 39.5  MCV 97.0 96.3  --   --   --  97.4 97.5 96.8  PLT 299 232  --   --   --  180 154 157  < > = values in this interval not displayed. Cardiac Enzymes: No results found for this basename: CKTOTAL, CKMB, CKMBINDEX, TROPONINI,  in the last 168 hours BNP: BNP (last 3 results) No results found for this basename: PROBNP,  in the last 8760 hours CBG: No results found for this basename: GLUCAP,  in the last 168 hours     Signed:  Marlin Canary  [facility] 06/26/2013, 1:08 PM

## 2013-06-26 NOTE — Progress Notes (Addendum)
ANTICOAGULATION CONSULT NOTE - Follow Up Consult  Pharmacy Consult for Lovenox/Warfarin + Zosyn Indication: mesenteric vein thrombosis  Allergies  Allergen Reactions  . Venlafaxine Hcl Other (See Comments)    Shortness of breath, irritable and mean    Patient Measurements: Height: 5\' 2"  (157.5 cm) Weight: 121 lb (54.885 kg) IBW/kg (Calculated) : 50.1 Heparin Dosing Weight: 55 kg  Vital Signs: Temp: 98.2 F (36.8 C) (07/10 0531) Temp src: Oral (07/10 0531) BP: 115/60 mmHg (07/10 0531) Pulse Rate: 72 (07/10 0531)  Labs:  Recent Labs  06/23/13 2200 06/24/13 0910 06/25/13 0515 06/26/13 0420  HGB  --  14.2 13.4 13.4  HCT  --  41.6 39.6 39.5  PLT  --  180 154 157  LABPROT  --  15.1 17.8* 20.1*  INR  --  1.22 1.51* 1.77*  HEPARINUNFRC 0.48 0.35 0.57  --   CREATININE  --  1.13* 1.04 1.11*    Estimated Creatinine Clearance: 43.7 ml/min (by C-G formula based on Cr of 1.11).   Medications:  Prescriptions prior to admission  Medication Sig Dispense Refill  . amLODipine (NORVASC) 10 MG tablet Take 1 tablet (10 mg total) by mouth daily.  30 tablet  0  . Calcium Carbonate-Vitamin D (CALCIUM + D PO) Take 1 tablet by mouth every evening.      . clonazePAM (KLONOPIN) 2 MG tablet Take 2 mg by mouth 2 (two) times daily as needed.       Marland Kitchen lisinopril (PRINIVIL,ZESTRIL) 40 MG tablet Take 40 mg by mouth daily.      . Multiple Vitamin (MULTIVITAMIN WITH MINERALS) TABS Take 1 tablet by mouth every evening.      Marland Kitchen oxyCODONE (OXY IR/ROXICODONE) 5 MG immediate release tablet Take 1 tablet (5 mg total) by mouth every 6 (six) hours as needed for pain.  30 tablet  0  . pantoprazole (PROTONIX) 40 MG tablet Take 40 mg by mouth daily.      Marland Kitchen PARoxetine (PAXIL) 40 MG tablet Take 40 mg by mouth every morning.      . propranolol (INDERAL) 20 MG tablet Take 1 tablet (20 mg total) by mouth 2 (two) times daily.  60 tablet  0  . spironolactone (ALDACTONE) 50 MG tablet Take 1 tablet (50 mg total) by  mouth daily.  30 tablet  0    Assessment:  57yo female admitted with mesenteric vein thrombosis, which is improve per CT scan. Pt was switched from heparin to lovenox (7/9). Patient took Warfarin 5mg  (7/9) and her INR is trending up at 1.77 this morning with a goal of 2.0-3.0. Her CBC and platelets are all stable. Per nurses note this morning, patient continues to bleed in her urine. Patient was educated (7/9).  ID: Acute colitis: afebrile, WBC 5.4, SrCr 1.11 stable. C. Diff negative. On day 4 of antibiotics.   Goal of Therapy:  INR 2-3  Monitor platelets by anticoagulation protocol: Yes   Plan:  1. Continue lovenox SQ 1mg /kg (55mg ) q12h 2. Continue Warfarin 5 mg po daily 3. Check CBC, INR, and s/s of bleed 4. Zosyn 3.375g IV q8h, no change. Allena Katz, Adler Alton M 06/26/2013,10:02 AM

## 2013-06-26 NOTE — Progress Notes (Signed)
Discharge instructions reviewed with pt and prescriptions given.  Pt received Lovenox teaching kit and demonstrated administering Lovenox injection without difficulty.  Pt verbalized understanding and had no questions.  Pt discharged in stable condition via wheelchair with husband.  Laura Farley

## 2013-06-30 ENCOUNTER — Inpatient Hospital Stay (HOSPITAL_COMMUNITY)
Admission: AD | Admit: 2013-06-30 | Discharge: 2013-07-04 | DRG: 377 | Disposition: A | Discharge status: Home / Self Care | Payer: Medicare Other | Source: Ambulatory Visit | Attending: Internal Medicine | Admitting: Internal Medicine

## 2013-06-30 ENCOUNTER — Encounter (HOSPITAL_COMMUNITY): Payer: Self-pay | Admitting: *Deleted

## 2013-06-30 DIAGNOSIS — R109 Unspecified abdominal pain: Secondary | ICD-10-CM

## 2013-06-30 DIAGNOSIS — D62 Acute posthemorrhagic anemia: Secondary | ICD-10-CM | POA: Diagnosis present

## 2013-06-30 DIAGNOSIS — K55069 Acute infarction of intestine, part and extent unspecified: Secondary | ICD-10-CM | POA: Diagnosis present

## 2013-06-30 DIAGNOSIS — F411 Generalized anxiety disorder: Secondary | ICD-10-CM | POA: Diagnosis present

## 2013-06-30 DIAGNOSIS — T45511A Poisoning by anticoagulants, accidental (unintentional), initial encounter: Secondary | ICD-10-CM

## 2013-06-30 DIAGNOSIS — F419 Anxiety disorder, unspecified: Secondary | ICD-10-CM | POA: Diagnosis present

## 2013-06-30 DIAGNOSIS — R578 Other shock: Secondary | ICD-10-CM | POA: Diagnosis present

## 2013-06-30 DIAGNOSIS — K55059 Acute (reversible) ischemia of intestine, part and extent unspecified: Secondary | ICD-10-CM | POA: Diagnosis present

## 2013-06-30 DIAGNOSIS — I959 Hypotension, unspecified: Secondary | ICD-10-CM

## 2013-06-30 DIAGNOSIS — K922 Gastrointestinal hemorrhage, unspecified: Secondary | ICD-10-CM

## 2013-06-30 DIAGNOSIS — D751 Secondary polycythemia: Secondary | ICD-10-CM

## 2013-06-30 DIAGNOSIS — F1010 Alcohol abuse, uncomplicated: Secondary | ICD-10-CM

## 2013-06-30 DIAGNOSIS — K921 Melena: Principal | ICD-10-CM | POA: Diagnosis present

## 2013-06-30 DIAGNOSIS — Z720 Tobacco use: Secondary | ICD-10-CM

## 2013-06-30 DIAGNOSIS — T45515A Adverse effect of anticoagulants, initial encounter: Secondary | ICD-10-CM | POA: Diagnosis present

## 2013-06-30 DIAGNOSIS — K766 Portal hypertension: Secondary | ICD-10-CM | POA: Diagnosis present

## 2013-06-30 DIAGNOSIS — F1011 Alcohol abuse, in remission: Secondary | ICD-10-CM | POA: Diagnosis present

## 2013-06-30 DIAGNOSIS — K319 Disease of stomach and duodenum, unspecified: Secondary | ICD-10-CM | POA: Diagnosis present

## 2013-06-30 DIAGNOSIS — K449 Diaphragmatic hernia without obstruction or gangrene: Secondary | ICD-10-CM | POA: Diagnosis present

## 2013-06-30 DIAGNOSIS — D72829 Elevated white blood cell count, unspecified: Secondary | ICD-10-CM | POA: Diagnosis present

## 2013-06-30 DIAGNOSIS — Z85038 Personal history of other malignant neoplasm of large intestine: Secondary | ICD-10-CM

## 2013-06-30 DIAGNOSIS — K859 Acute pancreatitis without necrosis or infection, unspecified: Secondary | ICD-10-CM

## 2013-06-30 DIAGNOSIS — R791 Abnormal coagulation profile: Secondary | ICD-10-CM | POA: Diagnosis present

## 2013-06-30 DIAGNOSIS — R310 Gross hematuria: Secondary | ICD-10-CM | POA: Diagnosis present

## 2013-06-30 DIAGNOSIS — N179 Acute kidney failure, unspecified: Secondary | ICD-10-CM | POA: Diagnosis present

## 2013-06-30 DIAGNOSIS — I10 Essential (primary) hypertension: Secondary | ICD-10-CM

## 2013-06-30 DIAGNOSIS — R197 Diarrhea, unspecified: Secondary | ICD-10-CM

## 2013-06-30 DIAGNOSIS — T458X1A Poisoning by other primarily systemic and hematological agents, accidental (unintentional), initial encounter: Secondary | ICD-10-CM

## 2013-06-30 DIAGNOSIS — Z9049 Acquired absence of other specified parts of digestive tract: Secondary | ICD-10-CM

## 2013-06-30 DIAGNOSIS — F17200 Nicotine dependence, unspecified, uncomplicated: Secondary | ICD-10-CM | POA: Diagnosis present

## 2013-06-30 DIAGNOSIS — I8510 Secondary esophageal varices without bleeding: Secondary | ICD-10-CM | POA: Diagnosis present

## 2013-06-30 DIAGNOSIS — C189 Malignant neoplasm of colon, unspecified: Secondary | ICD-10-CM

## 2013-06-30 DIAGNOSIS — R188 Other ascites: Secondary | ICD-10-CM

## 2013-06-30 DIAGNOSIS — D6832 Hemorrhagic disorder due to extrinsic circulating anticoagulants: Secondary | ICD-10-CM

## 2013-06-30 DIAGNOSIS — R319 Hematuria, unspecified: Secondary | ICD-10-CM | POA: Diagnosis present

## 2013-06-30 DIAGNOSIS — K5289 Other specified noninfective gastroenteritis and colitis: Secondary | ICD-10-CM | POA: Diagnosis present

## 2013-06-30 DIAGNOSIS — K920 Hematemesis: Secondary | ICD-10-CM | POA: Diagnosis present

## 2013-06-30 DIAGNOSIS — K625 Hemorrhage of anus and rectum: Secondary | ICD-10-CM | POA: Diagnosis present

## 2013-06-30 LAB — CBC WITH DIFFERENTIAL/PLATELET
Basophils Absolute: 0.1 K/uL (ref 0.0–0.1)
Basophils Relative: 0 % (ref 0–1)
Eosinophils Absolute: 0.3 K/uL (ref 0.0–0.7)
Eosinophils Relative: 2 % (ref 0–5)
HCT: 31.5 % — ABNORMAL LOW (ref 36.0–46.0)
Hemoglobin: 10.6 g/dL — ABNORMAL LOW (ref 12.0–15.0)
Lymphocytes Relative: 21 % (ref 12–46)
Lymphs Abs: 3.1 K/uL (ref 0.7–4.0)
MCH: 32.2 pg (ref 26.0–34.0)
MCHC: 33.7 g/dL (ref 30.0–36.0)
MCV: 95.7 fL (ref 78.0–100.0)
Monocytes Absolute: 2.2 K/uL — ABNORMAL HIGH (ref 0.1–1.0)
Monocytes Relative: 15 % — ABNORMAL HIGH (ref 3–12)
Neutro Abs: 9.4 K/uL — ABNORMAL HIGH (ref 1.7–7.7)
Neutrophils Relative %: 62 % (ref 43–77)
Platelets: 312 K/uL (ref 150–400)
RBC: 3.29 MIL/uL — ABNORMAL LOW (ref 3.87–5.11)
RDW: 15.2 % (ref 11.5–15.5)
WBC: 15.2 K/uL — ABNORMAL HIGH (ref 4.0–10.5)

## 2013-06-30 LAB — COMPREHENSIVE METABOLIC PANEL
Albumin: 3.1 g/dL — ABNORMAL LOW (ref 3.5–5.2)
Alkaline Phosphatase: 130 U/L — ABNORMAL HIGH (ref 39–117)
BUN: 54 mg/dL — ABNORMAL HIGH (ref 6–23)
CO2: 29 mEq/L (ref 19–32)
Chloride: 87 mEq/L — ABNORMAL LOW (ref 96–112)
Creatinine, Ser: 6.85 mg/dL — ABNORMAL HIGH (ref 0.50–1.10)
GFR calc non Af Amer: 6 mL/min — ABNORMAL LOW (ref >90)
Potassium: 3.8 mEq/L (ref 3.5–5.1)
Total Bilirubin: 0.3 mg/dL (ref 0.3–1.2)

## 2013-06-30 LAB — PROTIME-INR
INR: 4.63 — ABNORMAL HIGH (ref 0.00–1.49)
Prothrombin Time: 41.9 s — ABNORMAL HIGH (ref 11.6–15.2)

## 2013-06-30 LAB — CBC
Hemoglobin: 10.4 g/dL — ABNORMAL LOW (ref 12.0–15.0)
MCH: 32.4 pg (ref 26.0–34.0)
MCHC: 33.7 g/dL (ref 30.0–36.0)
Platelets: 314 10*3/uL (ref 150–400)
Platelets: 182 10*3/uL (ref 150–400)
RBC: 2.13 MIL/uL — ABNORMAL LOW (ref 3.87–5.11)
RDW: 15.2 % (ref 11.5–15.5)
RDW: 15.3 % (ref 11.5–15.5)
WBC: 11 10*3/uL — ABNORMAL HIGH (ref 4.0–10.5)

## 2013-06-30 LAB — HAPTOGLOBIN: Haptoglobin: 80 mg/dL (ref 45–215)

## 2013-06-30 LAB — PREPARE RBC (CROSSMATCH)

## 2013-06-30 LAB — MRSA PCR SCREENING: MRSA by PCR: NEGATIVE

## 2013-06-30 LAB — LACTATE DEHYDROGENASE: LDH: 215 U/L (ref 94–250)

## 2013-06-30 LAB — FIBRINOGEN: Fibrinogen: 393 mg/dL (ref 204–475)

## 2013-06-30 LAB — RETICULOCYTES
Retic Count, Absolute: 51.4 10*3/uL (ref 19.0–186.0)
Retic Ct Pct: 1.6 % (ref 0.4–3.1)

## 2013-06-30 MED ORDER — SODIUM CHLORIDE 0.9 % IV BOLUS (SEPSIS)
1000.0000 mL | Freq: Once | INTRAVENOUS | Status: AC
  Administered 2013-06-30: 1000 mL via INTRAVENOUS

## 2013-06-30 MED ORDER — CIPROFLOXACIN IN D5W 400 MG/200ML IV SOLN
400.0000 mg | INTRAVENOUS | Status: DC
  Administered 2013-07-01: 400 mg via INTRAVENOUS
  Filled 2013-06-30 (×2): qty 200

## 2013-06-30 MED ORDER — ONDANSETRON HCL 4 MG/2ML IJ SOLN
INTRAMUSCULAR | Status: AC
  Administered 2013-06-30: 4 mg
  Filled 2013-06-30: qty 2

## 2013-06-30 MED ORDER — ONDANSETRON HCL 4 MG/2ML IJ SOLN
4.0000 mg | Freq: Once | INTRAMUSCULAR | Status: AC
  Administered 2013-06-30: 4 mg via INTRAVENOUS

## 2013-06-30 MED ORDER — SODIUM CHLORIDE 0.9 % IV SOLN: INTRAVENOUS | Status: DC

## 2013-06-30 MED ORDER — ONDANSETRON HCL 4 MG PO TABS: 4.0000 mg | ORAL_TABLET | Freq: Four times a day (QID) | ORAL | Status: DC | PRN

## 2013-06-30 MED ORDER — PANTOPRAZOLE SODIUM 40 MG IV SOLR
40.0000 mg | INTRAVENOUS | Status: DC
  Administered 2013-07-01: 40 mg via INTRAVENOUS
  Filled 2013-06-30 (×3): qty 40

## 2013-06-30 MED ORDER — ONDANSETRON HCL 4 MG/2ML IJ SOLN
4.0000 mg | Freq: Four times a day (QID) | INTRAMUSCULAR | Status: DC | PRN
  Administered 2013-06-30 – 2013-07-01 (×2): 4 mg via INTRAVENOUS
  Filled 2013-06-30 (×3): qty 2

## 2013-06-30 MED ORDER — SODIUM CHLORIDE 0.9 % IV BOLUS (SEPSIS): 250.0000 mL | Freq: Once | INTRAVENOUS | Status: DC

## 2013-06-30 MED ORDER — SODIUM CHLORIDE 0.9 % IJ SOLN
3.0000 mL | Freq: Two times a day (BID) | INTRAMUSCULAR | Status: DC
  Administered 2013-07-01 – 2013-07-04 (×3): 3 mL via INTRAVENOUS

## 2013-06-30 MED ORDER — SODIUM CHLORIDE 0.9 % IV SOLN
INTRAVENOUS | Status: DC
  Administered 2013-06-30: 18:00:00 via INTRAVENOUS
  Administered 2013-07-01 (×2): 1000 mL via INTRAVENOUS
  Administered 2013-07-03: 75 mL via INTRAVENOUS
  Administered 2013-07-04: 01:00:00 via INTRAVENOUS

## 2013-06-30 MED ORDER — METRONIDAZOLE IN NACL 5-0.79 MG/ML-% IV SOLN
500.0000 mg | Freq: Three times a day (TID) | INTRAVENOUS | Status: DC
  Administered 2013-07-01 (×3): 500 mg via INTRAVENOUS
  Filled 2013-06-30 (×6): qty 100

## 2013-06-30 NOTE — H&P (Signed)
[facility] History and Physical  YOUNG BRIM ZOX:[id number] DOB: 03-Oct-1956 DOA: 06/30/2013  Referring physician: Dr. Wilson Singer PCP: Aura Dials, MD  Specialists: Dr. Wilson Singer with Urology  Chief Complaint: blood in urine, bright blood in stool, and hematemesis  HPI: Laura Farley is a 57 y.o. 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  She presented as a direct admit after she was found to have persistent hematuria and elevated INR of 4.1.  Patient reports that this am she has vomited blood x 3 times.  While initially on the floor nursing reported that patient had another bout of hematemesis and BRBPR.  While in the office Dr. Wilson Singer reported patient was hypotensive but on last BP check at office she was normotensive but on the soft side ~ 90/60's.  Initial blood pressure on floor was 80/63 and fluid bolus ordered as well as orders for transfer to stepdown unit.  Repeat cbc showed hemoglobin of 10.6 prior hgb was 13.4 on 06/26/13  Given her coagulopathy, hypotension, and active bleeding we were consulted for admission evaluation and recommendations.  Per prior d/c summary patient on last admission was being treated for Acute colitis thought to be 2ary to infectious etiology for which patient was taking antibiotics.  Review of Systems: 10 point review of system reviewed and negative unless otherwise mentioned above.  Past Medical History  Diagnosis Date  . Hypertension   . Tobacco use disorder   . Seizures     withdraw from xanax  . Cancer     Right colon cancer T3  . Colon cancer   . Neuromuscular disorder   . Colon cancer   . Uterine cancer    Past Surgical History  Procedure Laterality Date  . Colon surgery  2006  . Abdominal hysterectomy  1988    cancer  . Broken arm  2012    left  . Clavicle surgery  2012    left  . Tonsillectomy    . Tunneled venous port placement  2006   . Port-a-cath removal  10/31/2011    Procedure: REMOVAL PORT-A-CATH;  Surgeon: Atilano Ina, MD;  Location: [facility];  Service: General;  Laterality: N/A;  port-a-cath removal  . Esophagogastroduodenoscopy  02/10/2012    Procedure: ESOPHAGOGASTRODUODENOSCOPY (EGD);  Surgeon: Theda Belfast, MD;  Location: [facility] END[facility]COPY;  Service: Endoscopy;  Laterality: N/A;   Social History:  reports that she has been smoking.  She has never used smokeless tobacco. She reports that she does not drink alcohol or use illicit drugs. Lives at home with her husband  Can patient participate in ADLs? yes  Allergies  Allergen Reactions  . Venlafaxine Hcl Other (See Comments)    Shortness of breath, irritable and mean    Family History  Problem Relation Age of Onset  . Cancer Father     prostate  . Hypertension Sister      X  2  . Hypertension Brother   No family history of blood disorder reported when directly asked  Prior to Admission medications   Medication Sig Start Date End Date Taking? Authorizing Provider  amoxicillin (AMOXIL) 500 MG capsule Take 500 mg by mouth 3 (three) times daily.   Yes Historical Provider, MD  Calcium Carbonate-Vitamin D (CALCIUM + D PO) Take 1 tablet by mouth every evening.   Yes Historical Provider, MD  ciprofloxacin (CIPRO) 500 MG tablet Take 1 tablet (500  mg total) by mouth 2 (two) times daily. 06/26/13  Yes Joseph Art, DO  clonazePAM (KLONOPIN) 2 MG tablet Take 2 mg by mouth 2 (two) times daily as needed for anxiety.    Yes Historical Provider, MD  enoxaparin (LOVENOX) 60 MG/0.6ML injection Inject 0.55 mLs (55 mg total) into the skin every 12 (twelve) hours. 06/26/13  Yes Joseph Art, DO  metroNIDAZOLE (FLAGYL) 500 MG tablet Take 1 tablet (500 mg total) by mouth 3 (three) times daily. 06/26/13  Yes Joseph Art, DO  oxyCODONE (OXY IR/ROXICODONE) 5 MG immediate release tablet Take 1 tablet (5 mg total) by mouth every 6 (six) hours as needed for pain.  05/25/13  Yes Shanker Levora Dredge, MD  pantoprazole (PROTONIX) 40 MG tablet Take 40 mg by mouth daily.   Yes Historical Provider, MD  PARoxetine (PAXIL) 40 MG tablet Take 40 mg by mouth every morning.   Yes Historical Provider, MD  propranolol (INDERAL) 20 MG tablet Take 1 tablet (20 mg total) by mouth 2 (two) times daily. 05/25/13  Yes Shanker Levora Dredge, MD  spironolactone (ALDACTONE) 50 MG tablet Take 1 tablet (50 mg total) by mouth daily. 05/25/13  Yes Shanker Levora Dredge, MD  warfarin (COUMADIN) 5 MG tablet Take 1 tablet (5 mg total) by mouth daily. 06/26/13  Yes Joseph Art, DO   Physical Exam: Filed Vitals:   06/30/13 1530  BP: 80/63  Pulse: 78  Temp: 98.6 F (37 C)  TempSrc: Oral  Resp: 16  Height: 5\' 1"  (1.549 m)  Weight: 54.522 kg (120 lb 3.2 oz)  SpO2: 98%     General:  Pt in NAD, laying in bed Alert and Awake  Eyes: EOMI, non icteric  ENT: normal exterior appearance, dry mucous membranes  Neck: supple, no goiter  Cardiovascular: RRR, no MRG  Respiratory: CTA BL, no wheezes  Abdomen: soft, NT, ND  Skin: warm and dry, multiple bruises diffusely at arms abdomen and lower extremities  Musculoskeletal: no cyanosis or clubbing  Psychiatric: mood and affect appropriate  Neurologic: Alert and Oriented x 4, answers questions appropriately and moves all extremities  Labs on Admission:  Basic Metabolic Panel:  Recent Labs Lab 06/24/13 0910 06/25/13 0515 06/26/13 0420  NA 136 138 140  K 4.4 4.0 3.8  CL 102 105 104  CO2 23 24 22   GLUCOSE 84 89 105*  BUN 12 9 7   CREATININE 1.13* 1.04 1.11*  CALCIUM 9.3 8.8 9.4   Liver Function Tests: No results found for this basename: AST, ALT, ALKPHOS, BILITOT, PROT, ALBUMIN,  in the last 168 hours No results found for this basename: LIPASE, AMYLASE,  in the last 168 hours No results found for this basename: AMMONIA,  in the last 168 hours CBC:  Recent Labs Lab 06/23/13 1933 06/24/13 0910 06/25/13 0515 06/26/13 0420  06/30/13 1355  WBC  --  6.5 5.5 5.4 15.2*  NEUTROABS  --   --   --   --  9.4*  HGB 13.9 14.2 13.4 13.4 10.6*  HCT 40.1 41.6 39.6 39.5 31.5*  MCV  --  97.4 97.5 96.8 95.7  PLT  --  180 154 157 312   Cardiac Enzymes: No results found for this basename: CKTOTAL, CKMB, CKMBINDEX, TROPONINI,  in the last 168 hours  BNP (last 3 results) No results found for this basename: PROBNP,  in the last 8760 hours CBG: No results found for this basename: GLUCAP,  in the last 168 hours  Radiological Exams  on Admission: No results found.   Assessment/Plan Active Problems:  1. Warfarin induced coagulopathy - Patient was recently started on coumadin question whether patient was following up with her pcp for proper monitoring.   - Currently INR elevated at 4.63 - Discussed with Oncologist/hematologist Dr. Karel Jarvis and at this juncture plan is to transfuse FFP.  Should patient continue to bleed then he would recommend Vitamin K - Hold coumadin and lovenox given hematuria, BRBPR, and hematemesis  - Transfuse 2 units of FFP Stat, have discussed with nursing to help expedite this process.  2. Hematuria/BRBPR/hematemesis - At this point presumed to be 2ary to # 1 - Please see above for plans - Agree patient will need further work up for her hematuria. Discussed with Dr. Annabell Howells and if hematuria persists after warfarin reversal patient may require angiography and IR consultation  3. Anemia due to blood loss - Patient looks dry. Once intravascular space expanded with IVF I suspect H/H value will come back more decreased that it is now. - will monitor cbc's q 6 hours.  - transfuse if hgb 8.0 or less with active bleeding  4. Hypotension - suspect intravascular depletion given poor oral intake and emesis. - Administer fluid bolus 1 L of normal saline - Hold blood pressure medications.   5. Mesenteric vein thrombosis - active bleeding currently threatening patient's well being and as such will hold lovenox  and coumadin.   6. Anxiety - Patient will be npo due to # 1 as such will hold Paxil for now. - will want to avoid abrupt withdrawal and as such will plan on continuing once pt able to take po.  7. ARF - Could be 2ary to hypotension and or intravascular depletion - Will plan on providing IVF rehydration - Monitor S creatinine - Consulted nephrology for further evaluation and recommendations.  8. Colitis - Patient was discharged on 06/26/13 and at that time per note required 5 more days of antibiotic therapy. - Will continue cipro and flagyl at this juncture.   Code Status: full Family Communication:  Will discuss with patient and spouse Disposition Plan:   Time spent: > 70 minutes of critical care time  Penny Pia [facility] Pager [phone]  If 7PM-7AM, please contact night-coverage [email or web address] Password [facility] 06/30/2013, 4:32 PM

## 2013-06-30 NOTE — Consult Note (Signed)
ctive Problems Problems  1. Coagulation Defects 286.9 2. Gross Hematuria 599.71  History of Present Illness     Laura Farley is a 56yo WF who is sent in consultation for gross hematuria.  She has a complicated medical history with colon cancer treated in 1997 with surgery and chemo but no radiation, alcohol abuse with cirrhosis and esophageal varicies.  She has a recent history of mesenteric vein thrombosis and is on both coumadin and lovenox.  She now reports a 2 week history of gross hematuria.  She has had several recent CT scans for a bout of enterocolitis but her kidneys and bladder were unremarkable on the scans.  She has mid abdominal pain but no dysuria, irritative voiding symptoms or flank pain today.   I have reviewed extensive records, imaging and labs in EPIC and her Hgb was 13.4 on 7/10.  She has a Cr of 1.1.  Her PT was 20.1 with an INR of 1.77.   She has not had a history of UTI's, stones or prior bleeding.   She has not had a culture but had only TNTC RBC's on UA and is on cipro currently.   She is hypotensive today at 85/60 and an initial level of 66/39.   He repeat labs from this morning showed a PT of 39.2 and an INR of 4.35.   Past Medical History Problems  1. History of  Abdominal Pain 789.00 2. History of  Anxiety (Symptom) 300.00 3. History of  Breast Pain 611.71 4. History of  Cirrhosis 571.5 5. History of  Colon Cancer V10.05 6. History of  Depression With Anxiety 300.4 7. History of  Esophageal Reflux 530.81 8. History of  Esophageal Varices 456.1 9. History of  Hematuria 599.70 10. History of  Hypertension 401.9 11. History of  Nicotine Dependence 305.1 12. History of  Seizure 13. History of  Venous Thrombosis V12.51  Surgical History Problems  1. History of  Colon Surgery 2. History of  Hysterectomy V45.77  Current Meds 1. AmLODIPine Besylate 10 MG Oral Tablet; Therapy: (Recorded:14Jul2014) to 2. Amoxicillin 500 MG Oral Capsule; Therapy: (Recorded:14Jul2014)  to 3. Calcium + D TABS; Therapy: (Recorded:03Jul2014) to 4. Ciprofloxacin HCl 500 MG Oral Tablet; Therapy: (Recorded:14Jul2014) to 5. ClonazePAM 2 MG Oral Tablet; Therapy: (Recorded:03Jul2014) to 6. Coumadin 5 MG Oral Tablet; Therapy: (Recorded:03Jul2014) to 7. Dexilant 60 MG Oral Capsule Delayed Release; Therapy: (Recorded:03Jul2014) to 8. Enoxaparin Sodium 60 MG/0.6ML Subcutaneous Solution; Therapy: 07Jun2014 to 9. Gabapentin 300 MG Oral Capsule; Therapy: (Recorded:03Jul2014) to 10. Lisinopril 40 MG Oral Tablet; Therapy: (Recorded:14Jul2014) to 11. MetroNIDAZOLE 500 MG Oral Tablet; Therapy: (Recorded:14Jul2014) to 12. OxyCODONE HCl 5 MG Oral Tablet; Therapy: (Recorded:03Jul2014) to 13. Pantoprazole Sodium 40 MG Oral Tablet Delayed Release; Therapy: (Recorded:03Jul2014) to 14. PARoxetine HCl 40 MG Oral Tablet; Therapy: (Recorded:14Jul2014) to 15. Paxil 40 MG Oral Tablet; Therapy: (Recorded:03Jul2014) to 16. ProAir HFA 108 (90 Base) MCG/ACT Inhalation Aerosol Solution; Therapy: (Recorded:03Jul2014)   to 17. Propranolol HCl 20 MG Oral Tablet; Therapy: (Recorded:03Jul2014) to 18. Spironolactone 50 MG Oral Tablet; Therapy: (Recorded:03Jul2014) to 19. Warfarin Sodium 5 MG Oral Tablet; takes 1 tablet daily; Therapy: (Recorded:14Jul2014) to  Allergies Medication  1. No Known Drug Allergies  Family History Problems  1. Family history of  Death In The Family Father age 88 of infection in teeth 2. Family history of  Death In The Family Mother age 74 of staph infectin 3. Family history of  Family Health Status Number Of Children 1 son   1 daughter    Social History Problems    Caffeine Use   Occupation: housewife   Recovering Alcoholic   Tobacco Use 305.1 1/2 ppd x 30 years Denied    History of  Alcohol Use  Review of Systems Genitourinary, constitutional, skin, eye, otolaryngeal, hematologic/lymphatic, cardiovascular, pulmonary, endocrine, musculoskeletal, gastrointestinal,  neurological and psychiatric system(s) were reviewed and pertinent findings if present are noted.  Genitourinary: urinary frequency, urinary urgency, nocturia, incontinence and hematuria.  Gastrointestinal: nausea, vomiting, heartburn and diarrhea.  Constitutional: night sweats and feeling tired (fatigue).  Integumentary: skin rash/lesion.  Hematologic/Lymphatic: a tendency to easily bruise.  Endocrine: polydipsia.  Musculoskeletal: back pain.  Neurological: dizziness and headache.  Psychiatric: depression and anxiety.    Vitals Vital Signs [Data Includes: Last 1 Day]  14Jul2014 02:37PM  Blood Pressure: 94 / 69 Temperature: 98.5 F Heart Rate: 81 14Jul2014 01:30PM  Blood Pressure: 85 / 60 Temperature: 98.1 F 14Jul2014 12:58PM  BMI Calculated: 22.37 BSA Calculated: 1.53 Weight: 120 lb  Blood Pressure: 66 / 39 Temperature: 98.1 F Heart Rate: 75 14Jul2014 12:57PM  Height: 5 ft 1.5 in  Physical Exam Constitutional: Well nourished and well developed . No acute distress.  ENT:. The ears and nose are normal in appearance.  Neck: The appearance of the neck is normal and no neck mass is present.  Pulmonary: No respiratory distress and normal respiratory rhythm and effort.  Cardiovascular: Heart rate and rhythm are normal . No peripheral edema.  Abdomen: The abdomen is soft and nontender. No masses are palpated. No CVA tenderness. No hernias are palpable. No hepatosplenomegaly noted.  Genitourinary:   Examination of the external genitalia shows vulvar atrophy. The urethra is is stenotic. Vaginal exam demonstrates atrophy.  Lymphatics: The supraclavicular, axillary, femoral and inguinal nodes are not enlarged or tender.  Skin: Normal skin turgor and no visible rash . Multiple bruises.  Neuro/Psych:. Mood and affect are appropriate. Normal sensation of the perineum/perianal region (S3,4,5).    Results/Data Urine [Data Includes: Last 1 Day]   14Jul2014  COLOR RED   APPEARANCE TURBID    SQUAMOUS EPITHELIAL/HPF NONE SEEN   WBC NONE SEEN WBC/hpf  RBC TNTC RBC/hpf  BACTERIA NONE SEEN   CRYSTALS NONE SEEN   CASTS NONE SEEN    Old records or history reviewed: I have reviewed extensive notes in epic including recent hospital notes.  The following images/tracing/specimen were independently visualized:  I have reviewed several CT films.  The following clinical lab reports were reviewed:  I have reviewed her recent labs.    Procedure  Procedure: Cystoscopy  Chaperone Present: Sue James.  Indication: Hematuria.  Prep: The patient was prepped with betadine.  Procedure Note:  Urethral meatus:. 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  Anterior urethra: No abnormalities.  Bladder: The right ureteral orifice was in the normal anatomic position and had bloody efflux. The left ureteral orifice was in the normal anatomic position and had clear efflux of urine. A systematic survey of the bladder demonstrated no bladder tumors or stones. The mucosa was smooth without abnormalities. Examination of the bladder demonstrated a small amount of fresh clot within the bladder (with brisk bleeding from the right UO. ). The patient tolerated the procedure well.  Complications: None.    Assessment Assessed  1. Gross Hematuria 599.71 2. Coagulation Defects 286.9   She has gross hematuria from the right upper tract.   The CT is reports as normal, but there maybe mild enhancement of the renal pelvis.     Plan Gross Hematuria (599.71)  1. URINE CULTURE  Done: 14Jul2014      She needs to be admitted to the hospital for medical management and cessation of anticoagulation. She may need angiography to see if that will clarify the source of bleeding,  If the bleeding stops with cessation of anticoagulation, she will need ureteroscopy for diagnosis.  I have contacted [facility] and will have them admit her when she gets to the hospital and I will see  in consultation.   Discussion/Summary  CC: Perry Jones PA at [place].   

## 2013-06-30 NOTE — Progress Notes (Addendum)
Shift event: RN paged this NP secondary to pt's BP being soft, continued hematuria and lowered Hgb. Hgb has fallen 3 grams. NP to bedside. S: Laura Farley feels weak. Abd pain on the right side, not new. No chest pain or SOB. No melana per RN.  O: VS reviewed. BP 70s systolic. O2 sats normal. Pt appears acutely ill but not toxic. Card: RRR. Resp: normal effort. Abd: BS +.  A/P: 1. Anemia secondary to hematuria (active bleeding) thought to be caused by coagulopathy from Coumadin. Hgb down to 7.2, so will transfuse 2U. Last INR in the 4's and 2 U FFP were given today. Recheck INR stat and give FFP and/or Vit K as needed. Continue serial CBCs. Anticoags are on hold.  2. Hypotension secondary to #1. NS bolus and increase IVF to 150/hr.  3. Chronic AC with coagulopathy-as per #1.  4. Hematuria-s/p cystoscopy per urology and they continue to follow. Update: Pt's BP a little better since bolus. Had black stool per RN. INR down to 2.04 and Vit K 1mg  given. Still with hematuria. Hgb down to 6.3, but before blood started. In process of 2 Units PRBC transfusion and will have CBC after that. Will cont to follow. Jimmye Norman, NP [facility]

## 2013-06-30 NOTE — Progress Notes (Signed)
ANTIBIOTIC CONSULT NOTE - INITIAL  Pharmacy Consult for Ciprofloxacin Indication: Colitis  Allergies  Allergen Reactions  . Venlafaxine Hcl Other (See Comments)    Shortness of breath, irritable and mean    Patient Measurements: Height: 5\' 1"  (154.9 cm) Weight: 125 lb 14.4 oz (57.108 kg) IBW/kg (Calculated) : 47.8 Adjusted Body Weight:   Vital Signs: Temp: 98.8 F (37.1 C) (07/14 1717) Temp src: Oral (07/14 1638) BP: 109/43 mmHg (07/14 1717) Pulse Rate: 71 (07/14 1717) Intake/Output from previous day:   Intake/Output from this shift:    Labs:  Recent Labs  06/30/13 1355  WBC 15.2*  HGB 10.6*  PLT 312  CREATININE 6.85*   Estimated Creatinine Clearance: 6.8 ml/min (by C-G formula based on Cr of 6.85). No results found for this basename: VANCOTROUGH, Leodis Binet, VANCORANDOM, GENTTROUGH, GENTPEAK, GENTRANDOM, TOBRATROUGH, TOBRAPEAK, TOBRARND, AMIKACINPEAK, AMIKACINTROU, AMIKACIN,  in the last 72 hours   Microbiology: Recent Results (from the past 720 hour(s))  CLOSTRIDIUM DIFFICILE BY PCR     Status: None   Collection Time    06/24/13 10:09 AM      Result Value Range Status   C difficile by pcr NEGATIVE  NEGATIVE Final    Medical History: Past Medical History  Diagnosis Date  . Hypertension   . Tobacco use disorder   . Seizures     withdraw from xanax  . Cancer     Right colon cancer T3  . Colon cancer   . Neuromuscular disorder   . Colon cancer   . Uterine cancer    Assessment: 89 yoF with hx colon and uterine cancers s/p resections along with superior mesenteric vein thrombosis on lovenox and coumadin.  Pt recently hospitalized with acute colitis and treated with antibiotics which were to continue for 5 more days.  Readmitted for hematuria, BRBPR, hematemesis. Pharmacy consulted to continue ciprofloxacin.  SCr 6.85, estimated CrCl ~7 ml/min.  WBC elevated @ 15.2.  Afebrile.   Noted INR elevated (with anticoagulation on hold) and being reversed.   Ciprofloxacin may cause INR to rise due to DDI until warfarin cleared.    Plan:  1.  Ciprofloxacin 400mg  IV q 24 hours.    Haynes Hoehn E 06/30/2013,5:23 PM

## 2013-06-30 NOTE — Progress Notes (Signed)
Pt states she would like her husband to be able to view My Chart for her. Proxy forms given to patient. Briscoe Burns BSN, RN-BC Admissions RN  06/30/2013 4:19 PM

## 2013-07-01 DIAGNOSIS — R109 Unspecified abdominal pain: Secondary | ICD-10-CM

## 2013-07-01 DIAGNOSIS — F1010 Alcohol abuse, uncomplicated: Secondary | ICD-10-CM

## 2013-07-01 LAB — CBC
HCT: 18.3 % — ABNORMAL LOW (ref 36.0–46.0)
Hemoglobin: 6.3 g/dL — CL (ref 12.0–15.0)
Hemoglobin: 9.4 g/dL — ABNORMAL LOW (ref 12.0–15.0)
MCH: 32.4 pg (ref 26.0–34.0)
MCHC: 34.4 g/dL (ref 30.0–36.0)
MCHC: 34.9 g/dL (ref 30.0–36.0)
MCHC: 35 g/dL (ref 30.0–36.0)
Platelets: 114 10*3/uL — ABNORMAL LOW (ref 150–400)
RBC: 1.89 MIL/uL — ABNORMAL LOW (ref 3.87–5.11)
RDW: 16.2 % — ABNORMAL HIGH (ref 11.5–15.5)
WBC: 8.9 10*3/uL (ref 4.0–10.5)
WBC: 8.6 10*3/uL (ref 4.0–10.5)

## 2013-07-01 LAB — PROTIME-INR
INR: 2.09 — ABNORMAL HIGH (ref 0.00–1.49)
INR: 2.64 — ABNORMAL HIGH (ref 0.00–1.49)
Prothrombin Time: 22.8 seconds — ABNORMAL HIGH (ref 11.6–15.2)
Prothrombin Time: 27.3 seconds — ABNORMAL HIGH (ref 11.6–15.2)

## 2013-07-01 LAB — COMPREHENSIVE METABOLIC PANEL
Albumin: 2.5 g/dL — ABNORMAL LOW (ref 3.5–5.2)
Alkaline Phosphatase: 94 U/L (ref 39–117)
BUN: 66 mg/dL — ABNORMAL HIGH (ref 6–23)
Creatinine, Ser: 6.07 mg/dL — ABNORMAL HIGH (ref 0.50–1.10)
GFR calc Af Amer: 8 mL/min — ABNORMAL LOW (ref >90)
Glucose, Bld: 99 mg/dL (ref 70–99)
Potassium: 3.7 mEq/L (ref 3.5–5.1)
Total Bilirubin: 0.2 mg/dL — ABNORMAL LOW (ref 0.3–1.2)
Total Protein: 4.8 g/dL — ABNORMAL LOW (ref 6.0–8.3)

## 2013-07-01 LAB — PREPARE FRESH FROZEN PLASMA: Unit division: 0

## 2013-07-01 MED ORDER — CHLORHEXIDINE GLUCONATE 0.12 % MT SOLN
15.0000 mL | Freq: Two times a day (BID) | OROMUCOSAL | Status: DC
  Administered 2013-07-01 – 2013-07-04 (×5): 15 mL via OROMUCOSAL
  Filled 2013-07-01 (×3): qty 15
  Filled 2013-07-01: qty 165
  Filled 2013-07-01 (×3): qty 15

## 2013-07-01 MED ORDER — BIOTENE DRY MOUTH MT LIQD: 15.0000 mL | Freq: Two times a day (BID) | OROMUCOSAL | Status: DC

## 2013-07-01 MED ORDER — VITAMIN K1 1 MG/0.5ML IJ SOLN
1.0000 mg | Freq: Once | INTRAMUSCULAR | Status: AC
  Administered 2013-07-01: 1 mg via SUBCUTANEOUS
  Filled 2013-07-01: qty 0.5

## 2013-07-01 NOTE — Progress Notes (Signed)
Patient ID: Laura Farley, female   DOB: 1956/03/22, 57 y.o.   MRN: [id number]    Subjective: Laura Farley continues to have hematuria and mild to moderate right flank pain.   Her Hgb was down to 6.3 from her GI and urinary bleeding and her Cr has increased from normal to >6, but she continues to produce urine.  Her INR is down to 2.09.  ROS: Negative except as above.     Objective: Vital signs in last 24 hours: Temp:  [98.1 F (36.7 C)-99.6 F (37.6 C)] 98.7 F (37.1 C) (07/15 0538) Pulse Rate:  [67-84] 71 (07/15 0600) Resp:  [13-28] 21 (07/15 0600) BP: (74-109)/(29-63) 99/53 mmHg (07/15 0600) SpO2:  [90 %-100 %] 99 % (07/15 0600) Weight:  [54.522 kg (120 lb 3.2 oz)-57.108 kg (125 lb 14.4 oz)] 57.108 kg (125 lb 14.4 oz) (07/14 1717)  Intake/Output from previous day: 07/14 0701 - 07/15 0700 In: 4390.1 [I.V.:2914.6; Blood:1375.5; IV Piggyback:100] Out: 700 [Urine:700] Intake/Output this shift:    General appearance: alert and no distress GI: soft with right upper quadrant tenderness  Lab Results:   Recent Labs  06/30/13 2246 07/01/13 0155  WBC 11.0* 8.9  HGB 7.2* 6.3*  HCT 20.4* 18.3*  PLT 182 151   BMET  Recent Labs  06/30/13 1355 07/01/13 0155  NA 130* 133*  K 3.8 3.7  CL 87* 97  CO2 29 25  GLUCOSE 106* 99  BUN 54* 66*  CREATININE 6.85* 6.07*  CALCIUM 8.7 7.5*   PT/INR  Recent Labs  06/30/13 1355 06/30/13 2345  LABPROT 41.9* 22.8*  INR 4.63* 2.09*   ABG No results found for this basename: PHART, PCO2, PO2, HCO3,  in the last 72 hours  Studies/Results: No results found.  Anti-infectives: Anti-infectives   Start     Dose/Rate Route Frequency Ordered Stop   06/30/13 1800  metroNIDAZOLE (FLAGYL) IVPB 500 mg     500 mg 100 mL/hr over 60 Minutes Intravenous Every 8 hours 06/30/13 1709     06/30/13 1800  ciprofloxacin (CIPRO) IVPB 400 mg     400 mg 200 mL/hr over 60 Minutes Intravenous Every 24 hours 06/30/13 1738        Current  Facility-Administered Medications  Medication Dose Route Frequency Provider Last Rate Last Dose  . 0.9 %  sodium chloride infusion   Intravenous Continuous Leda Gauze, NP 150 mL/hr at 06/30/13 2325    . ciprofloxacin (CIPRO) IVPB 400 mg  400 mg Intravenous Q24H Colleen E Summe, RPH      . metroNIDAZOLE (FLAGYL) IVPB 500 mg  500 mg Intravenous Q8H Penny Pia, MD   500 mg at 07/01/13 0005  . ondansetron (ZOFRAN) tablet 4 mg  4 mg Oral Q6H PRN Penny Pia, MD       Or  . ondansetron [facility]) injection 4 mg  4 mg Intravenous Q6H PRN Penny Pia, MD   4 mg at 06/30/13 1946  . pantoprazole (PROTONIX) injection 40 mg  40 mg Intravenous Q24H Penny Pia, MD      . sodium chloride 0.9 % injection 3 mL  3 mL Intravenous Q12H Penny Pia, MD       Labs and notes from hospital service reviewed.   Assessment: Coagulopathy with acute blood loss anemia from GI and right renal bleeding.   She continues to have hematuria but her INR has declined but still not normal.    She has ARI which is probably secondary to the blood loss and hypovolemia.  Plan: I will continue to follow, but the ARI complicates the issue since contrast studies would not be possible and if the renal bleeding persists, I felt angiography with possible embolization might be considered.   Her bleeding at this time is sufficiently brisk that ureteroscopic management would be very difficult.  Adjust cipro dose for ARI.    LOS: 1 day    Husna Krone J 07/01/2013

## 2013-07-01 NOTE — Progress Notes (Signed)
Patient ID: Laura Farley, female   DOB: [birth year], 57 y.o.   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 She presented to [facility] as a direct admit after she was found to have persistent hematuria and elevated INR of 4.1. Patient reports that the morning of admission she had 2 episodes of bloody vomiting and one episode of BRBPR, hematuria. She was in urologist office, Dr. Wilson Singer and it was reported her SBP was in 80's. TR[facility] called to facilitate admission to SDU. Repeat CBC on admission was significant for Hg 10.3 which is down from last baseline of 13/4 on July 10th, 2014. Pt has denied any symptoms of shortness of breath, no specific abdominal concerns, no other urinary concerns, no specific focal neurological symptoms, no fevers, chills, no other systemic concerns. Pt also denied similar events in the past.   Principal Problem:   Warfarin-induced coagulopathy - continue to hold Coumadin and all heparin products, INR trending down and 2.6 this AM - appreciate pharmacy monitoring PT/INR Active Problems:   Anemia due to blood loss, acute - bleeding from urinary and GI source, still with hematuria - continue to hold Coumadin, INR 2.6 this AM - pt is status post 2 units of PRBC and 2 units of FFP transfusion with appropriate rise in Hg/Hct - monitor closely and repeat CBC in AM   Anxiety - appears to be stable and at her baseline    Mesenteric vein thrombosis - was on Coumadin and Lovenox, now both held per principal problem    Other and unspecified noninfectious gastroenteritis and colitis - recent admission for colitis, pt was treated with Ciprofloxacin and Flagyl and last date of ABX is supposed to be July 15th,so will discontinue after today's dose  Leukocytosis, unspecified - likely secondary to acute demargination and stress  - now stable and within normal limits    Hematemesis - secondary to principal problem, if persists will consider GI consultation    BRBPR (bright red blood per rectum) - also likely secondary to principal problem, Hg and Hct stable over 24 hours    Hematuria - appreciate urology recommendations    Hypotension, unspecified - secondary to acute blood loss, now improved after blood products - monitor in SDU today   Acute kidney failure - secondary to acute blood loss - creatinine trending down with IVF and blood product administration  - repeat BMP in AM  Consultants:  Urologist  Procedures/Studies: CT abdomen and pelvis 06/23/2013 - Improving mesenteric vein thrombosis. However, there is wall thickening in the ileum with interloop fluid collection. This could represent enteritis although given the previous mesenteric thrombus, bowel ischemia is not excluded. Calcification in the pancreatic head suggesting chronic pancreatitis. Changes suggesting the lower esophageal reflux disease with esophageal varices.  Antibiotics:  Ciprofloxacin 7/14 --> 7/15  Flagyl 7/14 --> 7/15  Code Status: Full Family Communication: Pt at bedside Disposition Plan: Home when medically stable  HPI/Subjective: No events overnight.   Objective: Filed Vitals:   07/01/13 0538 07/01/13 0600 07/01/13 0800 07/01/13 1152  BP: 90/47 99/53 90/51  114/57  Pulse: 69 71 68   Temp: 98.7 F (37.1 C)  98.5 F (36.9 C) 98.4 F (36.9 C)  TempSrc: Oral  Oral   Resp: 20 21 21    Height:  Weight:      SpO2: 100% 99% 99%     Intake/Output Summary (Last 24 hours) at 07/01/13 1336 Last data filed at 07/01/13 1245  Gross per 24 hour  Intake 5790.08 ml  Output   1425 ml  Net 4365.08 ml    Exam:   General:  Pt is alert, follows commands appropriately, not in acute distress  Cardiovascular: Regular rate and rhythm, S1/S2, no  murmurs, no rubs, no gallops  Respiratory: Clear to auscultation bilaterally, no wheezing, no crackles, no rhonchi  Abdomen: Soft, non tender, non distended, bowel sounds present, no guarding  Extremities: No edema, pulses DP and PT palpable bilaterally  Neuro: Grossly nonfocal  Data Reviewed: Basic Metabolic Panel:  Recent Labs Lab 06/25/13 0515 06/26/13 0420 06/30/13 1355 07/01/13 0155  NA 138 140 130* 133*  K 4.0 3.8 3.8 3.7  CL 105 104 87* 97  CO2 24 22 29 25   GLUCOSE 89 105* 106* 99  BUN 9 7 54* 66*  CREATININE 1.04 1.11* 6.85* 6.07*  CALCIUM 8.8 9.4 8.7 7.5*   Liver Function Tests:  Recent Labs Lab 06/30/13 1355 07/01/13 0155  AST 26 22  ALT 29 20  ALKPHOS 130* 94  BILITOT 0.3 0.2*  PROT 6.1 4.8*  ALBUMIN 3.1* 2.5*  CBC:  Recent Labs Lab 06/30/13 1355 06/30/13 1716 06/30/13 2246 07/01/13 0155 07/01/13 1040  WBC 15.2* 17.4* 11.0* 8.9 8.6  NEUTROABS 9.4*  --   --   --   --   HGB 10.6* 10.4* 7.2* 6.3* 9.4*  HCT 31.5* 30.9* 20.4* 18.3* 26.9*  MCV 95.7 96.3 95.8 96.8 93.4  PLT 312 314 182 151 118*   Recent Results (from the past 240 hour(s))  CLOSTRIDIUM DIFFICILE BY PCR     Status: None   Collection Time    06/24/13 10:09 AM      Result Value Range Status   C difficile by pcr NEGATIVE  NEGATIVE Final  MRSA PCR SCREENING     Status: None   Collection Time    06/30/13  5:36 PM      Result Value Range Status   MRSA by PCR NEGATIVE  NEGATIVE Final   Comment:            The GeneXpert MRSA Assay (FDA     approved for NASAL specimens     only), is one component of a     comprehensive MRSA colonization     surveillance program. It is not     intended to diagnose MRSA     infection nor to guide or     monitor treatment for     MRSA infections.     Scheduled Meds: . ciprofloxacin  400 mg Intravenous Q24H  . metronidazole  500 mg Intravenous Q8H  . pantoprazole  IV  40 mg Intravenous Q24H   Continuous Infusions: . sodium chloride 150 mL/hr  at 06/30/13 2325   Debbora Presto, MD  [facility] Pager [phone]  If 7PM-7AM, please contact night-coverage [email or web address] Password [email or web address] 07/01/2013, 1:36 PM   LOS: 1 day

## 2013-07-01 NOTE — Progress Notes (Signed)
INITIAL NUTRITION ASSESSMENT  DOCUMENTATION CODES Per approved criteria  -Not Applicable   INTERVENTION: Diet advancement per MD discretion Provide Multivitamin with minerals daily Provide Resource breeze BID when diet advanced  NUTRITION DIAGNOSIS: Increased nutrient needs related to compromised GI function as evidenced by pt with hematuria,hematemesis, BRBPR and hemoglobin of 7.2.   Goal: Pt to meet >/= 90% of their estimated nutrition needs  Monitor:  PO intake Weight Labs  Reason for Assessment: Malnutrition Screening Tool, score of 3  58 y.o. female  Admitting Dx: Warfarin-induced coagulopathy  ASSESSMENT: 57 y.o. female with history of Colon and uterine cancers s/p resections along with Superior Mesenteric Vein Thrombosis on lovenox and coumadin. She presented as a direct admit after she was found to have persistent hematuria and elevated INR of 4.1. Patient reports that this am she has vomited blood x 3 times. While initially on the floor nursing reported that patient had another bout of hematemesis and BRBPR.  Pt states that she has no appetite today but, was eating normally PTA with 2 good meals daily. Pt denies wt loss stating that she usually weighs 120 lbs. Per chart pt follows a bland diet at home but, pt denies this.   Height: Ht Readings from Last 1 Encounters:  06/30/13 5\' 1"  (1.549 m)    Weight: Wt Readings from Last 1 Encounters:  06/30/13 125 lb 14.4 oz (57.108 kg)    Ideal Body Weight: 105 lbs  % Ideal Body Weight: 119%  Wt Readings from Last 10 Encounters:  06/30/13 125 lb 14.4 oz (57.108 kg)  06/23/13 121 lb (54.885 kg)  05/25/13 125 lb 6.4 oz (56.881 kg)  02/09/12 104 lb 9.6 oz (47.446 kg)  02/09/12 104 lb 9.6 oz (47.446 kg)  10/27/11 110 lb 9.6 oz (50.168 kg)    Usual Body Weight: 120 lbs  % Usual Body Weight: 104 %  BMI:  Body mass index is 23.8 kg/(m^2).  Estimated Nutritional Needs: Kcal: 1450-1700 Protein: 80-90 grams Fluid:  2 L  Skin: intact  Diet Order: NPO  EDUCATION NEEDS: -No education needs identified at this time   Intake/Output Summary (Last 24 hours) at 07/01/13 1607 Last data filed at 07/01/13 1516  Gross per 24 hour  Intake 6280.08 ml  Output   1575 ml  Net 4705.08 ml    Last BM: 7/15  Labs:   Recent Labs Lab 06/26/13 0420 06/30/13 1355 07/01/13 0155  NA 140 130* 133*  K 3.8 3.8 3.7  CL 104 87* 97  CO2 22 29 25   BUN 7 54* 66*  CREATININE 1.11* 6.85* 6.07*  CALCIUM 9.4 8.7 7.5*  GLUCOSE 105* 106* 99    CBG (last 3)  No results found for this basename: GLUCAP,  in the last 72 hours  Scheduled Meds: . ciprofloxacin  400 mg Intravenous Q24H  . metronidazole  500 mg Intravenous Q8H  . pantoprazole (PROTONIX) IV  40 mg Intravenous Q24H  . sodium chloride  3 mL Intravenous Q12H    Continuous Infusions: . sodium chloride 1,000 mL (07/01/13 1431)    Past Medical History  Diagnosis Date  . Hypertension   . Tobacco use disorder   . Seizures     withdraw from xanax  . Cancer     Right colon cancer T3  . Colon cancer   . Neuromuscular disorder   . Colon cancer   . Uterine cancer     Past Surgical History  Procedure Laterality Date  . Colon surgery  2006  .  Abdominal hysterectomy  1988    cancer  . Broken arm  2012    left  . Clavicle surgery  2012    left  . Tonsillectomy    . Tunneled venous port placement  2006  . Port-a-cath removal  10/31/2011    Procedure: REMOVAL PORT-A-CATH;  Surgeon: Atilano Ina, MD;  Location: [facility];  Service: General;  Laterality: N/A;  port-a-cath removal  . Esophagogastroduodenoscopy  02/10/2012    Procedure: ESOPHAGOGASTRODUODENOSCOPY (EGD);  Surgeon: Theda Belfast, MD;  Location: [facility] ENDOSCOPY;  Service: Endoscopy;  Laterality: N/A;    Ian Malkin RD, LDN Inpatient Clinical Dietitian Pager: [phone] After Hours Pager: [phone]

## 2013-07-02 ENCOUNTER — Encounter (HOSPITAL_COMMUNITY): Payer: Self-pay

## 2013-07-02 ENCOUNTER — Encounter (HOSPITAL_COMMUNITY)
Admission: AD | Disposition: A | Discharge status: Home / Self Care | Payer: Self-pay | Source: Ambulatory Visit | Attending: Internal Medicine

## 2013-07-02 HISTORY — PX: ESOPHAGOGASTRODUODENOSCOPY: SHX5428

## 2013-07-02 LAB — BASIC METABOLIC PANEL
Calcium: 8.7 mg/dL (ref 8.4–10.5)
GFR calc Af Amer: 19 mL/min — ABNORMAL LOW (ref >90)
GFR calc non Af Amer: 17 mL/min — ABNORMAL LOW (ref >90)
Sodium: 138 mEq/L (ref 135–145)

## 2013-07-02 LAB — TYPE AND SCREEN: Unit division: 0

## 2013-07-02 LAB — CBC
MCH: 32.6 pg (ref 26.0–34.0)
MCHC: 34.9 g/dL (ref 30.0–36.0)
Platelets: 126 10*3/uL — ABNORMAL LOW (ref 150–400)
RDW: 16.9 % — ABNORMAL HIGH (ref 11.5–15.5)

## 2013-07-02 LAB — PROTIME-INR
INR: 2.26 — ABNORMAL HIGH (ref 0.00–1.49)
Prothrombin Time: 24.2 seconds — ABNORMAL HIGH (ref 11.6–15.2)

## 2013-07-02 SURGERY — EGD (ESOPHAGOGASTRODUODENOSCOPY)
Anesthesia: Moderate Sedation

## 2013-07-02 MED ORDER — FENTANYL CITRATE 0.05 MG/ML IJ SOLN
INTRAMUSCULAR | Status: DC | PRN
  Administered 2013-07-02 (×3): 25 ug via INTRAVENOUS

## 2013-07-02 MED ORDER — MIDAZOLAM HCL 10 MG/2ML IJ SOLN
INTRAMUSCULAR | Status: AC
  Filled 2013-07-02: qty 4

## 2013-07-02 MED ORDER — PANTOPRAZOLE SODIUM 40 MG IV SOLR
40.0000 mg | Freq: Two times a day (BID) | INTRAVENOUS | Status: DC
  Administered 2013-07-02 – 2013-07-04 (×5): 40 mg via INTRAVENOUS
  Filled 2013-07-02 (×6): qty 40

## 2013-07-02 MED ORDER — DIPHENHYDRAMINE HCL 50 MG/ML IJ SOLN
INTRAMUSCULAR | Status: AC
  Filled 2013-07-02: qty 1

## 2013-07-02 MED ORDER — SODIUM CHLORIDE 0.9 % IV SOLN: INTRAVENOUS | Status: DC

## 2013-07-02 MED ORDER — BUTAMBEN-TETRACAINE-BENZOCAINE 2-2-14 % EX AERO
INHALATION_SPRAY | CUTANEOUS | Status: DC | PRN
  Administered 2013-07-02: 2 via TOPICAL

## 2013-07-02 MED ORDER — POLYETHYLENE GLYCOL 3350 17 G PO PACK
17.0000 g | PACK | Freq: Four times a day (QID) | ORAL | Status: AC
  Administered 2013-07-03: 17 g via ORAL
  Filled 2013-07-02 (×4): qty 1

## 2013-07-02 MED ORDER — ALPRAZOLAM 0.5 MG PO TABS
0.5000 mg | ORAL_TABLET | Freq: Once | ORAL | Status: AC
  Administered 2013-07-02: 0.5 mg via ORAL
  Filled 2013-07-02: qty 1

## 2013-07-02 MED ORDER — CLONAZEPAM 1 MG PO TABS
2.0000 mg | ORAL_TABLET | Freq: Every evening | ORAL | Status: DC | PRN
  Administered 2013-07-02 – 2013-07-03 (×2): 2 mg via ORAL
  Filled 2013-07-02 (×3): qty 2

## 2013-07-02 MED ORDER — FENTANYL CITRATE 0.05 MG/ML IJ SOLN
INTRAMUSCULAR | Status: AC
  Filled 2013-07-02: qty 4

## 2013-07-02 MED ORDER — MIDAZOLAM HCL 10 MG/2ML IJ SOLN
INTRAMUSCULAR | Status: DC | PRN
  Administered 2013-07-02 (×4): 2 mg via INTRAVENOUS

## 2013-07-02 NOTE — Progress Notes (Signed)
Patient returned from endoscopy via bed. Nurse transported patient. Patient in no sign of distress. Patient Vitals WNL. Patient placed on monitor. Will continue to monitor

## 2013-07-02 NOTE — Progress Notes (Signed)
Patient ID: Laura Farley, female   DOB: 1956/10/13, 57 y.o.   MRN: [id number]    Subjective: Laura Farley is tearful today but reports no flank pain and her urine is clear.   Her Cr has declined to 2.94 and her Hgb is stable post transfusion.   ROS: Negative except for depression and continued melanotic stools.  She denies nausea or fever.    Objective: Vital signs in last 24 hours: Temp:  [98.1 F (36.7 C)-98.5 F (36.9 C)] 98.1 F (36.7 C) (07/16 0000) Pulse Rate:  [68-77] 74 (07/16 0600) Resp:  [16-21] 16 (07/16 0600) BP: (99-126)/(42-66) 118/63 mmHg (07/16 0600) SpO2:  [100 %] 100 % (07/16 0600)  Intake/Output from previous day: 07/15 0701 - 07/16 0700 In: 3461.3 [I.V.:2711.3; Blood:350; IV Piggyback:400] Out: 2400 [Urine:2400] Intake/Output this shift:    General appearance: alert, no distress and tearful  Lab Results:   Recent Labs  07/01/13 1629 07/02/13 0404  WBC 7.7 6.7  HGB 9.0* 9.4*  HCT 25.7* 26.9*  PLT 114* 126*   BMET  Recent Labs  07/01/13 0155 07/02/13 0404  NA 133* 138  K 3.7 4.4  CL 97 110  CO2 25 18*  GLUCOSE 99 96  BUN 66* 52*  CREATININE 6.07* 2.94*  CALCIUM 7.5* 8.7   PT/INR  Recent Labs  06/30/13 2345 07/01/13 1040  LABPROT 22.8* 27.3*  INR 2.09* 2.64*   ABG No results found for this basename: PHART, PCO2, PO2, HCO3,  in the last 72 hours  Studies/Results: No results found.  Anti-infectives: Anti-infectives   Start     Dose/Rate Route Frequency Ordered Stop   06/30/13 1800  metroNIDAZOLE (FLAGYL) IVPB 500 mg  Status:  Discontinued     500 mg 100 mL/hr over 60 Minutes Intravenous Every 8 hours 06/30/13 1709 07/01/13 2019   06/30/13 1800  ciprofloxacin (CIPRO) IVPB 400 mg  Status:  Discontinued     400 mg 200 mL/hr over 60 Minutes Intravenous Every 24 hours 06/30/13 1738 07/01/13 2019      Current Facility-Administered Medications  Medication Dose Route Frequency Provider Last Rate Last Dose  . 0.9 %  sodium chloride  infusion   Intravenous Continuous Dorothea Ogle, MD 75 mL/hr at 07/01/13 2209 1,000 mL at 07/01/13 2209  . antiseptic oral rinse (BIOTENE) solution 15 mL  15 mL Mouth Rinse q12n4p Dorothea Ogle, MD      . chlorhexidine (PERIDEX) 0.12 % solution 15 mL  15 mL Mouth Rinse BID Dorothea Ogle, MD   15 mL at 07/01/13 2212  . ondansetron (ZOFRAN) tablet 4 mg  4 mg Oral Q6H PRN Penny Pia, MD       Or  . ondansetron [facility]) injection 4 mg  4 mg Intravenous Q6H PRN Penny Pia, MD   4 mg at 07/01/13 0954  . pantoprazole (PROTONIX) injection 40 mg  40 mg Intravenous Q12H Laveda Norman, MD      . sodium chloride 0.9 % injection 3 mL  3 mL Intravenous Q12H Penny Pia, MD   3 mL at 07/01/13 1151   I have reviewed her recent labs and notes and discussed her case with Dr. Brien Few.   He is going to consult GI for endoscopy which she hasn't had in 2 years.  Assessment: Her hematuria and right flank pain have resolved with reversal of anticoagulation and her Cr is improving post resusitation.   Plan: I will get her set up for cystoscopy with right retrograde and ureteroscopy  for further evaluation of the bleeding from the right ureter.  Urine cytology today.      LOS: 2 days    Anner Crete 07/02/2013

## 2013-07-02 NOTE — Interval H&P Note (Signed)
History and Physical Interval Note:  07/02/2013 4:00 PM  Laura Farley  has presented today for surgery, with the diagnosis of melena  The various methods of treatment have been discussed with the patient and family. After consideration of risks, benefits and other options for treatment, the patient has consented to  Procedure(s): ESOPHAGOGASTRODUODENOSCOPY (EGD) (N/A) as a surgical intervention .  The patient's history has been reviewed, patient examined, no change in status, stable for surgery.  I have reviewed the patient's chart and labs.  Questions were answered to the patient's satisfaction.     Florencia Reasons

## 2013-07-02 NOTE — Progress Notes (Signed)
Patient ID: Laura Farley, female   DOB: 1956/12/09, 57 y.o.   MRN: [id number]   Urine culture from her Monday office visit is negative.

## 2013-07-02 NOTE — Progress Notes (Signed)
[facility] PROGRESS NOTE  Laura Farley YQM:[id number] DOB: 06-Jul-1956 DOA: 06/30/2013 PCP: Aura Dials, MD  Assessment/Plan: Principal Problem:   Warfarin-induced coagulopathy Active Problems:   Anemia due to blood loss, acute   Anxiety   Mesenteric vein thrombosis   Other and unspecified noninfectious gastroenteritis and colitis(558.9)   Leukocytosis, unspecified   Hematemesis   BRBPR (bright red blood per rectum)   Hematuria   Hypotension, unspecified    1. GI Bleed: Patient presented with hematemesis and hematochezia, which started of Lovenox/Coumadin-induced coagulopathy.  day of admission, in the setting of Warfarin-induced coagulopathy. Hematemesis has since resolved but patient continues to have persistent melenic stools. Transfusing prn, following CBC. Will add PPI to treatment today, and consult GI.  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 Per EMR, reportedly, EGD at least 2 years ago, revealed esophageal varices.  2. Hematuria: Gross hematuria was noted originally in urologists office on day of admission. This was associated with right flank pain. Dr Bjorn Pippin provided urology consultation and has opined that patient probably has right renal hemorrhage. He feels that the options are ureteroscopic management vs angiography with embolization, with caveats imposed by technical difficulties and ARF. Urine appears clear today.  3. Anemia due to blood loss, acute/Hemorrhagic shock: At presentation, HB was 10.2, SBP in the 80s (known baseline HB 1.4 on 06/26/13). This was due to concurrent GI/urinary tract hemorrhage, in setting of coagulopathy, with INR 4.63. Anticoagulants were placed on hold, and patient transfused  2 units of PRBC, as well as 2 units of FFP na d aggressive ivi fluid administration. Patient has attained hemodynamic stability, and INR was 2.64 on 07/01/13. Following PT/INR. HB is 9.4 today.  4. Mesenteric vein thrombosis:  Patient has a known history of superior mesenteric vein thrombosis diagnosed in 05/2012, and following her last hospitalization from 06/22/13-06/26/13, she was discharged on Coumadin/Lovenox, now both held per hemorrhagic complications, described above. 5. Acute kidney failure:  On admission, creatinine was 6.85 (baseline creatinine 1.11 on 06/26/13), secondary to acute blood loss, hypovolemia and hypotension, as well as right renal hemorrhage. Managing with iv fluids as well as specific management of hemorrhagic diathesis and complications. Following renal indices, and creatinine is trending down steadily.   6. Other and unspecified noninfectious gastroenteritis and colitis: Hospitalized 06/22/13-06/26/13, for colitis, treated with Ciprofloxacin/Flagyl. Antibiotics concluded on 07/01/13.    Code Status: Full Code.  Family Communication:  Disposition Plan: To be determined.    Brief narrative: 57 yo female with colon and uterine cancer, s/p TAH/Colon resection, HTN, superior mesenteric vein thrombosis on Lovenox and Coumadin. She presented to [facility] as a direct admit after she was found to have persistent hematuria and elevated INR of 4.1. Patient reports that the morning of admission she had 2 episodes of bloody vomiting and one episode of BRBPR, hematuria. She was in urologist office, Dr. Wilson Singer and it was reported her SBP was in 80's. TR[facility] called to facilitate admission to SDU. Repeat CBC on admission was significant for Hg 10.3 which is down from last baseline of 13.4 on July 10th, 2014. Admitted for further management.    Consultants:  Dr Bjorn Pippin, urologist.   Procedures:  N/A.   Antibiotics:  Flagyl 06/30/13-07/01/13.  Ciprofloxacin 07/01/13-07/01/13.   HPI/Subjective: Feels much better. Urine is clear, but has had a melenic stool this AM. No abdominal pain or vomiting.   Objective: Vital signs in last 24 hours: Temp:  [98.1 F (36.7 C)-98.5 F (36.9  C)] 98.1 F (36.7 C) (07/16 0000) Pulse  Rate:  [68-77] 74 (07/16 0600) Resp:  [16-21] 16 (07/16 0600) BP: (90-126)/(42-66) 118/63 mmHg (07/16 0600) SpO2:  [99 %-100 %] 100 % (07/16 0600) Weight change:  Last BM Date: 07/01/13  Intake/Output from previous day: 07/15 0701 - 07/16 0700 In: 3461.3 [I.V.:2711.3; Blood:350; IV Piggyback:400] Out: 2400 [Urine:2400]     Physical Exam: General: Comfortable, alert, communicative, fully oriented, not short of breath at rest.  HEENT:  Mild clinical pallor, no jaundice, no conjunctival injection or discharge. NECK:  Supple, JVP not seen, no carotid bruits, no palpable lymphadenopathy, no palpable goiter. CHEST:  Clinically clear to auscultation, no wheezes, no crackles. HEART:  Sounds 1 and 2 heard, normal, regular, no murmurs. ABDOMEN:  Full, soft, non-tender, no palpable organomegaly, no palpable masses, normal bowel sounds. GENITALIA:  Not examined. LOWER EXTREMITIES:  No pitting edema, palpable peripheral pulses. MUSCULOSKELETAL SYSTEM:  Unremarkable. CENTRAL NERVOUS SYSTEM:  No focal neurologic deficit on gross examination.  Lab Results:  Recent Labs  07/01/13 1629 07/02/13 0404  WBC 7.7 6.7  HGB 9.0* 9.4*  HCT 25.7* 26.9*  PLT 114* 126*    Recent Labs  07/01/13 0155 07/02/13 0404  NA 133* 138  K 3.7 4.4  CL 97 110  CO2 25 18*  GLUCOSE 99 96  BUN 66* 52*  CREATININE 6.07* 2.94*  CALCIUM 7.5* 8.7   Recent Results (from the past 240 hour(s))  CLOSTRIDIUM DIFFICILE BY PCR     Status: None   Collection Time    06/24/13 10:09 AM      Result Value Range Status   C difficile by pcr NEGATIVE  NEGATIVE Final  MRSA PCR SCREENING     Status: None   Collection Time    06/30/13  5:36 PM      Result Value Range Status   MRSA by PCR NEGATIVE  NEGATIVE Final   Comment:            The GeneXpert MRSA Assay (FDA     approved for NASAL specimens     only), is one component of a     comprehensive MRSA colonization     surveillance program. It is not     intended to  diagnose MRSA     infection nor to guide or     monitor treatment for     MRSA infections.     Studies/Results: No results found.  Medications: Scheduled Meds: . antiseptic oral rinse  15 mL Mouth Rinse q12n4p  . chlorhexidine  15 mL Mouth Rinse BID  . pantoprazole (PROTONIX) IV  40 mg Intravenous Q24H  . sodium chloride  3 mL Intravenous Q12H   Continuous Infusions: . sodium chloride 1,000 mL (07/01/13 2209)   PRN Meds:.ondansetron (ZOFRAN) IV, ondansetron    LOS: 2 days   Mariabelen Pressly,CHRISTOPHER  [facility] Pager [phone]. If 8PM-8AM, please contact night-coverage at [email or web address], password [email or web address] 07/02/2013, 7:11 AM  LOS: 2 days

## 2013-07-02 NOTE — Consult Note (Signed)
Referring Provider: Dr. Ricke Hey Primary Care Physician:  Aura Dials, MD Primary Gastroenterologist:  Gentry Fitz (seen previously by Dr. Elnoria Howard, Dr. Evette Cristal, Dr. Randa Evens, Dr. Dulce Sellar)  Reason for Consultation:  GI bleeding  HPI: Laura Farley is a 57 y.o. female admitted 2 days ago with hematuria and history of hematemesis, with a dramatic drop in hemoglobin. She has continued to have melenic stool although her hemoglobin has been stable over the past 24 hours.  Her history is complicated and is probably best addressed by her individual GI problems:  #1 History of colon cancer. Around 2006, the patient had a right hemicolectomy, and chemotherapy for T3 colon cancer. Her most recent colonoscopy was in February of 2012 by Dr. Jeani Hawking, and was negative except for a small .rectal hyperplastic polyp.  #2 Portal hypertension, history of alcohol abuse. The patient indicates she drank heavily in past years, but has had no alcohol for the past 6 months or so. Last year, in February of 23, she presented with lower, pain, nausea vomiting, melena, and a drop in hemoglobin. She underwent endoscopy by Dr. 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 Her current CT scan shows pancreatic calcification suggestive of chronic pancreatitis.  #3 Mesenteric venous thrombosis, treated with anticoagulation. This occurred a month ago when the patient presented to the hospital with low abdominal pain and was found on CT scanning to have the superior mesenteric venous thrombosis, not totally occluding, and without associated small bowel ischemic changes. Nonetheless, the patient was treated with anticoagulation and her symptoms improved. She has thus been maintained on warfarin.  #4 Possible gastroenteritis. A week ago, the patient showed up after being at a picnic, with nausea, vomiting, abdominal pain, and questionable bleeding. It is not clear if this was food poisoning. A repeat  CT scan showed improvement in the mesenteric venous thrombosis while on anticoagulation, although there was a loop of ileum that was apparently thickened, raising the question of possible ischemia versus enteritis.  #5 Acute GI bleed in association with over-anticoagulation. The patient presented to the hospital 2 days ago with hematuria and hematemesis. Her INR had gone out from her previous value of 1.8 to 4.6. Her hemoglobin had dropped from 13.4 less than a week earlier, to a nadir of 6.3. She has been transfused and her hemoglobin has been stable over the past 24 hours. She also presented with acute renal failure. Her creatinine had been 1.1 a week earlier and on this admission it was 6.8. She has continued to have melenic stools. Accordingly, we have been asked to see the patient for consideration of possible endoscopy.    Past Medical History  Diagnosis Date  . Hypertension   . Tobacco use disorder   . Seizures     withdraw from xanax  . Cancer     Right colon cancer T3  . Colon cancer   . Neuromuscular disorder   . Colon cancer   . Uterine cancer     Past Surgical History  Procedure Laterality Date  . Colon surgery  2006  . Abdominal hysterectomy  1988    cancer  . Broken arm  2012    left  . Clavicle surgery  2012    left  . Tonsillectomy    . Tunneled venous port placement  2006  . Port-a-cath removal  10/31/2011    Procedure: REMOVAL PORT-A-CATH;  Surgeon: Atilano Ina, MD;  Location: [facility];  Service: General;  Laterality:  N/A;  port-a-cath removal  . Esophagogastroduodenoscopy  02/10/2012    Procedure: ESOPHAGOGASTRODUODENOSCOPY (EGD);  Surgeon: Theda Belfast, MD;  Location: [facility];  Service: Endoscopy;  Laterality: N/A;    Prior to Admission medications   Medication Sig Start Date End Date Taking? Authorizing Provider  amoxicillin (AMOXIL) 500 MG capsule Take 500 mg by mouth 3 (three) times daily.   Yes Historical Provider, MD  Calcium  Carbonate-Vitamin D (CALCIUM + D PO) Take 1 tablet by mouth every evening.   Yes Historical Provider, MD  ciprofloxacin (CIPRO) 500 MG tablet Take 1 tablet (500 mg total) by mouth 2 (two) times daily. 06/26/13  Yes Joseph Art, DO  clonazePAM (KLONOPIN) 2 MG tablet Take 2 mg by mouth 2 (two) times daily as needed for anxiety.    Yes Historical Provider, MD  enoxaparin (LOVENOX) 60 MG/0.6ML injection Inject 0.55 mLs (55 mg total) into the skin every 12 (twelve) hours. 06/26/13  Yes Joseph Art, DO  metroNIDAZOLE (FLAGYL) 500 MG tablet Take 1 tablet (500 mg total) by mouth 3 (three) times daily. 06/26/13  Yes Joseph Art, DO  oxyCODONE (OXY IR/ROXICODONE) 5 MG immediate release tablet Take 1 tablet (5 mg total) by mouth every 6 (six) hours as needed for pain. 05/25/13  Yes Shanker Levora Dredge, MD  pantoprazole (PROTONIX) 40 MG tablet Take 40 mg by mouth daily.   Yes Historical Provider, MD  PARoxetine (PAXIL) 40 MG tablet Take 40 mg by mouth every morning.   Yes Historical Provider, MD  propranolol (INDERAL) 20 MG tablet Take 1 tablet (20 mg total) by mouth 2 (two) times daily. 05/25/13  Yes Shanker Levora Dredge, MD  spironolactone (ALDACTONE) 50 MG tablet Take 1 tablet (50 mg total) by mouth daily. 05/25/13  Yes Shanker Levora Dredge, MD  warfarin (COUMADIN) 5 MG tablet Take 1 tablet (5 mg total) by mouth daily. 06/26/13  Yes Joseph Art, DO    Current Facility-Administered Medications  Medication Dose Route Frequency Provider Last Rate Last Dose  . 0.9 %  sodium chloride infusion   Intravenous Continuous Dorothea Ogle, MD 75 mL/hr at 07/01/13 2209 1,000 mL at 07/01/13 2209  . antiseptic oral rinse (BIOTENE) solution 15 mL  15 mL Mouth Rinse q12n4p Dorothea Ogle, MD      . chlorhexidine (PERIDEX) 0.12 % solution 15 mL  15 mL Mouth Rinse BID Dorothea Ogle, MD   15 mL at 07/02/13 4098  . ondansetron (ZOFRAN) tablet 4 mg  4 mg Oral Q6H PRN Penny Pia, MD       Or  . ondansetron [facility]) injection 4 mg  4  mg Intravenous Q6H PRN Penny Pia, MD   4 mg at 07/01/13 0954  . pantoprazole (PROTONIX) injection 40 mg  40 mg Intravenous Q12H Laveda Norman, MD   40 mg at 07/02/13 0900  . polyethylene glycol (MIRALAX / GLYCOLAX) packet 17 g  17 g Oral QID Katy Fitch Ammiel Guiney, MD      . sodium chloride 0.9 % injection 3 mL  3 mL Intravenous Q12H Penny Pia, MD   3 mL at 07/01/13 1151    Allergies as of 06/30/2013 - Review Complete 06/30/2013  Allergen Reaction Noted  . Venlafaxine hcl Other (See Comments) 05/20/2013    Family History  Problem Relation Age of Onset  . Cancer Father     prostate  . Hypertension Sister      X  2  . Hypertension Brother  History   Social History  . Marital Status: Married    Spouse Name: N/A    Number of Children: N/A  . Years of Education: N/A   Occupational History  . 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 Smoking status: Current Every Day Smoker -- 0.50 packs/day  . Smokeless tobacco: Never Used  . Alcohol Use: No     Comment: stated she quit  . Drug Use: No  . Sexually Active: Not on file   Other Topics Concern  . Not on file   Social History Narrative   Lives with husband and son           Review of Systems: Please see history of present illness   Physical Exam: Vital signs in last 24 hours: Temp:  [97.9 F (36.6 C)-98.2 F (36.8 C)] 98.2 F (36.8 C) (07/16 1457) Pulse Rate:  [57-84] 70 (07/16 1703) Resp:  [13-28] 22 (07/16 1703) BP: (99-148)/(42-84) 108/67 mmHg (07/16 1703) SpO2:  [96 %-100 %] 99 % (07/16 1703) Last BM Date: 07/02/13 General:   Alert,  Well-developed, well-nourished, pleasant and cooperative in NAD Head:  Normocephalic and atraumatic. Eyes:  Sclera clear, no icterus.   Mouth:   No ulcerations or lesions.  Oropharynx pink & moist. Lungs:  Clear throughout to auscultation.   No wheezes, crackles, or rhonchi. No evident respiratory distress. Heart:   Regular rate  and rhythm; no murmurs, clicks, rubs,  or gallops. Abdomen:  Soft, nontender, nontympanitic, and nondistended. No masses, hepatosplenomegaly or ventral hernias noted. Normal bowel sounds, without bruits, guarding, or rebound.   Msk:   Symmetrical without gross deformities. Neurologic:  Alert and coherent;  grossly normal neurologically. Skin:  Intact without significant lesions or rashes. Psych:   Alert and cooperative. Normal mood and affect.  Intake/Output from previous day: 07/15 0701 - 07/16 0700 In: 3536.3 [I.V.:2786.3; Blood:350; IV Piggyback:400] Out: 2400 [Urine:2400] Intake/Output this shift: Total I/O In: 675 [I.V.:675] Out: 1050 [Urine:1050]  Lab Results:  Recent Labs  07/01/13 1040 07/01/13 1629 07/02/13 0404  WBC 8.6 7.7 6.7  HGB 9.4* 9.0* 9.4*  HCT 26.9* 25.7* 26.9*  PLT 118* 114* 126*   BMET  Recent Labs  06/30/13 1355 07/01/13 0155 07/02/13 0404  NA 130* 133* 138  K 3.8 3.7 4.4  CL 87* 97 110  CO2 29 25 18*  GLUCOSE 106* 99 96  BUN 54* 66* 52*  CREATININE 6.85* 6.07* 2.94*  CALCIUM 8.7 7.5* 8.7   LFT  Recent Labs  07/01/13 0155  PROT 4.8*  ALBUMIN 2.5*  AST 22  ALT 20  ALKPHOS 94  BILITOT 0.2*   PT/INR  Recent Labs  07/01/13 1040 07/02/13 0830  LABPROT 27.3* 24.2*  INR 2.64* 2.26*     Studies/Results: No results found.  Impression:  The patient does not show signs of chronic liver disease on physical exam, nor did she have thrombocytopenia on her previous hospitalization prior to her acute hemorrhage. Thus, I think that there is a good chance that her bleeding is due to over anticoagulation rather than been a primary portal hypertensive bleed.   Plan: Endoscopic evaluation this afternoon. Purpose and risks reviewed, patient agreeable; further management to depend on those findings.    LOS: 2 days   Terryann Verbeek V  07/02/2013, 5:33 PM

## 2013-07-02 NOTE — Progress Notes (Signed)
Endoscopic findings reviewed with Dr. Brien Few.  She shows no current signs of bleeding from the upper tract. Based on her presenting history of hematemesis, I suppose that her varices and/or portal gastropathy played a role in the bleeding, even though they do not look very impressive at the present time.  We will attempt to purge the blood out of her GI tract, with further management to deep depend on her clinical evolution. Her INR is currently therapeutic and I do not see an indication for further reversal of it at this time.  Florencia Reasons, M.D. [phone]

## 2013-07-02 NOTE — Progress Notes (Signed)
Patient transported to ENDO per wheelchair. Patient stable.VS WNL. Elink notified.CMT notified

## 2013-07-02 NOTE — Op Note (Signed)
[facility] [address] [place], [address]   ENDOSCOPY PROCEDURE REPORT  PATIENT: [name], [name]  MR#: [id number] BIRTHDATE: 05/16/1956 , 56  yrs. old GENDER: Female ENDOSCOPIST:Saida Lonon, MD REFERRED BY:  Dr. Everitt Amber PROCEDURE DATE:  07/02/2013 PROCEDURE:      upper endoscopy ASA CLASS: INDICATIONS:   melenic stools, drop in hemoglobin while on warfarin, evidence for portal hypertension MEDICATION:    fentanyl 75 mcg Versed 8 mg IV TOPICAL ANESTHETIC:    Cetacaine spray  DESCRIPTION OF PROCEDURE:   the patient was brought from her hospital room to the [facility] long endoscopy unit and provided written consent. Time out was performed. She received the above sedation and remained stable throughout the procedure, which she tolerated well.  The Pentax adult video endoscope was passed under direct vision. The vocal cords looked normal. The esophagus was readily entered.  The distal esophagus did have mild varices, but these would flatten out with insufflation and although some of them had a slight red color sign, none of them had a nipple or stigma of recent hemorrhage. They appeared to be too small to band. There was no reflux esophagitis, Barrett's esophagus, infection, or neoplasia in the esophagus, nor any evidence of a ring, stricture, or Mallory-Weiss tear. A small, 1-2 cm hiatal hernia was present.  The stomach was entered. It contained no blood or coffee-ground material. There was no significant gastric erythema but on close inspection of the mucosa, there did appear to be some mild "snake skinning" of the mucosa consistent with mild portal gastropathy. No significant erythema was noted but there was some minimal contact hemorrhage or friability. No erosions, ulcers, polyps, or masses were seen.  Retroflexion was performed and revealed a normal-appearing cardia, without evidence of gastric varices.  The pylorus, duodenal bulb, and second  duodenum were well seen and appeared normal.  No biopsies were obtained.     COMPLICATIONS: None  ENDOSCOPIC IMPRESSION: 1. No active bleeding or blood in the stomach at the time this exam.  2. No definite source of recent bleeding from the upper tract identified.  3. Small esophageal varices and possible mild portal gastropathy. It is conceivable that either of these could have been the source of the patient's recent bleeding, especially with over-anticoagulation.    RECOMMENDATIONS:  1. The patient is somewhat overdue for surveillance colonoscopy, given a prior history of colon cancer. If she shows evidence of continued bleeding while in the hospital with a therapeutic INR, I would favor colonoscopy as an inpatient. Otherwise, it might be deferred to outpatient status.  2. I will start the patient on a clear liquid diet and give her laxatives to help purge old blood from the colon. This should help [place] confirm whether ongoing blood loss is occurring (her BUN still elevated but she is significant renal dysfunction; her hemoglobin has been stable for 24 hours).   _______________________________ eSignedBernette Redbird, MD 07/02/2013 4:35 PM    PATIENT NAME:  [name], [name] MR#: [id number]

## 2013-07-03 ENCOUNTER — Encounter (HOSPITAL_COMMUNITY): Payer: Self-pay | Admitting: Gastroenterology

## 2013-07-03 DIAGNOSIS — K55059 Acute (reversible) ischemia of intestine, part and extent unspecified: Secondary | ICD-10-CM

## 2013-07-03 DIAGNOSIS — K319 Disease of stomach and duodenum, unspecified: Secondary | ICD-10-CM

## 2013-07-03 LAB — BASIC METABOLIC PANEL
BUN: 25 mg/dL — ABNORMAL HIGH (ref 6–23)
Chloride: 111 mEq/L (ref 96–112)
Creatinine, Ser: 1.3 mg/dL — ABNORMAL HIGH (ref 0.50–1.10)
GFR calc Af Amer: 51 mL/min — ABNORMAL LOW (ref >90)
GFR calc non Af Amer: 44 mL/min — ABNORMAL LOW (ref >90)
Potassium: 3.5 mEq/L (ref 3.5–5.1)

## 2013-07-03 LAB — PROTIME-INR: INR: 2.55 — ABNORMAL HIGH (ref 0.00–1.49)

## 2013-07-03 LAB — CBC
MCHC: 34.4 g/dL (ref 30.0–36.0)
MCV: 93.4 fL (ref 78.0–100.0)
Platelets: 145 10*3/uL — ABNORMAL LOW (ref 150–400)
RDW: 16.7 % — ABNORMAL HIGH (ref 11.5–15.5)
WBC: 5.2 10*3/uL (ref 4.0–10.5)

## 2013-07-03 MED ORDER — ADULT MULTIVITAMIN W/MINERALS CH
1.0000 | ORAL_TABLET | Freq: Every day | ORAL | Status: DC
  Administered 2013-07-03 – 2013-07-04 (×2): 1 via ORAL
  Filled 2013-07-03 (×2): qty 1

## 2013-07-03 MED ORDER — ENSURE COMPLETE PO LIQD
237.0000 mL | Freq: Two times a day (BID) | ORAL | Status: DC
  Administered 2013-07-04: 237 mL via ORAL

## 2013-07-03 NOTE — Progress Notes (Signed)
[facility] PROGRESS NOTE  Laura Farley:[id number] DOB: [birth year] DOA: 06/30/2013 PCP: Aura Dials, MD  Assessment/Plan: Principal Problem:   Warfarin-induced coagulopathy Active Problems:   Anemia due to blood loss, acute   Anxiety   Mesenteric vein thrombosis   Other and unspecified noninfectious gastroenteritis and colitis(558.9)   Leukocytosis, unspecified   Hematemesis   BRBPR (bright red blood per rectum)   Hematuria   Hypotension, unspecified    1. GI Bleed: Patient presented with hematemesis and hematochezia on day of admission, in the setting of Warfarin-induced coagulopathy. Hematemesis has since resolved, but as of 07/02/13, patient continued to have persistent melenic stools. Transfusing prn, following CBC and PPI to treatment on 07/02/13. 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 Per EMR, reportedly, EGD at least 2 years ago, revealed esophageal varices. Dr Molly Maduro Buccini provided GI consultation, and performed an EGD on 07/02/13, which revealed no current signs of bleeding from the upper tract, although he opined that based on her presenting history of hematemesis, her varices and/or portal gastropathy may have played a role in the bleeding, even though they do not look very impressive at the present time. No further melena noted since.  2. Hematuria: Gross hematuria was noted originally in urologists office on day of admission. This was associated with right flank pain. Dr Bjorn Pippin provided urology consultation and has opined that patient probably has right renal hemorrhage. He feels that the options are ureteroscopic management vs angiography with embolization, with caveats imposed by technical difficulties and ARF. Had cleared up by 07/02/13. Dr Annabell Howells is planning a cystoscopy/ureteroscopy, although precise timing is still unclear.  3. Anemia due to blood loss, acute/Hemorrhagic shock: At presentation, HB was 10.2, SBP in the  80s (known baseline HB 1.4 on 06/26/13). This was due to concurrent GI/urinary tract hemorrhage, in setting of coagulopathy, with INR 4.63. Anticoagulants were placed on hold, and patient transfused 59 units of PRBC, as well as 2 units of FFP na d aggressive ivi fluid administration. Patient has attained hemodynamic stability, and INR has improved, although still therapeutic. Following PT/INR. HB is 8.7 today.  4. Mesenteric vein thrombosis: Patient has a known history of superior mesenteric vein thrombosis diagnosed in 05/2012, and following her last hospitalization from 06/22/13-06/26/13, she was discharged on Coumadin/Lovenox, now both held per hemorrhagic complications, described above. 5. Acute kidney failure:  On admission, creatinine was 59.85 (baseline creatinine 1.11 on 06/26/13), secondary to acute blood loss, hypovolemia and hypotension, as well as right renal hemorrhage. Managing with iv fluids as well as specific management of hemorrhagic diathesis and complications. Following renal indices, creatinine is trending down steadily and has practically normalized at 1.30 today. ARF has resolved.   6. Other and unspecified noninfectious gastroenteritis and colitis: Hospitalized 06/22/13-06/26/13, for colitis, treated with Ciprofloxacin/Flagyl. Antibiotics concluded on 07/01/13.    Code Status: Full Code.  Family Communication:  Disposition Plan: To be determined. Stable for transfer to telemetry floor today.    Brief narrative: 57 yo female with colon and uterine cancer, s/p TAH/Colon resection, HTN, superior mesenteric vein thrombosis on Lovenox and Coumadin. She presented to [facility] as a direct admit after she was found to have persistent hematuria and elevated INR of 4.1. Patient reports that the morning of admission she had 2 episodes of bloody vomiting and one episode of BRBPR, hematuria. She was in urologist office, Dr. Wilson Singer and it was reported her SBP was in 80's. TRH called to facilitate admission to  SDU.  Repeat CBC on admission was significant for Hg 10.3 which is down from last baseline of 13.4 on July 10th, 2014. Admitted for further management.    Consultants:  Dr Bjorn Pippin, urologist.   Procedures:  N/A.   Antibiotics:  Flagyl 06/30/13-07/01/13.  Ciprofloxacin 07/01/13-07/01/13.   HPI/Subjective: Asymptomatic.   Objective: Vital signs in last 24 hours: Temp:  [97.6 F (36.4 C)-98.4 F (36.9 C)] 98.3 F (36.8 C) (07/17 0800) Pulse Rate:  [67-84] 74 (07/17 0800) Resp:  [13-28] 21 (07/17 0800) BP: (95-148)/(51-84) 142/67 mmHg (07/17 0800) SpO2:  [96 %-100 %] 100 % (07/17 0800) Weight change:  Last BM Date: 59/17/14  Intake/Output from previous day: 07/16 0701 - 07/17 0700 In: 1800 [I.V.:1800] Out: 2200 [Urine:2200] Total I/O In: 315 [P.O.:240; I.V.:75] Out: -    Physical Exam: General: Comfortable, alert, communicative, fully oriented, not short of breath at rest.  HEENT:  Mild clinical pallor, no jaundice, no conjunctival injection or discharge. NECK:  Supple, JVP not seen, no carotid bruits, no palpable lymphadenopathy, no palpable goiter. CHEST:  Clinically clear to auscultation, no wheezes, no crackles. HEART:  Sounds 1 and 2 heard, normal, regular, no murmurs. ABDOMEN:  Full, soft, non-tender, no palpable organomegaly, no palpable masses, normal bowel sounds. GENITALIA:  Not examined. LOWER EXTREMITIES:  No pitting edema, palpable peripheral pulses. MUSCULOSKELETAL SYSTEM:  Unremarkable. CENTRAL NERVOUS SYSTEM:  No focal neurologic deficit on gross examination.  Lab Results:  Recent Labs  07/02/13 0404 07/03/13 0325  WBC 6.7 5.2  HGB 9.4* 8.7*  HCT 26.9* 25.3*  PLT 126* 145*    Recent Labs  07/02/13 0404 07/03/13 0325  NA 138 140  K 4.4 3.5  CL 110 111  CO2 18* 21  GLUCOSE 96 94  BUN 52* 25*  CREATININE 2.94* 1.30*  CALCIUM 8.7 8.4   Recent Results (from the past 240 hour(s))  CLOSTRIDIUM DIFFICILE BY PCR     Status: None    Collection Time    06/24/13 10:09 AM      Result Value Range Status   C difficile by pcr NEGATIVE  NEGATIVE Final  MRSA PCR SCREENING     Status: None   Collection Time    06/30/13  5:36 PM      Result Value Range Status   MRSA by PCR NEGATIVE  NEGATIVE Final   Comment:            The GeneXpert MRSA Assay (FDA     approved for NASAL specimens     only), is one component of a     comprehensive MRSA colonization     surveillance program. It is not     intended to diagnose MRSA     infection nor to guide or     monitor treatment for     MRSA infections.     Studies/Results: No results found.  Medications: Scheduled Meds: . antiseptic oral rinse  15 mL Mouth Rinse q12n4p  . chlorhexidine  15 mL Mouth Rinse BID  . pantoprazole (PROTONIX) IV  40 mg Intravenous Q12H  . polyethylene glycol  17 g Oral QID  . sodium chloride  3 mL Intravenous Q12H   Continuous Infusions: . sodium chloride 1,000 mL (07/01/13 2209)   PRN Meds:.clonazePAM, ondansetron (ZOFRAN) IV, ondansetron    LOS: 3 days   Karon Heckendorn,CHRISTOPHER  [facility] Pager [phone]. If 8PM-8AM, please contact night-coverage at [email or web address], password [email or web address] 07/03/2013, 9:08 AM  LOS: 3 days

## 2013-07-03 NOTE — Progress Notes (Signed)
Stable overnight following her endoscopy yesterday. Her hemoglobin did drop a bit, from 9.4 to 8.7, but there has been a significant drop in BUN during the same time. Her INR of 2.5 remains in the therapeutic range, and the patient is comfortable, specifically without abdominal pain.  Chest and heart unremarkable, abdomen soft and nontender, mental status okay.  Impression: Quiescent GI bleed in association with excessive anticoagulation, source most likely either her varices or portal gastropathy.  Recommendation: Advance diet and continue observation. If she remains stable, hopefully could go home in a day or 2.  Florencia Reasons, M.D. [phone]

## 2013-07-03 NOTE — Progress Notes (Signed)
CARE MANAGEMENT NOTE 07/03/2013  Patient:  Laura Farley, Laura Farley   Account Number:  [id number]  Date Initiated:  07/03/2013  Documentation initiated by:  DAVIS,RHONDA  Subjective/Objective Assessment:   pt admitted due to hyperanticoagulation -husband accidental gave double doses of coumadin,     Action/Plan:   home once stable   Anticipated DC Date:  07/06/2013   Anticipated DC Plan:  HOME/SELF CARE  In-house referral  NA      DC Planning Services  NA      [facility] Choice  NA   Choice offered to / List presented to:  NA   DME arranged  NA      DME agency  NA     HH arranged  NA      HH agency  NA   Status of service:  In process, will continue to follow Medicare Important Message given?  NA - LOS <3 / Initial given by admissions (If response is "NO", the following Medicare IM given date fields will be blank) Date Medicare IM given:   Date Additional Medicare IM given:    Discharge Disposition:    Per UR Regulation:  Reviewed for med. necessity/level of care/duration of stay  If discussed at Long Length of Stay Meetings, dates discussed:    Comments:  [id number] Earlene Plater, RN, BSN, CCM:  CHART REVIEWED AND UPDATED.  Next chart review due on [id number]. NO DISCHARGE NEEDS PRESENT AT THIS TIME. CASE MANAGEMENT [phone]

## 2013-07-04 ENCOUNTER — Telehealth: Payer: Self-pay | Admitting: Hematology & Oncology

## 2013-07-04 LAB — BASIC METABOLIC PANEL
BUN: 8 mg/dL (ref 6–23)
Chloride: 110 mEq/L (ref 96–112)
GFR calc Af Amer: 73 mL/min — ABNORMAL LOW (ref >90)
GFR calc non Af Amer: 63 mL/min — ABNORMAL LOW (ref >90)
Potassium: 3.2 mEq/L — ABNORMAL LOW (ref 3.5–5.1)
Sodium: 139 mEq/L (ref 135–145)

## 2013-07-04 LAB — CBC
HCT: 24.9 % — ABNORMAL LOW (ref 36.0–46.0)
MCHC: 34.9 g/dL (ref 30.0–36.0)
Platelets: 142 10*3/uL — ABNORMAL LOW (ref 150–400)
RDW: 16.3 % — ABNORMAL HIGH (ref 11.5–15.5)
WBC: 4.5 10*3/uL (ref 4.0–10.5)

## 2013-07-04 MED ORDER — ADULT MULTIVITAMIN W/MINERALS CH: 1.0000 | ORAL_TABLET | Freq: Every day | ORAL | Status: AC

## 2013-07-04 MED ORDER — ENSURE COMPLETE PO LIQD: 237.0000 mL | Freq: Two times a day (BID) | ORAL | Status: AC

## 2013-07-04 MED ORDER — OXYCODONE-ACETAMINOPHEN 5-325 MG PO TABS
1.0000 | ORAL_TABLET | Freq: Four times a day (QID) | ORAL | Status: DC | PRN
  Administered 2013-07-04: 1 via ORAL
  Filled 2013-07-04: qty 1

## 2013-07-04 NOTE — Telephone Encounter (Signed)
Left pt message to call and schedule appointment °

## 2013-07-04 NOTE — Progress Notes (Signed)
Patient ID: Laura Farley, female   DOB: [birth year], 57 y.o.   MRN: [id number] 2 Days Post-Op  Subjective: Laura Farley continues to improve.  Her creatinine has normalized and her urine remains clear.  I don't see the cytology that I ordered, so I will reorder that today.   She has no complaints today.  ROS: Negative except as above  Objective: Vital signs in last 24 hours: Temp:  [97.9 F (36.6 C)-98.3 F (36.8 C)] 97.9 F (36.6 C) (07/18 0631) Pulse Rate:  [64-74] 64 (07/18 0631) Resp:  [16-21] 16 (07/18 0631) BP: (116-142)/(62-68) 116/64 mmHg (07/18 0631) SpO2:  [100 %] 100 % (07/18 0631)  Intake/Output from previous day: 07/17 0701 - 07/18 0700 In: 1965 [P.O.:240; I.V.:1725] Out: 300 [Urine:300] Intake/Output this shift:    General appearance: alert and no distress  Lab Results:   Recent Labs  07/03/13 0325 07/04/13 0515  WBC 5.2 4.5  HGB 8.7* 8.7*  HCT 25.3* 24.9*  PLT 145* 142*   BMET  Recent Labs  07/03/13 0325 07/04/13 0515  NA 140 139  K 3.5 3.2*  CL 111 110  CO2 21 22  GLUCOSE 94 99  BUN 25* 8  CREATININE 1.30* 0.97  CALCIUM 8.4 8.1*   PT/INR  Recent Labs  07/02/13 0830 07/03/13 0325  LABPROT 24.2* 26.6*  INR 2.26* 2.55*   ABG No results found for this basename: PHART, PCO2, PO2, HCO3,  in the last 72 hours  Studies/Results: No results found.  Anti-infectives: Anti-infectives   Start     Dose/Rate Route Frequency Ordered Stop   06/30/13 1800  metroNIDAZOLE (FLAGYL) IVPB 500 mg  Status:  Discontinued     500 mg 100 mL/hr over 60 Minutes Intravenous Every 8 hours 06/30/13 1709 07/01/13 2019   06/30/13 1800  ciprofloxacin (CIPRO) IVPB 400 mg  Status:  Discontinued     400 mg 200 mL/hr over 60 Minutes Intravenous Every 24 hours 06/30/13 1738 07/01/13 2019      Current Facility-Administered Medications  Medication Dose Route Frequency Provider Last Rate Last Dose  . 0.9 %  sodium chloride infusion   Intravenous Continuous Dorothea Ogle, MD 75 mL/hr at 07/04/13 0053    . antiseptic oral rinse (BIOTENE) solution 15 mL  15 mL Mouth Rinse q12n4p Dorothea Ogle, MD      . chlorhexidine (PERIDEX) 0.12 % solution 15 mL  15 mL Mouth Rinse BID Dorothea Ogle, MD   15 mL at 07/03/13 2148  . clonazePAM (KLONOPIN) tablet 2 mg  2 mg Oral QHS PRN Gwenyth Bender, NP   2 mg at 07/03/13 2202  . feeding supplement (ENSURE COMPLETE) liquid 237 mL  237 mL Oral BID BM Reanne Vista Lawman, RD      . multivitamin with minerals tablet 1 tablet  1 tablet Oral Daily Lorraine Lax, RD   1 tablet at 07/03/13 1816  . ondansetron (ZOFRAN) tablet 4 mg  4 mg Oral Q6H PRN Penny Pia, MD       Or  . ondansetron [facility]) injection 4 mg  4 mg Intravenous Q6H PRN Penny Pia, MD   4 mg at 07/01/13 0954  . oxyCODONE-acetaminophen (PERCOCET/ROXICET) 5-325 MG per tablet 1 tablet  1 tablet Oral Q6H PRN Gwenyth Bender, NP   1 tablet at 07/04/13 0052  . pantoprazole (PROTONIX) injection 40 mg  40 mg Intravenous Q12H Laveda Norman, MD   40 mg at 07/03/13 2150  . sodium chloride  0.9 % injection 3 mL  3 mL Intravenous Q12H Penny Pia, MD   3 mL at 07/02/13 2157    Assessment: s/p Procedure(s): ESOPHAGOGASTRODUODENOSCOPY (EGD)  Her hematuria has resolved and her renal function has improved.  Plan: I have her scheduled for Tuesday for cystoscopy with right ureteroscopy to assess the source of the right renal bleeding.   I reviewed the risks of bleeding, infection, ureteral injury, need for stent or second procedures, thrombotic events and anesthetic complications.  I will reorder the cytology.      LOS: 4 days    Laura Farley 07/04/2013

## 2013-07-04 NOTE — Discharge Instructions (Signed)
1. Dr Bonney Leitz office will check your INR on 07/07/13. 2. Dr Annabell Howells has scheduled Cystoscopy for 07/08/13.
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
## 2013-07-04 NOTE — Discharge Summary (Signed)
Physician Discharge Summary  Laura Farley ZOX:[id number] DOB: May 15, 1956 DOA: 06/30/2013  PCP: Aura Dials, MD  Admit date: 06/30/2013 Discharge date: 07/04/2013  Time spent: 40 minutes  Recommendations for Outpatient Follow-up:  1. Follow up with primary MD. 2. For INR check at PMD's office on 07/07/13.  3. Follow up with Dr Bernette Redbird, GI on 07/10/13. 4. Follow up with Dr Annabell Howells, urologist. 5. Cystoscopy/Ureteroscopy is scheduled for 07/08/13  Discharge Diagnoses:  Principal Problem:   Warfarin-induced coagulopathy Active Problems:   Anemia due to blood loss, acute   Anxiety   Mesenteric vein thrombosis   Other and unspecified noninfectious gastroenteritis and colitis(558.9)   Leukocytosis, unspecified   Hematemesis   BRBPR (bright red blood per rectum)   Hematuria   Hypotension, unspecified   Discharge Condition: Satisfactory.   Diet recommendation: Regular.   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 She presented to [facility] as a direct admit after she was found to have persistent hematuria and elevated INR of 4.1. Patient reports that the morning of admission she had 2 episodes of bloody vomiting and one episode of BRBPR, hematuria. She was in urologist office, Dr. Wilson Singer and it was reported her SBP was in 80's. TRH called to facilitate admission to SDU. Repeat CBC on admission was significant for Hg 10.3 which is down from last baseline of 13.4 on July 10th, 2014. Admitted for further management.    Hospital Course:  1. GI Bleed: Patient presented with hematemesis and hematochezia on day of admission, in the setting of Warfarin-induced coagulopathy. 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 Per EMR,  reportedly, EGD at least 2 years ago, revealed esophageal varices. Hematemesis has since resolved, but as of 07/02/13, patient continued to have persistent melenic stools. PPI was commenced on 07/02/13. Dr Molly Maduro Buccini provided GI consultation, and performed an EGD on 07/02/13, which revealed no current signs of bleeding from the upper tract, although he opined that based on her presenting history of hematemesis, her varices and/or portal gastropathy may have played a role in the bleeding, even though they do not look very impressive at the present time. No further melena noted since.  2. Hematuria: Gross hematuria was noted originally in urologist's office on day of admission. This was associated with right flank pain. Dr Bjorn Pippin provided urology consultation and has opined that patient probably has right renal hemorrhage. He feels that the options are ureteroscopic management vs angiography with embolization, with caveats imposed by technical difficulties and ARF. Gross hematuria had cleared up by 07/02/13, and Dr Annabell Howells has scheduled a cystoscopy/ureteroscopy for 07/08/13.  3. Anemia due to blood loss, acute/Hemorrhagic shock: At presentation, HB was 10.2, SBP in the 80s (known baseline HB 13.4 on 06/26/13). This was due to concurrent GI/urinary tract hemorrhage, in setting of coagulopathy, with INR 4.63. Anticoagulants were placed on hold, and patient transfused 2 units of PRBC, as well as 2 units of FFP and aggressive ivi fluid administration. Patient has attained hemodynamic stability, and INR has improved, although still therapeutic. Following PT/INR. HB is 8.7, as of 07/04/13 and INR 2.55. 4. Mesenteric vein thrombosis: Patient has a known history of superior mesenteric vein thrombosis diagnosed in 05/2012, and following her last  hospitalization from 06/22/13-06/26/13, she was discharged on Coumadin/Lovenox, now both held per hemorrhagic complications, described above. Patient will likely need on-going  anticoagulation in the long term. Reinstatement and monitoring will be deferred to primary  MD. Have discussed with Dr Leavy Cella, covering for Dr Everlene Other on 07/04/13 (tel: [phone]), and she has assured me that INR check will be done on 07/07/13.  5. Acute kidney failure: On admission, creatinine was 6.85 (baseline creatinine 1.11 on 06/26/13), secondary to acute blood loss, hypovolemia and hypotension, as well as right renal hemorrhage. Managing with iv fluids as well as specific management of hemorrhagic diathesis and complications. Followed renal indices, creatinine trended down steadily and as of 07/04/13, had normalized at 0.97. ARF has resolved.  6. Other and unspecified noninfectious gastroenteritis and colitis: Hospitalized 06/22/13-06/26/13, for colitis, treated with Ciprofloxacin/Flagyl. Antibiotics concluded on 07/01/13.    Procedures:  See Below.   EGD on 07/02/13.   Consultations: Dr Bjorn Pippin, urologist Dr Bernette Redbird, GI.   Discharge Exam: Filed Vitals:   07/03/13 0800 07/03/13 1200 07/03/13 2106 07/04/13 0631  BP: 142/67 126/68 136/62 116/64  Pulse: 74  73 64  Temp: 98.3 F (36.8 C)  98.2 F (36.8 C) 97.9 F (36.6 C)  TempSrc: Oral  Oral Oral  Resp: 21  18 16   Height:      Weight:      SpO2: 100%  100% 100%   General: Comfortable, alert, communicative, fully oriented, not short of breath at rest.  HEENT: Mild clinical pallor, no jaundice, no conjunctival injection or discharge.  NECK: Supple, JVP not seen, no carotid bruits, no palpable lymphadenopathy, no palpable goiter.  CHEST: Clinically clear to auscultation, no wheezes, no crackles.  HEART: Sounds 1 and 2 heard, normal, regular, no murmurs.  ABDOMEN: Full, soft, non-tender, no palpable organomegaly, no palpable masses, normal bowel sounds.  GENITALIA: Not examined.  LOWER EXTREMITIES: No pitting edema, palpable peripheral pulses.  MUSCULOSKELETAL SYSTEM: Unremarkable.  CENTRAL NERVOUS SYSTEM: No focal neurologic  deficit on gross examination.  Discharge Instructions      Discharge Orders   Future Orders Complete By Expires     Diet general  As directed     Increase activity slowly  As directed         Medication List    STOP taking these medications       amoxicillin 500 MG capsule  Commonly known as:  AMOXIL     ciprofloxacin 500 MG tablet  Commonly known as:  CIPRO     enoxaparin 60 MG/0.6ML injection  Commonly known as:  LOVENOX     metroNIDAZOLE 500 MG tablet  Commonly known as:  FLAGYL     warfarin 5 MG tablet  Commonly known as:  COUMADIN      TAKE these medications       CALCIUM + D PO  Take 1 tablet by mouth every evening.     clonazePAM 2 MG tablet  Commonly known as:  KLONOPIN  Take 2 mg by mouth 2 (two) times daily as needed for anxiety.     feeding supplement Liqd  Take 237 mLs by mouth 2 (two) times daily between meals.     multivitamin with minerals Tabs  Take 1 tablet by mouth daily.     oxyCODONE 5 MG immediate release tablet  Commonly known as:  Oxy IR/ROXICODONE  Take 1 tablet (5 mg total) by mouth every 6 (six) hours as needed for pain.     PARoxetine 40 MG tablet  Commonly known as:  PAXIL  Take 40 mg by mouth every morning.     propranolol 20 MG tablet  Commonly known as:  INDERAL  Take 1 tablet (20 mg total) by mouth 2 (two) times daily.     PROTONIX 40 MG tablet  Generic drug:  pantoprazole  Take 40 mg by mouth daily.     spironolactone 50 MG tablet  Commonly known as:  ALDACTONE  Take 1 tablet (50 mg total) by mouth daily.       Allergies  Allergen Reactions  . Bee Venom Swelling    "Throat swells up" per pt  . Venlafaxine Hcl Other (See Comments)    Shortness of breath, irritable and mean   Follow-up Information   Schedule an appointment as soon as possible for a visit with Aura Dials, MD.   Contact information:   [address] [place] [address] [phone]       Follow up with Florencia Reasons, MD.  (Appointment has been scheduled for 07/10/13 at 8:00 AM. )    Contact information:   [address] [place]                         [place] [address] [phone]       Call Anner Crete, MD.   Contact information:   [address] [place] [address] [phone]        The results of significant diagnostics from this hospitalization (including imaging, microbiology, ancillary and laboratory) are listed below for reference.    Significant Diagnostic Studies: Ct Abdomen W Contrast  06/11/2013   *RADIOLOGY REPORT*  Clinical Data: Periumbilical abdominal pain, nausea and vomiting. History of colon cancer diagnosed 2009.  CT ABDOMEN WITH CONTRAST  Technique:  Multidetector CT imaging of the abdomen was performed following the standard protocol during bolus administration of intravenous contrast.  Contrast: 70mL OMNIPAQUE IOHEXOL 300 MG/ML  SOLN  Comparison: 05/22/2013 CT abdomen/pelvis  Findings: Lung bases are clear.  Esophageal varices are redemonstrated.  Apparent interval resolution of previously seen perihepatic ascites.  There has been interval reduction in nonocclusive portal venous thrombus extension to the superior mesenteric vein, for example image 29, now with more peripheral and linear orientation in keeping with at least subacute chronicity.  The liver is minimally inhomogeneous without new focal abnormality. Gallbladder, right kidney, and adrenal glands are normal.  Peripheral wedge-shaped inhomogeneous hypodensity at the superior portion of the spleen image 12 is noted.  There is opacification of the splenic vein but a small thrombus could be obscured due to technique.  Pancreatic head and uncinate process calcification could indicate previous pancreatitis.  The pancreas enhances homogeneously with minimal prominence of the duct at the level of the pancreatic head measuring 3 mm maximally.  3 mm too small to characterize left renal cortical  hypodensity image 25 is stable.  Trace left upper quadrant stranding / fluid noted.  Possible injection site within the subcutaneous tissues of the left upper quadrant, image 26.  Degenerative changes are noted in the spine.  IMPRESSION: Reduction in the portal venous thrombosis clot burden with apparent evolution given its linear and more eccentrically positioned location within the portal vein and superior mesenteric vein.  Inhomogeneous enhancement at the superior margin of the spleen which has been prior exams and could represent hypo enhancement relative to focal thrombosis although this could be related to vascular congestion or phase of enhancement.  Sequela of probable previous pancreatitis.  No acute intra-abdominal abnormality.   Original Report Authenticated By: Christiana Pellant, M.D.   Ct Abdomen Pelvis W Contrast  06/23/2013   *RADIOLOGY REPORT*  Clinical Data: Abdominal pain for 4 days.  Blood in the urine.  CT ABDOMEN AND PELVIS WITH CONTRAST  Technique:  Multidetector CT imaging of the abdomen and pelvis was performed following the standard protocol during bolus administration of intravenous contrast.  Contrast: OMNIPAQUE IOHEXOL 300 MG/ML  SOLN  Comparison: 06/11/2013  Findings: Respiratory motion limits visualization of the lung bases.  There is thickening of the wall of the distal esophagus with contrast material in the esophagus suggesting reflux disease. Para esophageal varices are present.  Interval improvement of thrombosis in the mesenteric veins.  The liver, spleen, gallbladder, adrenal glands, kidneys, abdominal aorta, and inferior vena cava are unremarkable.  Calcification in the head of the pancreas probably due to old chronic pancreatitis. No significant pancreatic ductal dilatation.  Calcification of the abdominal aorta without aneurysm.  The stomach is not abnormally distended.  There is wall thickening in the ileum which could represent enteritis or other infiltrative process.  Focal fluid in the mesentery.  Given the presence of mesenteric vein thrombosis, ischemia is not excluded.  There is no evidence of pneumatosis or portal venous gas.  No small bowel distension.  Stool filled colon without evidence of wall thickening. No free air in the abdomen.  Pelvis:  Uterus appears to be surgically absent.  No abnormal adnexal masses.  Bladder wall is not thickened.  Stool filled rectosigmoid colon without diverticulitis.  Appendix is not identified.  No free or loculated pelvic fluid collections.  No significant pelvic lymphadenopathy.  Degenerative changes in the lumbar spine.  Spondylolysis and spondylolisthesis at L5-S1.  IMPRESSION: Improving mesenteric vein thrombosis is demonstrated.  However, there is wall thickening in the ileum with interloop fluid collection.  This could represent enteritis although given the previous mesenteric thrombus, bowel ischemia is not excluded. Calcification in the pancreatic head suggesting chronic pancreatitis.  Changes suggesting the lower esophageal reflux disease with esophageal varices.   Original Report Authenticated By: Burman Nieves, M.D.    Microbiology: Recent Results (from the past 240 hour(s))  MRSA PCR SCREENING     Status: None   Collection Time    06/30/13  5:36 PM      Result Value Range Status   MRSA by PCR NEGATIVE  NEGATIVE Final   Comment:            The GeneXpert MRSA Assay (FDA     approved for NASAL specimens     only), is one component of a     comprehensive MRSA colonization     surveillance program. It is not     intended to diagnose MRSA     infection nor to guide or     monitor treatment for     MRSA infections.     Labs: Basic Metabolic Panel:  Recent Labs Lab 06/30/13 1355 07/01/13 0155 07/02/13 0404 07/03/13 0325 07/04/13 0515  NA 130* 133* 138 140 139  K 3.8 3.7 4.4 3.5 3.2*  CL 87* 97 110 111 110  CO2 29 25 18* 21 22  GLUCOSE 106* 99 96 94 99  BUN 54* 66* 52* 25* 8  CREATININE 6.85*  6.07* 2.94* 1.30* 0.97  CALCIUM 8.7 7.5* 8.7 8.4 8.1*   Liver Function Tests:  Recent Labs Lab 06/30/13 1355 07/01/13 0155  AST 26 22  ALT  29 20  ALKPHOS 130* 94  BILITOT 0.3 0.2*  PROT 6.1 4.8*  ALBUMIN 3.1* 2.5*   No results found for this basename: LIPASE, AMYLASE,  in the last 168 hours No results found for this basename: AMMONIA,  in the last 168 hours CBC:  Recent Labs Lab 06/30/13 1355  07/01/13 1040 07/01/13 1629 07/02/13 0404 07/03/13 0325 07/04/13 0515  WBC 15.2*  < > 8.6 7.7 6.7 5.2 4.5  NEUTROABS 9.4*  --   --   --   --   --   --   HGB 10.6*  < > 9.4* 9.0* 9.4* 8.7* 8.7*  HCT 31.5*  < > 26.9* 25.7* 26.9* 25.3* 24.9*  MCV 95.7  < > 93.4 92.4 93.4 93.4 93.6  PLT 312  < > 118* 114* 126* 145* 142*  < > = values in this interval not displayed. Cardiac Enzymes: No results found for this basename: CKTOTAL, CKMB, CKMBINDEX, TROPONINI,  in the last 168 hours BNP: BNP (last 3 results) No results found for this basename: PROBNP,  in the last 8760 hours CBG: No results found for this basename: GLUCAP,  in the last 168 hours     Signed:  Kerrie Latour,CHRISTOPHER  [facility] 07/04/2013, 1:46 PM

## 2013-07-04 NOTE — Progress Notes (Signed)
Patient discharge to home, husband at bedside. PIV removed no s/s of swelling or infiltration on insertion site. Discharge instructions and follow up appoitnments discussed with patient verbalized understanding.  No complaints of any pain or discomfort upon discharge.

## 2013-07-04 NOTE — Progress Notes (Signed)
The patient indicates she has had several stools over the past 24 hours, but they looked normal to her.  Her hemoglobin is stable from yesterday at 8.7, and her BUN is all the way down to 8.  She denies abdominal pain, and is tolerating a solid diet.  Impression: Quiescent upper GI bleed associated with over anticoagulation, most likely origin being her small esophageal varices and/or her portal gastropathy.  Recommendation: Okay for discharge from my standpoint. I have made the patient an appointment to see me in the office on July 24 at 8 AM to monitor her blood count.  Will sign off at this time.  Call me if questions.  Florencia Reasons, M.D. [phone]

## 2013-07-07 ENCOUNTER — Telehealth: Payer: Self-pay | Admitting: Hematology & Oncology

## 2013-07-07 ENCOUNTER — Other Ambulatory Visit: Payer: Self-pay | Admitting: Urology

## 2013-07-07 NOTE — Telephone Encounter (Signed)
Left message to call and schedule appointment

## 2013-07-08 ENCOUNTER — Encounter (HOSPITAL_COMMUNITY): Payer: Self-pay | Admitting: Pharmacy Technician

## 2013-07-08 ENCOUNTER — Telehealth: Payer: Self-pay | Admitting: Hematology & Oncology

## 2013-07-08 NOTE — Telephone Encounter (Signed)
Left message for pt to call and schedule appointment. Heidi at referring aware pt has not called back.

## 2013-07-09 ENCOUNTER — Encounter (HOSPITAL_COMMUNITY): Payer: Self-pay

## 2013-07-09 ENCOUNTER — Ambulatory Visit (HOSPITAL_COMMUNITY)
Admission: RE | Admit: 2013-07-09 | Discharge: 2013-07-09 | Disposition: A | Discharge status: Home / Self Care | Payer: Medicare Other | Source: Ambulatory Visit | Attending: Urology | Admitting: Urology

## 2013-07-09 ENCOUNTER — Encounter (HOSPITAL_COMMUNITY)
Admission: RE | Admit: 2013-07-09 | Discharge: 2013-07-09 | Disposition: A | Discharge status: Home / Self Care | Payer: Medicare Other | Source: Ambulatory Visit | Attending: Urology | Admitting: Urology

## 2013-07-09 DIAGNOSIS — I700 Atherosclerosis of aorta: Secondary | ICD-10-CM | POA: Insufficient documentation

## 2013-07-09 DIAGNOSIS — I10 Essential (primary) hypertension: Secondary | ICD-10-CM | POA: Insufficient documentation

## 2013-07-09 DIAGNOSIS — Z01818 Encounter for other preprocedural examination: Secondary | ICD-10-CM | POA: Insufficient documentation

## 2013-07-09 DIAGNOSIS — Z01812 Encounter for preprocedural laboratory examination: Secondary | ICD-10-CM | POA: Insufficient documentation

## 2013-07-09 DIAGNOSIS — N2889 Other specified disorders of kidney and ureter: Secondary | ICD-10-CM | POA: Insufficient documentation

## 2013-07-09 HISTORY — DX: Depression, unspecified: F32.A

## 2013-07-09 HISTORY — DX: Anxiety disorder, unspecified: F41.9

## 2013-07-09 HISTORY — DX: Noninfective gastroenteritis and colitis, unspecified: K52.9

## 2013-07-09 HISTORY — DX: Major depressive disorder, single episode, unspecified: F32.9

## 2013-07-09 HISTORY — DX: Portal vein thrombosis: I81

## 2013-07-09 HISTORY — DX: Anemia, unspecified: D64.9

## 2013-07-09 HISTORY — DX: Chronic kidney disease, unspecified: N18.9

## 2013-07-09 HISTORY — DX: Personal history of other diseases of the digestive system: Z87.19

## 2013-07-09 LAB — CBC
MCH: 32.1 pg (ref 26.0–34.0)
MCHC: 33.2 g/dL (ref 30.0–36.0)
Platelets: 305 10*3/uL (ref 150–400)

## 2013-07-09 LAB — PROTIME-INR: INR: 1.03 (ref 0.00–1.49)

## 2013-07-09 LAB — COMPREHENSIVE METABOLIC PANEL
ALT: 25 U/L (ref 0–35)
AST: 33 U/L (ref 0–37)
Albumin: 3.4 g/dL — ABNORMAL LOW (ref 3.5–5.2)
Calcium: 9.7 mg/dL (ref 8.4–10.5)
Potassium: 3.5 mEq/L (ref 3.5–5.1)
Sodium: 141 mEq/L (ref 135–145)
Total Protein: 6.2 g/dL (ref 6.0–8.3)

## 2013-07-09 NOTE — Patient Instructions (Signed)
YOUR SURGERY IS SCHEDULED AT [facility]  ON:  Thursday  7/24  REPORT TO [facility] AT: 7:00 AM      PHONE # FOR SHORT STAY IS [phone]  DO NOT EAT OR DRINK ANYTHING AFTER MIDNIGHT THE NIGHT BEFORE YOUR SURGERY.  YOU MAY BRUSH YOUR TEETH, RINSE OUT YOUR MOUTH--BUT NO WATER, NO FOOD, NO CHEWING GUM, NO MINTS, NO CANDIES, NO CHEWING TOBACCO.  PLEASE TAKE THE FOLLOWING MEDICATIONS THE AM OF YOUR SURGERY WITH A FEW SIPS OF WATER:  AMLODIPINE, KLONOPIN, OXYCODONE IF NEEDED FOR PAIN, PROTONIX, INDERAL    DO NOT BRING VALUABLES, MONEY, CREDIT CARDS.  DO NOT WEAR JEWELRY, MAKE-UP, NAIL POLISH AND NO METAL PINS OR CLIPS IN YOUR HAIR. CONTACT LENS, DENTURES / PARTIALS, GLASSES SHOULD NOT BE WORN TO SURGERY AND IN MOST CASES-HEARING AIDS WILL NEED TO BE REMOVED.  BRING YOUR GLASSES CASE, ANY EQUIPMENT NEEDED FOR YOUR CONTACT LENS. FOR PATIENTS ADMITTED TO THE HOSPITAL--CHECK OUT TIME THE DAY OF DISCHARGE IS 11:00 AM.  ALL INPATIENT ROOMS ARE PRIVATE - WITH BATHROOM, TELEPHONE, TELEVISION AND WIFI INTERNET.  IF YOU ARE BEING DISCHARGED THE SAME DAY OF YOUR SURGERY--YOU CAN NOT DRIVE YOURSELF HOME--AND SHOULD NOT GO HOME ALONE BY TAXI OR BUS.  NO DRIVING OR OPERATING MACHINERY FOR 24 HOURS FOLLOWING ANESTHESIA / PAIN MEDICATIONS.  PLEASE MAKE ARRANGEMENTS FOR SOMEONE TO BE WITH YOU AT HOME THE FIRST 24 HOURS AFTER SURGERY. RESPONSIBLE DRIVER'S NAME MERLE Isaacson                                               PHONE #   [phone]                                FAILURE TO FOLLOW THESE INSTRUCTIONS MAY RESULT IN THE CANCELLATION OF YOUR SURGERY.   PATIENT SIGNATURE_________________________________

## 2013-07-09 NOTE — Pre-Procedure Instructions (Signed)
Pt's CBC report faxed to Dr. Belva Crome office and Lossie Faes at his office notified of the faxed report and pt's hgb today is 9.5.

## 2013-07-09 NOTE — Pre-Procedure Instructions (Signed)
EKG REPORT IN EPIC FROM 05/20/13.  CXR WAS DONE TODAY - PREOP AT [facility].

## 2013-07-10 ENCOUNTER — Encounter (HOSPITAL_COMMUNITY): Payer: Self-pay | Admitting: *Deleted

## 2013-07-10 ENCOUNTER — Encounter (HOSPITAL_COMMUNITY)
Admission: RE | Disposition: A | Discharge status: Home / Self Care | Payer: Self-pay | Source: Ambulatory Visit | Attending: Urology

## 2013-07-10 ENCOUNTER — Ambulatory Visit (HOSPITAL_COMMUNITY): Payer: Medicare Other | Admitting: Anesthesiology

## 2013-07-10 ENCOUNTER — Ambulatory Visit (HOSPITAL_COMMUNITY)
Admission: RE | Admit: 2013-07-10 | Discharge: 2013-07-10 | Disposition: A | Discharge status: Home / Self Care | Payer: Medicare Other | Source: Ambulatory Visit | Attending: Urology | Admitting: Urology

## 2013-07-10 ENCOUNTER — Encounter (HOSPITAL_COMMUNITY): Payer: Self-pay | Admitting: Anesthesiology

## 2013-07-10 DIAGNOSIS — D689 Coagulation defect, unspecified: Secondary | ICD-10-CM | POA: Insufficient documentation

## 2013-07-10 DIAGNOSIS — R319 Hematuria, unspecified: Secondary | ICD-10-CM

## 2013-07-10 DIAGNOSIS — K219 Gastro-esophageal reflux disease without esophagitis: Secondary | ICD-10-CM | POA: Insufficient documentation

## 2013-07-10 DIAGNOSIS — Z85038 Personal history of other malignant neoplasm of large intestine: Secondary | ICD-10-CM | POA: Insufficient documentation

## 2013-07-10 DIAGNOSIS — R310 Gross hematuria: Secondary | ICD-10-CM | POA: Insufficient documentation

## 2013-07-10 DIAGNOSIS — F17200 Nicotine dependence, unspecified, uncomplicated: Secondary | ICD-10-CM | POA: Insufficient documentation

## 2013-07-10 DIAGNOSIS — Z01812 Encounter for preprocedural laboratory examination: Secondary | ICD-10-CM | POA: Insufficient documentation

## 2013-07-10 DIAGNOSIS — Z79899 Other long term (current) drug therapy: Secondary | ICD-10-CM | POA: Insufficient documentation

## 2013-07-10 DIAGNOSIS — Z7901 Long term (current) use of anticoagulants: Secondary | ICD-10-CM | POA: Insufficient documentation

## 2013-07-10 HISTORY — PX: CYSTOSCOPY WITH RETROGRADE PYELOGRAM, URETEROSCOPY AND STENT PLACEMENT: SHX5789

## 2013-07-10 SURGERY — CYSTOURETEROSCOPY, WITH RETROGRADE PYELOGRAM AND STENT INSERTION
Anesthesia: General | Laterality: Right | Wound class: Clean Contaminated

## 2013-07-10 MED ORDER — ONDANSETRON HCL 4 MG/2ML IJ SOLN
INTRAMUSCULAR | Status: DC | PRN
  Administered 2013-07-10: 4 mg via INTRAVENOUS

## 2013-07-10 MED ORDER — PROMETHAZINE HCL 25 MG/ML IJ SOLN: 6.2500 mg | INTRAMUSCULAR | Status: DC | PRN

## 2013-07-10 MED ORDER — HYDROMORPHONE HCL PF 1 MG/ML IJ SOLN
INTRAMUSCULAR | Status: AC
  Filled 2013-07-10: qty 1

## 2013-07-10 MED ORDER — CIPROFLOXACIN IN D5W 400 MG/200ML IV SOLN
INTRAVENOUS | Status: AC
  Filled 2013-07-10: qty 200

## 2013-07-10 MED ORDER — HYDROMORPHONE HCL PF 1 MG/ML IJ SOLN
0.2500 mg | INTRAMUSCULAR | Status: DC | PRN
  Administered 2013-07-10: 0.5 mg via INTRAVENOUS

## 2013-07-10 MED ORDER — OXYCODONE HCL 5 MG PO TABS: 5.0000 mg | ORAL_TABLET | Freq: Once | ORAL | Status: DC | PRN

## 2013-07-10 MED ORDER — MEPERIDINE HCL 50 MG/ML IJ SOLN: 6.2500 mg | INTRAMUSCULAR | Status: DC | PRN

## 2013-07-10 MED ORDER — FENTANYL CITRATE 0.05 MG/ML IJ SOLN
INTRAMUSCULAR | Status: DC | PRN
  Administered 2013-07-10: 50 ug via INTRAVENOUS
  Administered 2013-07-10: 25 ug via INTRAVENOUS

## 2013-07-10 MED ORDER — CIPROFLOXACIN IN D5W 400 MG/200ML IV SOLN
400.0000 mg | INTRAVENOUS | Status: AC
  Administered 2013-07-10: 400 mg via INTRAVENOUS

## 2013-07-10 MED ORDER — OXYCODONE HCL 5 MG/5ML PO SOLN
5.0000 mg | Freq: Once | ORAL | Status: DC | PRN
  Filled 2013-07-10: qty 5

## 2013-07-10 MED ORDER — IOHEXOL 300 MG/ML  SOLN
INTRAMUSCULAR | Status: DC | PRN
  Administered 2013-07-10: 10 mL

## 2013-07-10 MED ORDER — ACETAMINOPHEN 10 MG/ML IV SOLN
1000.0000 mg | Freq: Once | INTRAVENOUS | Status: DC | PRN
  Filled 2013-07-10: qty 100

## 2013-07-10 MED ORDER — BELLADONNA ALKALOIDS-OPIUM 16.2-60 MG RE SUPP
RECTAL | Status: AC
  Filled 2013-07-10: qty 1

## 2013-07-10 MED ORDER — SODIUM CHLORIDE 0.9 % IR SOLN
Status: DC | PRN
  Administered 2013-07-10: 3000 mL via INTRAVESICAL

## 2013-07-10 MED ORDER — IOHEXOL 300 MG/ML  SOLN
INTRAMUSCULAR | Status: AC
  Filled 2013-07-10: qty 1

## 2013-07-10 MED ORDER — LACTATED RINGERS IV SOLN
INTRAVENOUS | Status: DC
  Administered 2013-07-10: 1000 mL via INTRAVENOUS

## 2013-07-10 MED ORDER — PROPOFOL 10 MG/ML IV BOLUS
INTRAVENOUS | Status: DC | PRN
  Administered 2013-07-10: 160 mg via INTRAVENOUS

## 2013-07-10 MED ORDER — MIDAZOLAM HCL 5 MG/5ML IJ SOLN
INTRAMUSCULAR | Status: DC | PRN
  Administered 2013-07-10: 1 mg via INTRAVENOUS

## 2013-07-10 SURGICAL SUPPLY — 11 items
BAG URO CATCHER STRL LF (DRAPE) ×2 IMPLANT
CATH URET 5FR 28IN OPEN ENDED (CATHETERS) ×2 IMPLANT
CLOTH BEACON ORANGE TIMEOUT ST (SAFETY) ×2 IMPLANT
DRAPE CAMERA CLOSED 9X96 (DRAPES) ×2 IMPLANT
GLOVE SURG SS PI 8.0 STRL IVOR (GLOVE) ×2 IMPLANT
GOWN STRL REIN XL XLG (GOWN DISPOSABLE) ×2 IMPLANT
GUIDEWIRE STR DUAL SENSOR (WIRE) ×1 IMPLANT
MANIFOLD NEPTUNE II (INSTRUMENTS) ×2 IMPLANT
PACK CYSTO (CUSTOM PROCEDURE TRAY) ×2 IMPLANT
SHEATH ACCESS URETERAL 38CM (SHEATH) ×1 IMPLANT
TUBING CONNECTING 10 (TUBING) ×2 IMPLANT

## 2013-07-10 NOTE — Brief Op Note (Signed)
07/10/2013  9:52 AM  PATIENT:  Laura Farley  57 y.o. female  PRE-OPERATIVE DIAGNOSIS:  RIGHT RENAL BLEEDING  POST-OPERATIVE DIAGNOSIS:  RIGHT RENAL BLEEDING WITH NEGATIVE RETROGRADE  PROCEDURE:  Procedure(s): CYSTOSCOPY WITH RIGHT RETROGRADE PYELOGRAM (Right) Right renal pelvic washings.   SURGEON:  Surgeon(s) and Role:    * Anner Crete, MD - Primary  PHYSICIAN ASSISTANT:   ASSISTANTS: none   ANESTHESIA:   general  EBL:     BLOOD ADMINISTERED:none  DRAINS: none   LOCAL MEDICATIONS USED:  NONE  SPECIMEN:  Source of Specimen:  right renal pelvic washings  DISPOSITION OF SPECIMEN:  PATHOLOGY  COUNTS:  YES  TOURNIQUET:  * No tourniquets in log *  DICTATION: .Other Dictation: Dictation Number [id number]  PLAN OF CARE: Discharge to home after PACU  PATIENT DISPOSITION:  PACU - hemodynamically stable.   Delay start of Pharmacological VTE agent (>24hrs) due to surgical blood loss or risk of bleeding: yes

## 2013-07-10 NOTE — H&P (View-Only) (Signed)
ctive Problems Problems  1. Coagulation Defects 286.9 2. Gross Hematuria 599.71  History of Present Illness     Laura Farley is a 57yo WF who is sent in consultation for gross hematuria.  She has a complicated medical history with colon cancer treated in 1997 with surgery and chemo but no radiation, alcohol abuse with cirrhosis and esophageal varicies.  She has a recent history of mesenteric vein thrombosis and is on both coumadin and lovenox.  She now reports a 2 week history of gross hematuria.  She has had several recent CT scans for a bout of enterocolitis but her kidneys and bladder were unremarkable on the scans.  She has mid abdominal pain but no dysuria, irritative voiding symptoms or flank pain today.   I have reviewed extensive records, imaging and labs in [facility] and her Hgb was 13.4 on 7/10.  She has a Cr of 1.1.  Her PT was 20.1 with an INR of 1.77.   She has not had a history of UTI's, stones or prior bleeding.   She has not had a culture but had only TNTC RBC's on UA and is on cipro currently.   She is hypotensive today at 85/60 and an initial level of 66/39.   He repeat labs from this morning showed a PT of 39.2 and an INR of 4.35.   Past Medical History Problems  1. History of  Abdominal Pain 789.00 2. History of  Anxiety (Symptom) 300.00 3. History of  Breast Pain 611.71 4. History of  Cirrhosis 571.5 5. History of  Colon Cancer V10.05 6. History of  Depression With Anxiety 300.4 7. History of  Esophageal Reflux 530.81 8. History of  Esophageal Varices 456.1 9. History of  Hematuria 599.70 10. History of  Hypertension 401.9 11. History of  Nicotine Dependence 305.1 12. History of  Seizure 13. History of  Venous Thrombosis V12.51  Surgical History Problems  1. History of  Colon Surgery 2. History of  Hysterectomy V45.77  Current Meds 1. AmLODIPine Besylate 10 MG Oral Tablet; Therapy: (Recorded:14Jul2014) to 2. Amoxicillin 500 MG Oral Capsule; Therapy: (Recorded:14Jul2014)  to 3. Calcium + D TABS; Therapy: (Recorded:03Jul2014) to 4. Ciprofloxacin HCl 500 MG Oral Tablet; Therapy: (Recorded:14Jul2014) to 5. ClonazePAM 2 MG Oral Tablet; Therapy: (Recorded:03Jul2014) to 6. Coumadin 5 MG Oral Tablet; Therapy: (Recorded:03Jul2014) to 7. Dexilant 60 MG Oral Capsule Delayed Release; Therapy: (Recorded:03Jul2014) to 8. Enoxaparin Sodium 60 MG/0.6ML Subcutaneous Solution; Therapy: 07Jun2014 to 9. Gabapentin 300 MG Oral Capsule; Therapy: (Recorded:03Jul2014) to 10. Lisinopril 40 MG Oral Tablet; Therapy: (Recorded:14Jul2014) to 11. MetroNIDAZOLE 500 MG Oral Tablet; Therapy: (Recorded:14Jul2014) to 12. OxyCODONE HCl 5 MG Oral Tablet; Therapy: (Recorded:03Jul2014) to 13. Pantoprazole Sodium 40 MG Oral Tablet Delayed Release; Therapy: (Recorded:03Jul2014) to 14. PARoxetine HCl 40 MG Oral Tablet; Therapy: (Recorded:14Jul2014) to 15. Paxil 40 MG Oral Tablet; Therapy: (Recorded:03Jul2014) to 16. ProAir HFA 108 (90 Base) MCG/ACT Inhalation Aerosol Solution; Therapy: (Recorded:03Jul2014)   to 17. Propranolol HCl 20 MG Oral Tablet; Therapy: (Recorded:03Jul2014) to 18. Spironolactone 50 MG Oral Tablet; Therapy: (Recorded:03Jul2014) to 19. Warfarin Sodium 5 MG Oral Tablet; takes 1 tablet daily; Therapy: (Recorded:14Jul2014) to  Allergies Medication  1. No Known Drug Allergies  Family History Problems  1. Family history of  Death In The Family Father age 49 of infection in teeth 2. Family history of  Death In The Family Mother age 41 of staph infectin 3. Family history of  Family Health Status Number Of Children 1 son   1 daughter  Social History Problems    Caffeine Use   Occupation: housewife   Recovering Alcoholic   Tobacco Use 305.1 1/2 ppd x 30 years Denied    History of  Alcohol Use  Review of Systems Genitourinary, constitutional, skin, eye, otolaryngeal, hematologic/lymphatic, cardiovascular, pulmonary, endocrine, musculoskeletal, gastrointestinal,  neurological and psychiatric system(s) were reviewed and pertinent findings if present are noted.  Genitourinary: urinary frequency, urinary urgency, nocturia, incontinence and hematuria.  Gastrointestinal: nausea, vomiting, heartburn and diarrhea.  Constitutional: night sweats and feeling tired (fatigue).  Integumentary: skin rash/lesion.  Hematologic/Lymphatic: a tendency to easily bruise.  Endocrine: polydipsia.  Musculoskeletal: back pain.  Neurological: dizziness and headache.  Psychiatric: depression and anxiety.    Vitals Vital Signs [Data Includes: Last 1 Day]  14Jul2014 02:37PM  Blood Pressure: 94 / 69 Temperature: 98.5 F Heart Rate: 81 14Jul2014 01:30PM  Blood Pressure: 85 / 60 Temperature: 98.1 F 14Jul2014 12:58PM  BMI Calculated: 22.37 BSA Calculated: 1.53 Weight: 120 lb  Blood Pressure: 66 / 39 Temperature: 98.1 F Heart Rate: 75 14Jul2014 12:57PM  Height: 5 ft 1.5 in  Physical Exam Constitutional: Well nourished and well developed . No acute distress.  ENT:. The ears and nose are normal in appearance.  Neck: The appearance of the neck is normal and no neck mass is present.  Pulmonary: No respiratory distress and normal respiratory rhythm and effort.  Cardiovascular: Heart rate and rhythm are normal . No peripheral edema.  Abdomen: The abdomen is soft and nontender. No masses are palpated. No CVA tenderness. No hernias are palpable. No hepatosplenomegaly noted.  Genitourinary:   Examination of the external genitalia shows vulvar atrophy. The urethra is is stenotic. Vaginal exam demonstrates atrophy.  Lymphatics: The supraclavicular, axillary, femoral and inguinal nodes are not enlarged or tender.  Skin: Normal skin turgor and no visible rash . Multiple bruises.  Neuro/Psych:. Mood and affect are appropriate. Normal sensation of the perineum/perianal region (S3,4,5).    Results/Data Urine [Data Includes: Last 1 Day]   14Jul2014  COLOR RED   APPEARANCE TURBID    SQUAMOUS EPITHELIAL/HPF NONE SEEN   WBC NONE SEEN WBC/hpf  RBC TNTC RBC/hpf  BACTERIA NONE SEEN   CRYSTALS NONE SEEN   CASTS NONE SEEN    Old records or history reviewed: I have reviewed extensive notes in epic including recent hospital notes.  The following images/tracing/specimen were independently visualized:  I have reviewed several CT films.  The following clinical lab reports were reviewed:  I have reviewed her recent labs.    Procedure  Procedure: Cystoscopy  Chaperone Present: Cindee Lame.  Indication: Hematuria.  Prep: The patient was prepped with betadine.  Procedure Note:  Urethral meatus:. 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  Anterior urethra: No abnormalities.  Bladder: The right ureteral orifice was in the normal anatomic position and had bloody efflux. The left ureteral orifice was in the normal anatomic position and had clear efflux of urine. A systematic survey of the bladder demonstrated no bladder tumors or stones. The mucosa was smooth without abnormalities. Examination of the bladder demonstrated a small amount of fresh clot within the bladder (with brisk bleeding from the right UO. ). The patient tolerated the procedure well.  Complications: None.    Assessment Assessed  1. Gross Hematuria 599.71 2. Coagulation Defects 286.9   She has gross hematuria from the right upper tract.   The CT is reports as normal, but there maybe mild enhancement of the renal pelvis.  Plan Gross Hematuria (599.71)  1. URINE CULTURE  Done: 14Jul2014      She needs to be admitted to the hospital for medical management and cessation of anticoagulation. She may need angiography to see if that will clarify the source of bleeding,  If the bleeding stops with cessation of anticoagulation, she will need ureteroscopy for diagnosis.  I have contacted [facility] and will have them admit her when she gets to the hospital and I will see  in consultation.   Discussion/Summary  CC: Emmaline Life PA at [place].

## 2013-07-10 NOTE — Interval H&P Note (Signed)
History and Physical Interval Note:  Recent UA was unremarkable.      07/10/2013 8:52 AM  Laura Farley  has presented today for surgery, with the diagnosis of RIGHT RENAL BLEEDING  The various methods of treatment have been discussed with the patient and family. After consideration of risks, benefits and other options for treatment, the patient has consented to  Procedure(s): CYSTOSCOPY WITH RIGHT RETROGRADE PYELOGRAM, RIGHT URETEROSCOPY AND POSSIBLE BIOPSY AND STENT PLACEMENT (Right) as a surgical intervention .  The patient's history has been reviewed, patient examined, no change in status, stable for surgery.  I have reviewed the patient's chart and labs.  Questions were answered to the patient's satisfaction.     Carlene Bickley J

## 2013-07-10 NOTE — Anesthesia Postprocedure Evaluation (Signed)
Anesthesia Post Note  Patient: Laura Farley  Procedure(s) Performed: Procedure(s) (LRB): CYSTOSCOPY WITH RIGHT RETROGRADE PYELOGRAM (Right)  Anesthesia type: General  Patient location: PACU  Post pain: Pain level controlled  Post assessment: Post-op Vital signs reviewed  Last Vitals: BP 147/81  Pulse 74  Temp(Src) 36.1 C (Oral)  Resp 16  SpO2 97%  Post vital signs: Reviewed  Level of consciousness: sedated  Complications: No apparent anesthesia complications

## 2013-07-10 NOTE — Transfer of Care (Signed)
Immediate Anesthesia Transfer of Care Note  Patient: Laura Farley  Procedure(s) Performed: Procedure(s): CYSTOSCOPY WITH RIGHT RETROGRADE PYELOGRAM (Right)  Patient Location: PACU  Anesthesia Type:General  Level of Consciousness: awake, sedated and patient cooperative  Airway & Oxygen Therapy: Patient Spontanous Breathing and Patient connected to face mask oxygen  Post-op Assessment: Report given to PACU RN and Post -op Vital signs reviewed and stable  Post vital signs: Reviewed and stable  Complications: No apparent anesthesia complications

## 2013-07-10 NOTE — Anesthesia Preprocedure Evaluation (Addendum)
Anesthesia Evaluation  Patient identified by MRN, date of birth, ID band Patient awake    Reviewed: Allergy & Precautions, H&P , NPO status , Patient's Chart, lab work & pertinent test results, reviewed documented beta blocker date and time   Airway Mallampati: II TM Distance: >3 FB Neck ROM: Full    Dental  (+) Dental Advisory Given   Pulmonary neg pulmonary ROS, COPD COPD inhaler, Current Smoker,  breath sounds clear to auscultation+ rhonchi         Cardiovascular hypertension, Pt. on medications and Pt. on home beta blockers Rhythm:Regular Rate:Normal     Neuro/Psych Seizures -,  PSYCHIATRIC DISORDERS Anxiety Depression  Neuromuscular disease negative neurological ROS     GI/Hepatic negative GI ROS, Neg liver ROS,   Endo/Other  negative endocrine ROS  Renal/GU ARFRenal disease     Musculoskeletal  (+) Fibromyalgia -  Abdominal   Peds  Hematology negative hematology ROS (+)   Anesthesia Other Findings Significant tobacco usage  Reproductive/Obstetrics negative OB ROS                          Anesthesia Physical  Anesthesia Plan  ASA: III  Anesthesia Plan: General   Post-op Pain Management:    Induction: Intravenous  Airway Management Planned: LMA  Additional Equipment:   Intra-op Plan:   Post-operative Plan: Extubation in OR  Informed Consent: I have reviewed the patients History and Physical, chart, labs and discussed the procedure including the risks, benefits and alternatives for the proposed anesthesia with the patient or authorized representative who has indicated his/her understanding and acceptance.   Dental advisory given  Plan Discussed with: CRNA  Anesthesia Plan Comments:         Anesthesia Quick Evaluation

## 2013-07-11 ENCOUNTER — Encounter (HOSPITAL_COMMUNITY): Payer: Self-pay | Admitting: Urology

## 2013-07-11 NOTE — Op Note (Signed)
NAME:  Laura Farley, Laura Farley              ACCOUNT NO.:  [id number]  MEDICAL RECORD NO.:  [id number]  LOCATION:  [facility]                         FACILITY:  [facility]  PHYSICIAN:  Excell Seltzer. Annabell Howells, M.D.    DATE OF BIRTH:  1956/04/28  DATE OF PROCEDURE:  07/10/2013 DATE OF DISCHARGE:  07/10/2013                              OPERATIVE REPORT   PROCEDURE:  Cystoscopy, right retrograde pyelogram with interpretation, right renal pelvic washings.  PREOPERATIVE DIAGNOSIS:  Hematuria from the right renal moiety associated with coagulopathy.  POSTOPERATIVE DIAGNOSIS:  Hematuria from the right renal moiety associated with coagulopathy with normal retrograde.  SURGEON:  Excell Seltzer. Annabell Howells, M.D.  ANESTHESIA:  General.  SPECIMEN:  Washings from the right renal pelvis.  DRAINS:  None.  BLOOD LOSS:  None.  COMPLICATIONS:  None.  INDICATIONS:  Ms. Dusenbury is a 57 year old, white female, who was recently admitted to the hospital with coagulopathy and both GI and urinary bleeding.  Office cystoscopy revealed brisk bleeding from the right ureteral orifice.  CT scanning had demonstrated some enhancement of the renal pelvic wall but no filling defects.  It was felt the cystoscopy and attempted ureteroscopy was indicated.  The bleeding has stopped after reversal of coagulopathy.  FINDINGS OF PROCEDURE:  She was given Cipro and taken to the operating room where general anesthetic was induced.  She was placed in lithotomy position.  Her perineum and genitalia were prepped with Betadine solution.  She was draped in usual sterile fashion.  Cystoscopy was performed using the 22-French scope and 12-degree lens.  Examination revealed a normal urethra.  The bladder wall was smooth and pale without tumor, stones, or inflammation.  The ureteral orifices were unremarkable, effluxing clear urine.  The right ureteral orifice was cannulated with a 5-French open-end catheter and contrast was instilled.  This revealed a normal  ureter and intrarenal collecting system without filling defects.  After retrograde pyelography was performed, a Sensor guidewire was passed to the right kidney and the cystoscope was removed.  An attempt was made to pass a 12-French introducer sheath.  The inner core to dilate the ureter, however, was quite a tight fit and I was unable to advance it above the distal ureter.  At this point, upon reflection, I elected that renal pelvic washings would be the most appropriate course of action as opposed to attempted ureteroscopy in the face of her negative retrograde and also benign cytology in the hospital.  At this point, a 5-French open-end catheter was reinserted over the wire.  The wire was removed.  A 10 mL control top syringe was used to irrigate the renal pelvis, free of contrast.  This was then irrigated with saline and several small aliquots to obtain approximately 30 mL of irrigant for cytologic evaluation.  At this point, the gentle distal retrograde was performed to make sure there was no injury to the distal ureter from the dilation attempt. This was unremarkable and the contrast efflux rapidly.  There was minor bleeding from the right ureteral meatus just from the passage of the dilator.  At this point, the bladder was then drained.  The patient was taken down from lithotomy position.  Her anesthetic was  reversed.  She was moved to recovery in stable condition.  There were no complications.     Excell Seltzer. Annabell Howells, M.D.     JJW/MEDQ  D:  07/10/2013  T:  07/11/2013  Job:  [id number]

## 2013-08-08 ENCOUNTER — Telehealth: Payer: Self-pay | Admitting: Hematology & Oncology

## 2013-08-08 NOTE — Telephone Encounter (Signed)
Pt called wanting appointment. She is aware of 8-25 appointment

## 2013-08-11 ENCOUNTER — Telehealth: Payer: Self-pay | Admitting: Hematology & Oncology

## 2013-08-11 ENCOUNTER — Ambulatory Visit: Payer: Medicare Other | Admitting: Hematology & Oncology

## 2013-08-11 ENCOUNTER — Ambulatory Visit: Payer: Medicare Other

## 2013-08-11 ENCOUNTER — Other Ambulatory Visit: Payer: Medicare Other | Admitting: Lab

## 2013-08-11 NOTE — Telephone Encounter (Signed)
Pt was late per Darl Pikes reschedule pt aware of 09-04-13

## 2013-09-04 ENCOUNTER — Ambulatory Visit: Payer: Medicare Other

## 2013-09-04 ENCOUNTER — Other Ambulatory Visit (HOSPITAL_BASED_OUTPATIENT_CLINIC_OR_DEPARTMENT_OTHER): Payer: Medicare Other | Admitting: Lab

## 2013-09-04 ENCOUNTER — Ambulatory Visit (HOSPITAL_BASED_OUTPATIENT_CLINIC_OR_DEPARTMENT_OTHER): Payer: Medicare Other | Admitting: Hematology & Oncology

## 2013-09-04 VITALS — BP 98/47 | HR 50 | Temp 97.6°F | Resp 14 | Ht 61.0 in | Wt 122.0 lb

## 2013-09-04 DIAGNOSIS — K55059 Acute (reversible) ischemia of intestine, part and extent unspecified: Secondary | ICD-10-CM

## 2013-09-04 DIAGNOSIS — D509 Iron deficiency anemia, unspecified: Secondary | ICD-10-CM

## 2013-09-04 LAB — CBC WITH DIFFERENTIAL (CANCER CENTER ONLY)
Eosinophils Absolute: 0.4 10*3/uL (ref 0.0–0.5)
LYMPH#: 2.6 10*3/uL (ref 0.9–3.3)
MONO#: 1 10*3/uL — ABNORMAL HIGH (ref 0.1–0.9)
MONO%: 12.7 % (ref 0.0–13.0)
NEUT#: 4 10*3/uL (ref 1.5–6.5)
Platelets: 259 10*3/uL (ref 145–400)
RBC: 3.3 10*6/uL — ABNORMAL LOW (ref 3.70–5.32)
WBC: 8.1 10*3/uL (ref 3.9–10.0)

## 2013-09-04 LAB — CHCC SATELLITE - SMEAR

## 2013-09-04 LAB — PROTIME-INR (CHCC SATELLITE)
INR: 1.5 — ABNORMAL LOW (ref 2.0–3.5)
Protime: 18 Seconds — ABNORMAL HIGH (ref 10.6–13.4)

## 2013-09-04 NOTE — Progress Notes (Signed)
This office note has been dictated.

## 2013-09-05 LAB — D-DIMER, QUANTITATIVE: D-Dimer, Quant: 0.27 ug/mL-FEU (ref 0.00–0.48)

## 2013-09-16 NOTE — Progress Notes (Signed)
DIAGNOSES: 1. Mesenteric vein thrombosis. 2. Heterozygous for Factor V Leiden mutation. 3. Remote history of stage II adenocarcinoma of the colon.  HISTORY OF PRESENT ILLNESS:  Laura Farley is a nice 57 year old white female.  She has a lot of medical problems.  She was found to have early stage colon cancer back in 2006.  She was seen by Dr. Dalene Carrow.  She had stage II colon cancer.  She decided not to take any chemotherapy for this.  She has had a hysterectomy.  This was back in 1988.  She has had 2 pregnancies.  One to term.  She does have a history of alcohol use.  She stopped, I think, drinking earlier this year.  She apparently has began to have some abdominal pain.  She began to have a hematemesis.  She began to have hematuria and melena.  This was I think back in June.  She was hospitalized.  She was given a cystoscopy. This actually was done in July.  Cystoscopy, I think, was pretty much unremarkable.  She also has had an upper GI.  She had an EGD by Dr. Matthias Hughs.  This did not show any active bleeding, but there was, I think, some small varices.  She did have a CT scan done.  On 6/25, a CT scan showed some esophageal varices.  The liver was minimally inhomogeneous.  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  This was noted on a CT scan done back on June 5th.  She had reduction in the portal vein thrombosis.  On 7/7, no CT scan was done.  This was for abdominal pain.  There is no bowel ischemia.  She had improving mesenteric/portal vein thrombus.  She did have hypercoagulable studies done.  She was found to be heterozygous for Factor V Leiden.  She was placed on anticoagulation.  She is on Coumadin.  She now comes to see [place] and see how we need to have this thrombus managed.  Again, she has been scoped by Dr. Matthias Hughs of GI.  She has been scoped by Dr. Annabell Howells.  It looks like she had been on Coumadin when she may have had the hematuria.  Laura Farley is not too sure about the  timeline.  She just feels rotten.  She feels very tired.  She has a lot of pain in her legs.  She is on quite a few medications that she wants to get off of.  She has not noticed any obvious bleeding since July.  She has had no rashes.  She has had no cough.  She is short of breath.  She has headaches.  She has chronic pain issues.  Overall, her performance status is ECOG 1-2.  Again, she was kindly referred to the [facility] for an evaluation of this mesenteric/portal vein thrombus.  PAST MEDICAL HISTORY:  Remarkable for the: 1. Stage II colon cancer.  She actually did take treatment for this.     She has a chronic neuropathy. 2. Anxiety. 3. Osteoporosis. 4. Depression. 5. Hypertension. 6. "Cirrhosis.". 7. Seizure.  ALLERGIES: 1. Venlafaxine. 2. Bee venom.  MEDICATIONS:  Baclofen 10 mg p.o. b.i.d., Klonopin 2 mg p.o. b.i.d., Neurontin 100 mg p.o. t.i.d., Zestoretic (20/25) 1 p.o. daily, Protonix 40 mg p.o. daily, Paxil 40 mg p.o. daily, ProAir inhaler 1 puff daily, Inderal 20 mg p.o. b.i.d., Aldactone 50 mg p.o. daily, Coumadin 2 mg p.o. daily.  SOCIAL HISTORY:  Remarkable for some tobacco use.  She probably has a 40  pack-year history of tobacco use.  She has history of heavy alcohol use. She stopped drinking, she said, earlier this year.  FAMILY HISTORY:  Noncontributory.  REVIEW OF SYSTEMS:  As stated in history of present illness.  No additional findings noted outside that reported in the history of present illness.  PHYSICAL EXAMINATION:  General:  This is a chronically ill-appearing white female in no obvious distress.  Vital signs:  Temperature of 97.6, pulse 50, respiratory rate 14, blood pressure 98/47.  Weight is 122 pounds.  Head and neck:  Normocephalic, atraumatic skull.  There are no ocular or oral lesions.  There are no palpable cervical or supraclavicular lymph nodes.  She may have some slight conjunctival pallor.  Lungs:  Clear  bilaterally.  She has no rales, wheezes or rhonchi.  Cardiac:  Regular rate and rhythm with a normal S1, S2.  She has no murmurs, rubs or bruits.  Abdomen:  Soft.  She has a laparotomy scar.  There has no fluid wave.  There is no palpable abdominal mass. There is no obvious hepatosplenomegaly.  Back:  No tenderness over the spine, ribs, or hips.  Extremities:  Show no clubbing, cyanosis or edema.  She has no arthritic changes in her joints.  Muscle strength is 4+/5 bilaterally.  She has good pulses in her distal extremities.  Skin: Shows some dry skin.  She has no petechia or ecchymoses.  Neurological: Shows no focal neurological deficits.  LABORATORY STUDIES:  White cell count 8.1, hemoglobin 10.6, hematocrit 31.9, platelet count 259.  MCV is 97.  INR is 1.5.  IMPRESSION:  Laura Farley is a 57 year old white female.  It is hard to get a sense of actually what happened to her and the "timeline."  I am not sure if she has cirrhosis.  She has varices that were seen on endoscopy and on a CT scan.  She does not feel well.  She feels weak.  She is not bleeding.  Her blood pressure is on the low side.  I think that she could probably stop the Zestoretic.  I think she can stop the Aldactone.  I told her to cut the propranolol down in half.  I think she may need to have another upper endoscopy.  We will see Dr. Matthias Hughs can do this.  She has a Factor V Leiden mutation.  This certainly could have been a trigger for the mesenteric vein thrombus.  I really do not think she needs lifelong anticoagulation.  I probably would consider her for 6 months of anticoagulation.  I think one issue is to see about switching her over to Xarelto. Xarelto might be easy for her.  I think she would not have to have her labs checked all the time.  There is no or very limited medicine interactions.  She can eat what she wants.  This is an incredibly tough case.  Again, it is hard to get a sense of the  events that really happened to her.  I just know she does not feel well.  She does not look all that great. She says she feels.  One would think that she does have some element of the cirrhosis with the history of alcohol use and the varices that were seen.  For now, we will just keep her on the Coumadin.  She is managed by her family doctor.  I want see her back in about a month or so.  I spent a good hour and 15 minutes with  her and her husband.  They are both very, very nice.  She is originally from [place], it was nice to talk about the "good old days."    ______________________________ Josph Macho, M.D. PRE/MEDQ  D:  09/04/2013  T:  09/16/2013  Job:  [id number]

## 2013-10-20 ENCOUNTER — Other Ambulatory Visit: Payer: Medicare Other | Admitting: Lab

## 2013-10-20 ENCOUNTER — Ambulatory Visit: Payer: Medicare Other | Admitting: Hematology & Oncology

## 2013-10-23 ENCOUNTER — Telehealth: Payer: Self-pay | Admitting: Hematology & Oncology

## 2013-10-23 NOTE — Telephone Encounter (Signed)
Left message to call to reschedule 11-3

## 2013-11-24 NOTE — Progress Notes (Signed)
DIAGNOSES: 1. Mesenteric vein thrombosis. 2. Heterozygote for factor V Leiden mutation. 3. Remote history of stage II adenocarcinoma of the colon.  HISTORY OF PRESENT ILLNESS:  Laura Farley is a nice 57 year old white female.  She has a lot of medical problems.  She was found to have early stage colon cancer back in 2006.  She was seen by Dr. Dalene Carrow.  She had stage II colon cancer.  She decided not to take any chemotherapy for this.  She has had a hysterectomy.  This was back in 1988.  She has had 2 pregnancies, both went to term.  She does have history of heavy alcohol use.  She stopped drinking earlier this year.  She apparently began to have some abdominal pain.  She began to have hematemesis.  She began to have hematuria and melena.  This was I think back in June.  She was hospitalized.  She was given a cystoscopy.  This actually was done in July.  Cystoscopy, I think, was pretty much unremarkable.  She also has had an upper GI.  She had an EGD by Dr. Matthias Hughs.  This did not show any active bleeding, but there was, I think, some small varices.  She did have a CT scan done. On 06/25 a CT scan showed some esophageal varices.  Liver was minimally inhomogeneous.  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  This was noted on a CT scan done back on 06/05.  She had reduction of the portal vein thrombus.  On 07/07, another CT scan was done.  This was for abdominal pain.  There was no bowel ischemia.  She had improving mesenteric/portal vein thrombus.  She did have hypercoagulable studies done.  She was found to be heterozygous for factor V Leiden.  She was placed on anticoagulation.  She was on Coumadin.  She now comes to see [place] to see how we need to have this thrombus managed.  Again, she has been scoped by Dr. Matthias Hughs of GI.  She was scoped by Dr. Annabell Howells.  It looks like she had been on Coumadin when she may have had the hematuria.  Laura Farley was not too sure about the  timeline.  She just feels rotten.  She feels very tired.  She has a lot of pain in her legs.  She is on quite a few medications that she wants to get off of.  She has not noticed any obvious bleeding since July.  She has had no rashes.  She has had no cough.  She is short of breath.  She has headaches.  She has chronic pain issues.  Overall, her performance status is ECOG 1-2.  Again, she was kindly referred to the [facility] for an evaluation of this mesenteric/portal vein thrombus.  PAST MEDICAL HISTORY:  Remarkable for: 1. Stage II colon cancer.  She actually did take treatment for this.     She has, I think, chronic neuropathy. 2. Anxiety. 3. Osteoporosis. 4. Depression. 5. Hypertension. 6. "Cirrhosis.". 7. Seizure.  ALLERGIES: 1. Venlafaxine. 2. Bee venom.  MEDICATIONS:  Baclofen 10 mg p.o. b.i.d., Klonopin 2 mg p.o. b.i.d., Neurontin 100 mg p.o. t.i.d., Zestoretic (20/25) one p.o. daily, Protonix 40 mg p.o. daily, Paxil 40 mg p.o. daily, ProAir inhaler 1 puff daily, Inderal 20 mg p.o. b.i.d., Aldactone 50 mg p.o. daily, Coumadin 2 mg p.o. daily.  SOCIAL HISTORY:  Remarkable for some tobacco use.  She probably has a 40- pack-year history of tobacco use.  She  has history of heavy alcohol use. She stopped drinking she said earlier this year.  FAMILY HISTORY:  Noncontributory.  REVIEW OF SYSTEMS:  As stated in history of present illness.  No additional findings noted outside that reported in the history of present illness.  PHYSICAL EXAMINATION:  General:  This is a chronically ill appearing white female in no obvious distress.  Vital Signs:  Temperature of 97.6, pulse 50, respiratory rate 14, blood pressure 98/47, weight is 122 pounds.  Head and neck:  Normocephalic, atraumatic skull.  There are no ocular or oral lesions.  There are no palpable cervical, or supraclavicular lymph nodes.  She may have some slight conjunctival pallor.  Lungs:   Clear bilaterally.  She has no rales, wheezes, or rhonchi.  Cardiac:  Regular rate and rhythm with a normal S1, S2.  She has no murmurs, rubs or bruits.  Abdomen:  Soft.  She has a laparotomy scar.  She has no fluid wave.  There is no palpable abdominal mass. There is no obvious hepatosplenomegaly.  Back:  No tenderness over the spine, ribs, or hips.  Extremities:  No clubbing, cyanosis, or edema. She has no arthritic changes in her joints.  Muscle strength is 4+/5 bilaterally.  She has good pulses in her distal extremities.  Skin: Some dry skin.  She has no petechia or ecchymoses.  Neurologic:  No focal neurological deficits.  LABORATORY STUDIES:  White cell count 8.1, hemoglobin 10.6, hematocrit 31.9, platelet count 259.  MCV is 97.  INR is 1.5.  IMPRESSION:  Laura Farley is a 57 year old white female.  It is hard to get a sense as to actually what happened to her and the "timeline."  I am not sure if she has cirrhosis.  She has varices that were seen on endoscopy and on CT scan.  She just does not feel well.  She feels weak.  She is not bleeding.  Her blood pressure is on the low side.  I think that she can probably stop the Zestoretic.  I think she can stop the Aldactone.  I told her to cut the propranolol down in half.  I think she may need to have another upper endoscopy.  We will see if Dr. Matthias Hughs can do this.  She has a factor V Leiden mutation.  This certainly could have been a trigger for the mesenteric vein thrombus.  I really do not think she needs lifelong anticoagulation.  I will probably consider her for 6 months of anticoagulation.  I think one issue is to see about switching her over to Xarelto. Xarelto might be easier for her.  I think she will not have to have her labs checked all the time.  There is no or very limited medicine interactions.  She can eat what she wants.  This is an incredibly tough case.  Again, it is hard to get a sense of the events that  really happened to her.  I just know that she does not feel well.  She does not look all that great.  She says this is how she feels.  One would think that she does have some element of cirrhosis with the history of heavy alcohol use and the varices that were seen.  For now, we will just keep her on the Coumadin.  She has been managed by her family doctor.  I want to see her back in about a month or so.  I spent a good hour and 15 minutes with her  and her husband.  They both are very nice.  She is originally from [place], so it was nice to talk about the "good old days."    ______________________________ Josph Macho, M.D. PRE/MEDQ  D:  09/04/2013  T:  11/23/2013  Job:  [id number]

## 2014-01-17 ENCOUNTER — Encounter (HOSPITAL_COMMUNITY): Payer: Self-pay | Admitting: Emergency Medicine

## 2014-01-17 ENCOUNTER — Emergency Department (HOSPITAL_COMMUNITY)
Admission: EM | Admit: 2014-01-17 | Discharge: 2014-01-18 | Disposition: E | Payer: Medicare Other | Attending: Emergency Medicine | Admitting: Emergency Medicine

## 2014-01-17 ENCOUNTER — Emergency Department (HOSPITAL_COMMUNITY): Payer: Medicare Other

## 2014-01-17 ENCOUNTER — Other Ambulatory Visit (HOSPITAL_COMMUNITY): Payer: Medicare Other

## 2014-01-17 DIAGNOSIS — F17200 Nicotine dependence, unspecified, uncomplicated: Secondary | ICD-10-CM | POA: Insufficient documentation

## 2014-01-17 DIAGNOSIS — Z862 Personal history of diseases of the blood and blood-forming organs and certain disorders involving the immune mechanism: Secondary | ICD-10-CM | POA: Insufficient documentation

## 2014-01-17 DIAGNOSIS — I472 Ventricular tachycardia, unspecified: Secondary | ICD-10-CM

## 2014-01-17 DIAGNOSIS — I4729 Other ventricular tachycardia: Secondary | ICD-10-CM | POA: Insufficient documentation

## 2014-01-17 DIAGNOSIS — F411 Generalized anxiety disorder: Secondary | ICD-10-CM | POA: Insufficient documentation

## 2014-01-17 DIAGNOSIS — I129 Hypertensive chronic kidney disease with stage 1 through stage 4 chronic kidney disease, or unspecified chronic kidney disease: Secondary | ICD-10-CM | POA: Insufficient documentation

## 2014-01-17 DIAGNOSIS — R4182 Altered mental status, unspecified: Secondary | ICD-10-CM

## 2014-01-17 DIAGNOSIS — G40909 Epilepsy, unspecified, not intractable, without status epilepticus: Secondary | ICD-10-CM | POA: Insufficient documentation

## 2014-01-17 DIAGNOSIS — R092 Respiratory arrest: Secondary | ICD-10-CM

## 2014-01-17 DIAGNOSIS — Z86718 Personal history of other venous thrombosis and embolism: Secondary | ICD-10-CM | POA: Insufficient documentation

## 2014-01-17 DIAGNOSIS — R231 Pallor: Secondary | ICD-10-CM | POA: Insufficient documentation

## 2014-01-17 DIAGNOSIS — R55 Syncope and collapse: Secondary | ICD-10-CM | POA: Insufficient documentation

## 2014-01-17 DIAGNOSIS — I469 Cardiac arrest, cause unspecified: Secondary | ICD-10-CM

## 2014-01-17 DIAGNOSIS — Z8719 Personal history of other diseases of the digestive system: Secondary | ICD-10-CM | POA: Insufficient documentation

## 2014-01-17 DIAGNOSIS — Z85038 Personal history of other malignant neoplasm of large intestine: Secondary | ICD-10-CM | POA: Insufficient documentation

## 2014-01-17 DIAGNOSIS — Z859 Personal history of malignant neoplasm, unspecified: Secondary | ICD-10-CM

## 2014-01-17 DIAGNOSIS — Z79899 Other long term (current) drug therapy: Secondary | ICD-10-CM | POA: Insufficient documentation

## 2014-01-17 DIAGNOSIS — Z8542 Personal history of malignant neoplasm of other parts of uterus: Secondary | ICD-10-CM | POA: Insufficient documentation

## 2014-01-17 DIAGNOSIS — N186 End stage renal disease: Secondary | ICD-10-CM | POA: Insufficient documentation

## 2014-01-17 DIAGNOSIS — G709 Myoneural disorder, unspecified: Secondary | ICD-10-CM | POA: Insufficient documentation

## 2014-01-17 DIAGNOSIS — F3289 Other specified depressive episodes: Secondary | ICD-10-CM | POA: Insufficient documentation

## 2014-01-17 DIAGNOSIS — F329 Major depressive disorder, single episode, unspecified: Secondary | ICD-10-CM | POA: Insufficient documentation

## 2014-01-17 DIAGNOSIS — Z7901 Long term (current) use of anticoagulants: Secondary | ICD-10-CM | POA: Insufficient documentation

## 2014-01-17 DIAGNOSIS — D689 Coagulation defect, unspecified: Secondary | ICD-10-CM

## 2014-01-17 LAB — TYPE AND SCREEN
ABO/RH(D): O POS
Antibody Screen: NEGATIVE
Unit division: 0
Unit division: 0

## 2014-01-17 LAB — CBC WITH DIFFERENTIAL/PLATELET
Basophils Absolute: 0 K/uL (ref 0.0–0.1)
Basophils Relative: 0 % (ref 0–1)
Eosinophils Absolute: 0 10*3/uL (ref 0.0–0.7)
Eosinophils Relative: 0 % (ref 0–5)
HCT: 20.9 % — ABNORMAL LOW (ref 36.0–46.0)
Hemoglobin: 7.3 g/dL — ABNORMAL LOW (ref 12.0–15.0)
Lymphocytes Relative: 24 % (ref 12–46)
Lymphs Abs: 3.2 10*3/uL (ref 0.7–4.0)
MCH: 33.3 pg (ref 26.0–34.0)
MCHC: 34.9 g/dL (ref 30.0–36.0)
MCV: 95.4 fL (ref 78.0–100.0)
Monocytes Absolute: 0.9 10*3/uL (ref 0.1–1.0)
Monocytes Relative: 7 % (ref 3–12)
Neutro Abs: 9.2 10*3/uL — ABNORMAL HIGH (ref 1.7–7.7)
Neutrophils Relative %: 69 % (ref 43–77)
Platelets: 341 10*3/uL (ref 150–400)
RBC: 2.19 MIL/uL — ABNORMAL LOW (ref 3.87–5.11)
RDW: 16.6 % — ABNORMAL HIGH (ref 11.5–15.5)
WBC: 13.3 K/uL — ABNORMAL HIGH (ref 4.0–10.5)

## 2014-01-17 LAB — COMPREHENSIVE METABOLIC PANEL WITH GFR
ALT: 1110 U/L — ABNORMAL HIGH (ref 0–35)
AST: 1233 U/L — ABNORMAL HIGH (ref 0–37)
Albumin: 2.3 g/dL — ABNORMAL LOW (ref 3.5–5.2)
Alkaline Phosphatase: 84 U/L (ref 39–117)
BUN: 48 mg/dL — ABNORMAL HIGH (ref 6–23)
Chloride: 94 meq/L — ABNORMAL LOW (ref 96–112)
Potassium: 4.8 meq/L (ref 3.7–5.3)
Sodium: 141 meq/L (ref 137–147)

## 2014-01-17 LAB — POCT I-STAT, CHEM 8
BUN: 58 mg/dL — ABNORMAL HIGH (ref 6–23)
Calcium, Ion: 1.04 mmol/L — ABNORMAL LOW (ref 1.12–1.23)
Chloride: 101 meq/L (ref 96–112)
Creatinine, Ser: 2.9 mg/dL — ABNORMAL HIGH (ref 0.50–1.10)
Glucose, Bld: 130 mg/dL — ABNORMAL HIGH (ref 70–99)
HCT: 21 % — ABNORMAL LOW (ref 36.0–46.0)
Hemoglobin: 7.1 g/dL — ABNORMAL LOW (ref 12.0–15.0)
Potassium: 4.5 mEq/L (ref 3.7–5.3)
Sodium: 136 mEq/L — ABNORMAL LOW (ref 137–147)
TCO2: 13 mmol/L (ref 0–100)

## 2014-01-17 LAB — POCT I-STAT 3, ART BLOOD GAS (G3+)
Acid-base deficit: 23 mmol/L — ABNORMAL HIGH (ref 0.0–2.0)
Bicarbonate: 9.6 meq/L — ABNORMAL LOW (ref 20.0–24.0)
O2 Saturation: 98 %
TCO2: 12 mmol/L (ref 0–100)
pCO2 arterial: 64.1 mmHg (ref 35.0–45.0)
pH, Arterial: 6.784 — CL (ref 7.350–7.450)
pO2, Arterial: 203 mmHg — ABNORMAL HIGH (ref 80.0–100.0)

## 2014-01-17 LAB — LACTIC ACID, PLASMA: Lactic Acid, Venous: 20.6 mmol/L — ABNORMAL HIGH (ref 0.5–2.2)

## 2014-01-17 LAB — COMPREHENSIVE METABOLIC PANEL
CO2: 10 mEq/L — CL (ref 19–32)
Calcium: 8.6 mg/dL (ref 8.4–10.5)
Creatinine, Ser: 2.87 mg/dL — ABNORMAL HIGH (ref 0.50–1.10)
GFR calc Af Amer: 20 mL/min — ABNORMAL LOW (ref >90)
GFR calc non Af Amer: 17 mL/min — ABNORMAL LOW (ref >90)
Glucose, Bld: 147 mg/dL — ABNORMAL HIGH (ref 70–99)
Total Bilirubin: 0.7 mg/dL (ref 0.3–1.2)
Total Protein: 4.8 g/dL — ABNORMAL LOW (ref 6.0–8.3)

## 2014-01-17 LAB — PREPARE FRESH FROZEN PLASMA: Unit division: 0

## 2014-01-17 LAB — POCT I-STAT TROPONIN I: Troponin i, poc: 0.19 ng/mL (ref 0.00–0.08)

## 2014-01-17 LAB — APTT: aPTT: 175 seconds — ABNORMAL HIGH (ref 24–37)

## 2014-01-17 LAB — PROTIME-INR
INR: >10 (ref 0.00–1.49)
Prothrombin Time: >90 seconds — ABNORMAL HIGH (ref 11.6–15.2)

## 2014-01-17 LAB — TROPONIN I: Troponin I: 0.66 ng/mL (ref <0.30)

## 2014-01-17 LAB — GLUCOSE, CAPILLARY: Glucose-Capillary: 138 mg/dL — ABNORMAL HIGH (ref 70–99)

## 2014-01-17 LAB — PREPARE RBC (CROSSMATCH)

## 2014-01-17 MED ORDER — SODIUM BICARBONATE 8.4 % IV SOLN
INTRAVENOUS | Status: AC
  Administered 2014-01-17: 50 meq via INTRAVENOUS
  Filled 2014-01-17: qty 50

## 2014-01-17 MED ORDER — SODIUM BICARBONATE 8.4 % IV SOLN
INTRAVENOUS | Status: AC | PRN
  Administered 2014-01-17 (×4): 50 meq via INTRAVENOUS

## 2014-01-17 MED ORDER — SODIUM BICARBONATE 8.4 % IV SOLN
INTRAVENOUS | Status: AC
  Filled 2014-01-17: qty 50

## 2014-01-17 MED ORDER — DOPAMINE-DEXTROSE 3.2-5 MG/ML-% IV SOLN
INTRAVENOUS | Status: AC | PRN
  Administered 2014-01-17: 08:00:00 via INTRAVENOUS

## 2014-01-17 MED ORDER — MIDAZOLAM HCL 2 MG/2ML IJ SOLN
2.0000 mg | Freq: Once | INTRAMUSCULAR | Status: AC
  Administered 2014-01-17: 2 mg via INTRAVENOUS
  Filled 2014-01-17: qty 2

## 2014-01-17 MED ORDER — ATROPINE SULFATE 1 MG/ML IJ SOLN
INTRAMUSCULAR | Status: AC | PRN
  Administered 2014-01-17 (×2): 1 mg via INTRAVENOUS

## 2014-01-17 MED ORDER — EPINEPHRINE HCL 0.1 MG/ML IJ SOSY
PREFILLED_SYRINGE | INTRAMUSCULAR | Status: AC | PRN
  Administered 2014-01-17 (×4): 0.1 mg via INTRAVENOUS

## 2014-01-17 MED ORDER — PROPOFOL 10 MG/ML IV EMUL
5.0000 ug/kg/min | INTRAVENOUS | Status: DC
  Filled 2014-01-17: qty 100

## 2014-01-17 MED ORDER — FENTANYL CITRATE 0.05 MG/ML IJ SOLN
50.0000 ug | Freq: Once | INTRAMUSCULAR | Status: AC
  Administered 2014-01-17: 50 ug via INTRAVENOUS
  Filled 2014-01-17: qty 2

## 2014-01-18 MED FILL — Medication: Qty: 1 | Status: AC

## 2014-01-18 NOTE — ED Notes (Signed)
High quality COR paused to assess cardiac rate

## 2014-01-18 NOTE — ED Notes (Signed)
Family found PT unresponsive at approx. 0700 this AM. Lzast seen A/O last night. Pt unresponsive on arrival to ED. P fixed and dilated. CPR started at 0753.

## 2014-01-18 NOTE — Code Documentation (Signed)
High Quality CPR paused to assess cardiac status.

## 2014-01-18 NOTE — ED Notes (Signed)
DR Coletta Memos is PT PCP will sign death cert.

## 2014-01-18 NOTE — Progress Notes (Signed)
Chaplain paged for family support for patient brought in on CPR. Presented to pt's husband, escorted him to consultation room A, where chaplain offered him emotional support, empathic listening and hospitality. Remained with family during discussions with physician. Chaplain present with husband and son when patient died in trauma room C. Remained with family until they left the hospital, providing hospitable and caring presence, listening empathically to their pain and anger, offering emotional and spiritual support and prayer, providing crisis and grief support, and acting as liaison between family and staff.   Gathered contact information for next of kin and delivered to RN.   Pt's husband has Patient Placement Card to call with funeral home information.   Family was grateful for chaplain support and the care of the emergency room medical team.   Ethelene Browns [phone]

## 2014-01-18 NOTE — ED Notes (Signed)
High quality CPR resumed

## 2014-01-18 NOTE — ED Notes (Signed)
High quality CPR paused to assess cardiac rate. Rate indicated cardiac shock needed. EDP Ghim at bed side.

## 2014-01-18 NOTE — Code Documentation (Signed)
High Quality CPR paused to assess rate ,shock delivered per EDP. And high quality CPR started.

## 2014-01-18 NOTE — ED Notes (Signed)
High quality CPR in progress

## 2014-01-18 NOTE — Code Documentation (Addendum)
Patient time of death occurred at 110 .Family at bedside with EDP Ghim time of death 28

## 2014-01-18 NOTE — ED Notes (Signed)
High quality CPR paused , [place] for cardiac activity Per EDP Ghim

## 2014-01-18 NOTE — Code Documentation (Addendum)
PT HR 30 , High quality CPR started EDP and DR Nelda Marseille  At bed side.

## 2014-01-18 NOTE — Code Documentation (Signed)
Family updated as to patient's status.

## 2014-01-18 NOTE — ED Notes (Signed)
MD at bedside.EDP Ghim

## 2014-01-18 NOTE — Code Documentation (Signed)
Unable to add propofol due to SBP less  than 80

## 2014-01-18 NOTE — ED Notes (Signed)
Eye prep done

## 2014-01-18 NOTE — ED Notes (Signed)
High quality CPR restarted

## 2014-01-18 NOTE — ED Provider Notes (Addendum)
CSN: [id number]     Arrival date & time 12/22/2013  0753 History   First MD Initiated Contact with Patient 12/22/2013 0800     Chief Complaint  Patient presents with  . Respiratory Arrest   (Consider location/radiation/quality/duration/timing/severity/associated sxs/prior Treatment) HPI Comments: Pt was unresponsive per spouse.  Called EMS for possible seizure as pt has a history.  Pt was not breathing well per EMS, was bagging en route.  Pt was breathing on own initially.  Upon arrival, pt was unresponsive, not breathing on own, no palpable pulses, sinus brady on monitor at rate 40  Patient is a 58 y.o. female presenting with syncope. The history is provided by the EMS personnel. The history is limited by the condition of the patient.  Loss of Consciousness Episode history:  Unable to specify Timing:  Constant Progression:  Worsening Witnessed: no     Past Medical History  Diagnosis Date  . Hypertension   . Tobacco use disorder   . Anxiety   . Depression   . Seizures     withdraw from xanax - 3 OR 4 YRS AGO  . Anemia JULY 2014    GI BLEED SECONDARY TO BLOOD THINNERS LOVENOX, WARFARIN  . Thrombosis of mesenteric vein JUNE 2014    PT HOSPITALIZED AT [facility] AND PUT ON BLOOD THINNERS LOVENOX AND WARFARIN--PT STILL HAVING ABDOMINAL PAIN, GAS AND BLOATING - GI DOCTOR IS DR. Cristina Gong  . Colitis JULY 2014    PT HOSPITALIZED AT [facility] FOR GASTROENTERITIS AND COLITIS  . History of GI bleed JULY 2014    HOSPITALIZED WITH GI BLEED SECONDARY TO WARFARIN - RECEIVED BLOOD AND FRESH FROZEN PLASMA  . Chronic kidney disease     ACUTE RENAL FAILURE JULY 2014 WHILE HOSP WITH GI BLEED AND RIGHT RENAL HEMORRHAGE;  HEMATURIA HAS RESOLVED; HAS URINARY FRQUENCY AND NOCTURIA  . Neuromuscular disorder     NUMBNESS FEET AND HANDS SINCE CHEMO TX'S  . Cancer     Right colon cancer T3  S/P SURGERY AND CHEMO  . Colon cancer   . Colon cancer   . Uterine cancer    Past Surgical History  Procedure Laterality Date   . Colon surgery  2006  . Abdominal hysterectomy  1988    cancer  . Broken arm  2012    left  . Clavicle surgery  2012    left  . Tonsillectomy    . Tunneled venous port placement  2006  . Port-a-cath removal  10/31/2011    Procedure: REMOVAL PORT-A-CATH;  Surgeon: Gayland Curry, MD;  Location: [place];  Service: General;  Laterality: N/A;  port-a-cath removal  . Esophagogastroduodenoscopy  02/10/2012    Procedure: ESOPHAGOGASTRODUODENOSCOPY (EGD);  Surgeon: Beryle Beams, MD;  Location: [facility] ENDOSCOPY;  Service: Endoscopy;  Laterality: N/A;  . Esophagogastroduodenoscopy N/A 07/02/2013    Procedure: ESOPHAGOGASTRODUODENOSCOPY (EGD);  Surgeon: Cleotis Nipper, MD;  Location: [facility] ENDOSCOPY;  Service: Endoscopy;  Laterality: N/A;  . Cystoscopy with retrograde pyelogram, ureteroscopy and stent placement Right 07/10/2013    Procedure: CYSTOSCOPY WITH RIGHT RETROGRADE PYELOGRAM;  Surgeon: Malka So, MD;  Location: [facility];  Service: Urology;  Laterality: Right;   Family History  Problem Relation Age of Onset  . Cancer Father     prostate  . Hypertension Sister      X  2  . Hypertension Brother    History  Substance Use Topics  . Smoking status: Current Every Day Smoker -- 0.50 packs/day for  30 years    Types: Cigarettes  . Smokeless tobacco: Never Used  . Alcohol Use: No     Comment: stated she quit   OB History   Grav Para Term Preterm Abortions TAB SAB Ect Mult Living                 Review of Systems  Unable to perform ROS: Acuity of condition  Cardiovascular: Positive for syncope.    Allergies  Bee venom and Venlafaxine hcl  Home Medications   Current Outpatient Rx  Name  Route  Sig  Dispense  Refill  . baclofen (LIORESAL) 10 MG tablet   Oral   Take 10 mg by mouth 2 (two) times daily. 1/2 tab         . Calcium Carbonate-Vitamin D (CALCIUM + D PO)   Oral   Take 1 tablet by mouth every evening.         . clonazePAM (KLONOPIN) 2 MG tablet    Oral   Take 2 mg by mouth 2 (two) times daily as needed for anxiety.          . feeding supplement (ENSURE COMPLETE) LIQD   Oral   Take 237 mLs by mouth 2 (two) times daily between meals.   60 Bottle   0   . gabapentin (NEURONTIN) 100 MG capsule   Oral   Take 100 mg by mouth 3 (three) times daily.          Marland Kitchen lisinopril-hydrochlorothiazide (PRINZIDE,ZESTORETIC) 20-25 MG per tablet   Oral   Take 1 tablet by mouth daily.          . Multiple Vitamin (MULTIVITAMIN WITH MINERALS) TABS   Oral   Take 1 tablet by mouth daily.   30 tablet   0   . pantoprazole (PROTONIX) 40 MG tablet   Oral   Take 40 mg by mouth daily.         Marland Kitchen PARoxetine (PAXIL) 40 MG tablet   Oral   Take 40 mg by mouth every morning.         . polyethylene glycol powder (GLYCOLAX/MIRALAX) powder   Oral   Take 17 g by mouth as needed.          Marland Kitchen PROAIR HFA 108 (90 BASE) MCG/ACT inhaler   Inhalation   Inhale 1 puff into the lungs as needed.          . propranolol (INDERAL) 20 MG tablet   Oral   Take 1 tablet (20 mg total) by mouth 2 (two) times daily.   60 tablet   0   . spironolactone (ALDACTONE) 50 MG tablet   Oral   Take 1 tablet (50 mg total) by mouth daily.   30 tablet   0   . warfarin (COUMADIN) 2 MG tablet   Oral   Take 2 mg by mouth daily.           BP 125/103  Pulse 127  Resp 21  Ht 5\' 3"  (1.6 m)  Wt 121 lb 14.6 oz (55.3 kg)  BMI 21.60 kg/m2  SpO2 64% Physical Exam  Nursing note and vitals reviewed. Constitutional: She appears well-developed and well-nourished. She appears toxic. Face mask in place.  HENT:  Head: Normocephalic and atraumatic.  Neck: Neck supple. JVD present.  Cardiovascular:  No pulses   Pulmonary/Chest: She has no rales.  No spontaneous breathing  Abdominal: Soft. She exhibits no distension. There is no tenderness.  Neurological: She is unresponsive.  She exhibits abnormal muscle tone.  Skin: There is pallor.    ED Course  Angiocath  insertion Date/Time: 2014/01/22 8:00 AM Performed by: Donzetta Matters. Authorized by: Donzetta Matters Consent: Verbal consent not obtained. written consent not obtained. The procedure was performed in an emergent situation. Time out: Immediately prior to procedure a "time out" was called to verify the correct patient, procedure, equipment, support staff and site/side marked as required. Preparation: Patient was prepped and draped in the usual sterile fashion. Local anesthesia used: no Patient sedated: no Patient tolerance: Patient tolerated the procedure well with no immediate complications. Comments: 18 gauge EJ placed on left on first attempt  IO LINE INSERTION Date/Time: 2014-01-22 7:50 AM Performed by: Donzetta Matters. Authorized by: Donzetta Matters Consent: Verbal consent not obtained. written consent not obtained. The procedure was performed in an emergent situation. Time out: Immediately prior to procedure a "time out" was called to verify the correct patient, procedure, equipment, support staff and site/side marked as required. Indications: rapid vascular access Local anesthesia used: no Patient sedated: no Insertion site: right proximal tibia Site preparation: alcohol Insertion device: drill device Insertion: needle was inserted through the bony cortex Number of attempts: 1 Confirmation method: stability of the needle and easy infusion of fluids Secured with: gauze dressing Patient tolerance: Patient tolerated the procedure well with no immediate complications. INTUBATION Date/Time: 01/22/14 7:55 AM Performed by: Donzetta Matters Authorized by: Donzetta Matters Consent: Verbal consent not obtained. written consent not obtained. The procedure was performed in an emergent situation. Patient identity confirmed: hospital-assigned identification number Time out: Immediately prior to procedure a "time out" was called to verify the correct patient, procedure, equipment, support  staff and site/side marked as required. Indications: respiratory failure, airway protection and hypoxemia Intubation method: fiberoptic oral Patient status: unconscious Preoxygenation: BVM Pretreatment medications: none Laryngoscope size: Mac 3 Tube size: 7.0 mm Tube type: cuffed Number of attempts: 1 Cricoid pressure: yes Cords visualized: yes Post-procedure assessment: chest rise and CO2 detector Breath sounds: equal Cuff inflated: yes Tube secured with: ETT holder Chest x-ray interpreted by radiologist. Chest x-ray findings: endotracheal tube in appropriate position Patient tolerance: Patient tolerated the procedure well with no immediate complications.  CENTRAL LINE Date/Time: 01-22-14 10:13 AM Performed by: Donzetta Matters Authorized by: Donzetta Matters Consent: The procedure was performed in an emergent situation. Consent given by: spouse Patient identity confirmed: hospital-assigned identification number Time out: Immediately prior to procedure a "time out" was called to verify the correct patient, procedure, equipment, support staff and site/side marked as required. Indications: vascular access Anesthesia: local infiltration Local anesthetic: lidocaine 1% without epinephrine Anesthetic total: 2 ml Preparation: skin prepped with 2% chlorhexidine Skin prep agent dried: skin prep agent completely dried prior to procedure Sterile barriers: all five maximum sterile barriers used - cap, mask, sterile gown, sterile gloves, and large sterile sheet Hand hygiene: hand hygiene performed prior to central venous catheter insertion Location details: right internal jugular Patient position: flat Catheter type: triple lumen Pre-procedure: landmarks identified Ultrasound guidance: yes Number of attempts: 1 Successful placement: yes Post-procedure: line sutured and dressing applied Assessment: blood return through all ports and free fluid flow Patient tolerance: Patient  tolerated the procedure well with no immediate complications.   (including critical care time)    CRITICAL CARE Performed by: Donzetta Matters. Total critical care time: 50 min Critical care time was exclusive of separately billable procedures and treating other patients. Critical care was necessary to treat or  prevent imminent or life-threatening deterioration. Critical care was time spent personally by me on the following activities: development of treatment plan with patient and/or surrogate as well as nursing, discussions with consultants, evaluation of patient's response to treatment, examination of patient, obtaining history from patient or surrogate, ordering and performing treatments and interventions, ordering and review of laboratory studies, ordering and review of radiographic studies, pulse oximetry and re-evaluation of patient's condition.    Labs Review Labs Reviewed  CBC WITH DIFFERENTIAL - Abnormal; Notable for the following:    WBC 13.3 (*)    RBC 2.19 (*)    Hemoglobin 7.3 (*)    HCT 20.9 (*)    RDW 16.6 (*)    Neutro Abs 9.2 (*)    All other components within normal limits  COMPREHENSIVE METABOLIC PANEL - Abnormal; Notable for the following:    Chloride 94 (*)    CO2 10 (*)    Glucose, Bld 147 (*)    BUN 48 (*)    Creatinine, Ser 2.87 (*)    Total Protein 4.8 (*)    Albumin 2.3 (*)    AST 1233 (*)    ALT 1110 (*)    GFR calc non Af Amer 17 (*)    GFR calc Af Amer 20 (*)    All other components within normal limits  TROPONIN I - Abnormal; Notable for the following:    Troponin I 0.66 (*)    All other components within normal limits  APTT - Abnormal; Notable for the following:    aPTT 175 (*)    All other components within normal limits  PROTIME-INR - Abnormal; Notable for the following:    Prothrombin Time >90.0 (*)    INR >10.00 (*)    All other components within normal limits  LACTIC ACID, PLASMA - Abnormal; Notable for the following:    Lactic Acid,  Venous 20.6 (*)    All other components within normal limits  GLUCOSE, CAPILLARY - Abnormal; Notable for the following:    Glucose-Capillary 138 (*)    All other components within normal limits  POCT I-STAT, CHEM 8 - Abnormal; Notable for the following:    Sodium 136 (*)    BUN 58 (*)    Creatinine, Ser 2.90 (*)    Glucose, Bld 130 (*)    Calcium, Ion 1.04 (*)    Hemoglobin 7.1 (*)    HCT 21.0 (*)    All other components within normal limits  POCT I-STAT TROPONIN I - Abnormal; Notable for the following:    Troponin i, poc 0.19 (*)    All other components within normal limits  POCT I-STAT 3, BLOOD GAS (G3+) - Abnormal; Notable for the following:    pH, Arterial 6.784 (*)    pCO2 arterial 64.1 (*)    pO2, Arterial 203.0 (*)    Bicarbonate 9.6 (*)    Acid-base deficit 23.0 (*)    All other components within normal limits  URINALYSIS, ROUTINE W REFLEX MICROSCOPIC  TYPE AND SCREEN   Imaging Review Dg Chest Port 1 View  01/30/2014   CLINICAL DATA:  Unresponsive. Acute respiratory failure. Colon cancer.  EXAM: PORTABLE CHEST - 1 VIEW  COMPARISON:  07/09/2013  FINDINGS: Endotracheal tube is seen with tip approximately 4.5 cm above the carina. Both lungs are clear. Heart size is normal.  IMPRESSION: Endotracheal tube in appropriate position.  No active lung disease.   Electronically Signed   By: Earle Gell M.D.   On:  02/09/14 09:04    EKG Interpretation    Date/Time:  2014/02/09 08:10:06 EST Ventricular Rate:  137 PR Interval:    QRS Duration: 129 QT Interval:  367 QTC Calculation: 554 R Axis:   -52 Text Interpretation:  Atrial fibrillation , new RBBB and LAFB Abnrm T, probable ischemia, anterolateral lds Abnormal ekg Confirmed by [facility]  MD, MICHEAL ([id number]) on February 09, 2014 10:22:43 AM           10:39 AM After stabilizing somewhat, pt again arrested, attempts at resuscitation again going for 15 minutes with epi, atropine, defibrillation, sodium bicarb was not  successful.  Pt with asystole/PEA at rate 15 as final rhythm.  No spontaneous movements.  Pt called at 10:36 AM.  Pt's family at bedside.  In sum, cpr for greater than 50 minutes.    MDM   1. Respiratory arrest   2. Cardiac arrest   3. Ventricular tachycardia   4. Coagulopathy   5. History of cancer   6. Altered mental status      Upon arrival, no pulses, CPR initiated.  ETT placed, no access, IO placed initially, given epi.  Glucsose was 180 at scene per EMS.  Sodium bicarb given as I suspected pt may have arrested sometime prior.  Pupils were fixed and dilated initially, very cold to touch.  Then left EJ placed.  Pt is on coumdin for recently found clot, per EMS, has a h/o stomach adn colon cancer per EMS.    Pt with V tach after initial resuscitation, required CPR adn 2 defibrillation attempts, ROSC.  Dopamine gtt, warming.  Labs, CXR, head CT and have consulted intensivist, Dr. Nelda Marseille.  Continued poor access, attempted femoral line on right, unable, accessed artery.  Then attempted right IJ by U/S guyidance which was successful.  Troponin leak.  Pt's pH is still low, also INR is >10.  Hgb is 7.  Will give FFP and Hgb and will need to go to the ICU.  Pt's spouse for now asks for full code.        Saddie Benders. Dorna Mai, MD February 09, 2014 [place] Dorna Mai, MD 2014/02/09 [place], MD February 09, 2014 8101

## 2014-01-18 NOTE — Code Documentation (Signed)
Shock delivered per EDP. High quality CPR resumed.

## 2014-01-18 NOTE — ED Notes (Signed)
delivered one cardiac shock and resumed high quality CPR

## 2014-01-18 DEATH — deceased

## 2014-02-15 DEATH — deceased
